# Patient Record
Sex: Female | Born: 1935 | Race: White | Hispanic: No | Marital: Married | Smoking: Former smoker
Health system: Southern US, Community
[De-identification: ages and names within clinical notes are randomized; demographics above are authoritative.]

## PROBLEM LIST (undated history)

## (undated) DIAGNOSIS — R413 Other amnesia: Secondary | ICD-10-CM

## (undated) DIAGNOSIS — F0280 Dementia in other diseases classified elsewhere without behavioral disturbance: Secondary | ICD-10-CM

## (undated) DIAGNOSIS — M1990 Unspecified osteoarthritis, unspecified site: Secondary | ICD-10-CM

## (undated) DIAGNOSIS — R55 Syncope and collapse: Secondary | ICD-10-CM

## (undated) DIAGNOSIS — J189 Pneumonia, unspecified organism: Secondary | ICD-10-CM

## (undated) DIAGNOSIS — C50912 Malignant neoplasm of unspecified site of left female breast: Secondary | ICD-10-CM

## (undated) DIAGNOSIS — N6310 Unspecified lump in the right breast, unspecified quadrant: Secondary | ICD-10-CM

## (undated) DIAGNOSIS — G309 Alzheimer's disease, unspecified: Secondary | ICD-10-CM

## (undated) HISTORY — PX: APPENDECTOMY: SHX54

---

## 1959-02-07 HISTORY — PX: MASTECTOMY: SHX3

## 1999-02-07 HISTORY — PX: CHOLECYSTECTOMY: SHX55

## 2010-01-21 ENCOUNTER — Encounter: Admission: RE | Admit: 2010-01-21 | Discharge: 2010-01-21 | Payer: Self-pay | Admitting: Family Medicine

## 2010-01-29 ENCOUNTER — Encounter: Admission: RE | Admit: 2010-01-29 | Discharge: 2010-01-29 | Payer: Self-pay | Admitting: Family Medicine

## 2012-02-09 DIAGNOSIS — Z23 Encounter for immunization: Secondary | ICD-10-CM | POA: Diagnosis not present

## 2012-02-09 DIAGNOSIS — Z0000 Encounter for general adult medical examination without abnormal findings: Secondary | ICD-10-CM | POA: Diagnosis not present

## 2012-02-09 DIAGNOSIS — R413 Other amnesia: Secondary | ICD-10-CM | POA: Diagnosis not present

## 2012-02-09 DIAGNOSIS — Z853 Personal history of malignant neoplasm of breast: Secondary | ICD-10-CM | POA: Diagnosis not present

## 2012-02-09 DIAGNOSIS — R030 Elevated blood-pressure reading, without diagnosis of hypertension: Secondary | ICD-10-CM | POA: Diagnosis not present

## 2012-02-10 ENCOUNTER — Other Ambulatory Visit: Payer: Self-pay | Admitting: Family Medicine

## 2012-02-10 DIAGNOSIS — Z1231 Encounter for screening mammogram for malignant neoplasm of breast: Secondary | ICD-10-CM

## 2012-02-11 DIAGNOSIS — Z0000 Encounter for general adult medical examination without abnormal findings: Secondary | ICD-10-CM | POA: Diagnosis not present

## 2012-02-11 DIAGNOSIS — Z1211 Encounter for screening for malignant neoplasm of colon: Secondary | ICD-10-CM | POA: Diagnosis not present

## 2012-02-19 DIAGNOSIS — R413 Other amnesia: Secondary | ICD-10-CM | POA: Diagnosis not present

## 2012-02-19 DIAGNOSIS — Z23 Encounter for immunization: Secondary | ICD-10-CM | POA: Diagnosis not present

## 2012-03-02 ENCOUNTER — Ambulatory Visit
Admission: RE | Admit: 2012-03-02 | Discharge: 2012-03-02 | Disposition: A | Discharge status: Home / Self Care | Payer: Medicare Other | Source: Ambulatory Visit | Attending: Family Medicine | Admitting: Family Medicine

## 2012-03-02 ENCOUNTER — Other Ambulatory Visit: Payer: Self-pay | Admitting: Family Medicine

## 2012-03-02 DIAGNOSIS — Z1231 Encounter for screening mammogram for malignant neoplasm of breast: Secondary | ICD-10-CM

## 2012-03-02 DIAGNOSIS — Z853 Personal history of malignant neoplasm of breast: Secondary | ICD-10-CM | POA: Diagnosis not present

## 2012-03-08 ENCOUNTER — Other Ambulatory Visit: Payer: Self-pay | Admitting: Family Medicine

## 2012-03-08 DIAGNOSIS — R928 Other abnormal and inconclusive findings on diagnostic imaging of breast: Secondary | ICD-10-CM

## 2012-03-10 DIAGNOSIS — F0390 Unspecified dementia without behavioral disturbance: Secondary | ICD-10-CM | POA: Diagnosis not present

## 2012-03-11 ENCOUNTER — Other Ambulatory Visit: Payer: Self-pay | Admitting: Family Medicine

## 2012-03-11 ENCOUNTER — Ambulatory Visit
Admission: RE | Admit: 2012-03-11 | Discharge: 2012-03-11 | Disposition: A | Discharge status: Home / Self Care | Payer: Medicare Other | Source: Ambulatory Visit | Attending: Family Medicine | Admitting: Family Medicine

## 2012-03-11 DIAGNOSIS — R928 Other abnormal and inconclusive findings on diagnostic imaging of breast: Secondary | ICD-10-CM

## 2012-03-11 DIAGNOSIS — N6310 Unspecified lump in the right breast, unspecified quadrant: Secondary | ICD-10-CM

## 2012-03-17 ENCOUNTER — Emergency Department (HOSPITAL_COMMUNITY): Payer: Medicare Other

## 2012-03-17 ENCOUNTER — Observation Stay (HOSPITAL_COMMUNITY): Payer: Medicare Other

## 2012-03-17 ENCOUNTER — Encounter (HOSPITAL_COMMUNITY): Payer: Self-pay | Admitting: *Deleted

## 2012-03-17 ENCOUNTER — Other Ambulatory Visit: Payer: Self-pay

## 2012-03-17 ENCOUNTER — Inpatient Hospital Stay (HOSPITAL_COMMUNITY)
Admission: EM | Admit: 2012-03-17 | Discharge: 2012-03-24 | DRG: 315 | Disposition: A | Discharge status: Skilled Nursing Facility | Payer: Medicare Other | Attending: Internal Medicine | Admitting: Internal Medicine

## 2012-03-17 DIAGNOSIS — R404 Transient alteration of awareness: Secondary | ICD-10-CM | POA: Diagnosis not present

## 2012-03-17 DIAGNOSIS — I959 Hypotension, unspecified: Secondary | ICD-10-CM | POA: Diagnosis present

## 2012-03-17 DIAGNOSIS — K5900 Constipation, unspecified: Secondary | ICD-10-CM | POA: Diagnosis present

## 2012-03-17 DIAGNOSIS — I6789 Other cerebrovascular disease: Secondary | ICD-10-CM | POA: Diagnosis not present

## 2012-03-17 DIAGNOSIS — I9589 Other hypotension: Principal | ICD-10-CM | POA: Diagnosis present

## 2012-03-17 DIAGNOSIS — E876 Hypokalemia: Secondary | ICD-10-CM | POA: Diagnosis present

## 2012-03-17 DIAGNOSIS — F0390 Unspecified dementia without behavioral disturbance: Secondary | ICD-10-CM | POA: Diagnosis not present

## 2012-03-17 DIAGNOSIS — E46 Unspecified protein-calorie malnutrition: Secondary | ICD-10-CM | POA: Diagnosis present

## 2012-03-17 DIAGNOSIS — J449 Chronic obstructive pulmonary disease, unspecified: Secondary | ICD-10-CM | POA: Diagnosis not present

## 2012-03-17 DIAGNOSIS — Z853 Personal history of malignant neoplasm of breast: Secondary | ICD-10-CM

## 2012-03-17 DIAGNOSIS — R42 Dizziness and giddiness: Secondary | ICD-10-CM | POA: Diagnosis not present

## 2012-03-17 DIAGNOSIS — R531 Weakness: Secondary | ICD-10-CM

## 2012-03-17 DIAGNOSIS — N63 Unspecified lump in unspecified breast: Secondary | ICD-10-CM | POA: Diagnosis present

## 2012-03-17 DIAGNOSIS — M6281 Muscle weakness (generalized): Secondary | ICD-10-CM | POA: Diagnosis present

## 2012-03-17 DIAGNOSIS — R55 Syncope and collapse: Secondary | ICD-10-CM | POA: Diagnosis not present

## 2012-03-17 DIAGNOSIS — Z7982 Long term (current) use of aspirin: Secondary | ICD-10-CM

## 2012-03-17 DIAGNOSIS — F29 Unspecified psychosis not due to a substance or known physiological condition: Secondary | ICD-10-CM | POA: Diagnosis not present

## 2012-03-17 DIAGNOSIS — R109 Unspecified abdominal pain: Secondary | ICD-10-CM

## 2012-03-17 DIAGNOSIS — N6310 Unspecified lump in the right breast, unspecified quadrant: Secondary | ICD-10-CM

## 2012-03-17 DIAGNOSIS — R5381 Other malaise: Secondary | ICD-10-CM | POA: Diagnosis not present

## 2012-03-17 DIAGNOSIS — R638 Other symptoms and signs concerning food and fluid intake: Secondary | ICD-10-CM | POA: Diagnosis present

## 2012-03-17 DIAGNOSIS — Z79899 Other long term (current) drug therapy: Secondary | ICD-10-CM

## 2012-03-17 DIAGNOSIS — Z9010 Acquired absence of unspecified breast and nipple: Secondary | ICD-10-CM

## 2012-03-17 HISTORY — DX: Syncope and collapse: R55

## 2012-03-17 HISTORY — DX: Malignant neoplasm of unspecified site of left female breast: C50.912

## 2012-03-17 HISTORY — DX: Other amnesia: R41.3

## 2012-03-17 HISTORY — DX: Unspecified osteoarthritis, unspecified site: M19.90

## 2012-03-17 HISTORY — DX: Dementia in other diseases classified elsewhere, unspecified severity, without behavioral disturbance, psychotic disturbance, mood disturbance, and anxiety: F02.80

## 2012-03-17 HISTORY — DX: Alzheimer's disease, unspecified: G30.9

## 2012-03-17 HISTORY — DX: Pneumonia, unspecified organism: J18.9

## 2012-03-17 HISTORY — DX: Unspecified lump in the right breast, unspecified quadrant: N63.10

## 2012-03-17 LAB — URINALYSIS, ROUTINE W REFLEX MICROSCOPIC
Bilirubin Urine: NEGATIVE
Glucose, UA: NEGATIVE mg/dL
Ketones, ur: NEGATIVE mg/dL
Leukocytes, UA: NEGATIVE
Urobilinogen, UA: 1 mg/dL (ref 0.0–1.0)
pH: 8 (ref 5.0–8.0)

## 2012-03-17 LAB — CBC WITH DIFFERENTIAL/PLATELET
Eosinophils Absolute: 0.1 10*3/uL (ref 0.0–0.7)
Lymphocytes Relative: 20 % (ref 12–46)
MCH: 29.9 pg (ref 26.0–34.0)
MCHC: 33.7 g/dL (ref 30.0–36.0)
MCV: 88.9 fL (ref 78.0–100.0)
Monocytes Absolute: 0.6 10*3/uL (ref 0.1–1.0)
Monocytes Relative: 9 % (ref 3–12)
Neutro Abs: 4.6 10*3/uL (ref 1.7–7.7)
Neutrophils Relative %: 69 % (ref 43–77)
RBC: 4.58 MIL/uL (ref 3.87–5.11)

## 2012-03-17 LAB — APTT: aPTT: 26 seconds (ref 24–37)

## 2012-03-17 LAB — LIPID PANEL
Cholesterol: 195 mg/dL (ref 0–200)
Total CHOL/HDL Ratio: 2.6 RATIO

## 2012-03-17 LAB — COMPREHENSIVE METABOLIC PANEL
AST: 16 U/L (ref 0–37)
CO2: 28 mEq/L (ref 19–32)
GFR calc Af Amer: >90 mL/min (ref >90)
GFR calc non Af Amer: 78 mL/min — ABNORMAL LOW (ref >90)
Glucose, Bld: 107 mg/dL — ABNORMAL HIGH (ref 70–99)
Total Protein: 6.7 g/dL (ref 6.0–8.3)

## 2012-03-17 LAB — URINE MICROSCOPIC-ADD ON

## 2012-03-17 LAB — HEMOGLOBIN A1C
Hgb A1c MFr Bld: 5.6 %
Mean Plasma Glucose: 114 mg/dL

## 2012-03-17 LAB — PROTIME-INR: INR: 0.95 (ref 0.00–1.49)

## 2012-03-17 LAB — GLUCOSE, CAPILLARY: Glucose-Capillary: 197 mg/dL — ABNORMAL HIGH (ref 70–99)

## 2012-03-17 MED ORDER — POTASSIUM CHLORIDE CRYS ER 20 MEQ PO TBCR
40.0000 meq | EXTENDED_RELEASE_TABLET | Freq: Once | ORAL | Status: AC
  Administered 2012-03-17: 40 meq via ORAL
  Filled 2012-03-17: qty 2

## 2012-03-17 MED ORDER — ADULT MULTIVITAMIN W/MINERALS CH
1.0000 | ORAL_TABLET | Freq: Every day | ORAL | Status: DC
  Administered 2012-03-17 – 2012-03-24 (×6): 1 via ORAL
  Filled 2012-03-17 (×8): qty 1

## 2012-03-17 MED ORDER — CALCIUM CARBONATE-VITAMIN D 500-200 MG-UNIT PO TABS
1.0000 | ORAL_TABLET | Freq: Every day | ORAL | Status: DC
  Administered 2012-03-17 – 2012-03-24 (×6): 1 via ORAL
  Filled 2012-03-17 (×8): qty 1

## 2012-03-17 MED ORDER — SODIUM CHLORIDE 0.9 % IJ SOLN
3.0000 mL | Freq: Two times a day (BID) | INTRAMUSCULAR | Status: DC
  Administered 2012-03-17 – 2012-03-24 (×8): 3 mL via INTRAVENOUS

## 2012-03-17 MED ORDER — LORAZEPAM 2 MG/ML IJ SOLN
1.0000 mg | Freq: Once | INTRAMUSCULAR | Status: AC
  Administered 2012-03-17: 1 mg via INTRAVENOUS
  Filled 2012-03-17: qty 1

## 2012-03-17 MED ORDER — SODIUM CHLORIDE 0.9 % IV SOLN
INTRAVENOUS | Status: DC
Start: 2012-03-17 — End: 2012-03-19
  Administered 2012-03-17 – 2012-03-18 (×3): via INTRAVENOUS

## 2012-03-17 MED ORDER — SODIUM CHLORIDE 0.9 % IV BOLUS (SEPSIS)
1000.0000 mL | Freq: Once | INTRAVENOUS | Status: AC
  Administered 2012-03-17: 1000 mL via INTRAVENOUS

## 2012-03-17 MED ORDER — ASPIRIN EC 81 MG PO TBEC
81.0000 mg | DELAYED_RELEASE_TABLET | Freq: Every day | ORAL | Status: DC
  Administered 2012-03-17 – 2012-03-24 (×7): 81 mg via ORAL
  Filled 2012-03-17 (×8): qty 1

## 2012-03-17 MED ORDER — MEMANTINE HCL ER 28 MG PO CP24
28.0000 mg | ORAL_CAPSULE | Freq: Every day | ORAL | Status: DC
  Administered 2012-03-17 – 2012-03-18 (×2): 28 mg via ORAL
  Filled 2012-03-17: qty 1

## 2012-03-17 MED ORDER — SODIUM CHLORIDE 0.9 % IV SOLN
INTRAVENOUS | Status: AC
  Administered 2012-03-17: 19:00:00 via INTRAVENOUS

## 2012-03-17 MED ORDER — ENOXAPARIN SODIUM 40 MG/0.4ML ~~LOC~~ SOLN
40.0000 mg | SUBCUTANEOUS | Status: DC
  Administered 2012-03-17 – 2012-03-23 (×7): 40 mg via SUBCUTANEOUS
  Filled 2012-03-17 (×8): qty 0.4

## 2012-03-17 NOTE — ED Notes (Signed)
Husband reported to EMS that Pt was normal this am when waking up. Pt became weak as she was fixing breakfast . Pt has a hx of dementia and takes Namenda daily.

## 2012-03-17 NOTE — ED Provider Notes (Signed)
History     CSN: [id number]  Arrival date & time 03/17/12  4098   First MD Initiated Contact with Patient 03/17/12 [phone]      No chief complaint on file.   (Consider location/radiation/quality/duration/timing/severity/associated sxs/prior treatment) HPI Comments: The patient is an 78 year old woman who was preparing her tea for breakfast. She went down, leaning over the counter. Apparently she never completely lost consciousness. There was some shaking in her hands. There is no incontinence. She had no prior similar episode. She is a patient who has a prior history of breast cancer some 40 years ago. She is on Namenda for early dementia.  Patient is a 78 y.o. female presenting with syncope. The history is provided by the spouse. No language interpreter was used.  Loss of Consciousness This is a new problem. The current episode started less than 1 hour ago. The problem occurs rarely. The problem has been rapidly worsening. Associated symptoms include headaches. Pertinent negatives include no chest pain, no abdominal pain and no shortness of breath. Nothing aggravates the symptoms. Nothing relieves the symptoms. Treatments tried: Pt transported to [facility] ED via EMS.     No past medical history on file.  No past surgical history on file.  No family history on file.  History  Substance Use Topics  . Smoking status: Not on file  . Smokeless tobacco: Not on file  . Alcohol Use: Not on file    OB History    No data available      Review of Systems  Unable to perform ROS: Dementia  Respiratory: Negative for shortness of breath.   Cardiovascular: Positive for syncope. Negative for chest pain.  Gastrointestinal: Negative for abdominal pain.  Neurological: Positive for headaches.    Allergies  Review of patient's allergies indicates no known allergies.  Home Medications  No current outpatient prescriptions on file.  BP 144/72  Pulse 71  Temp 98.1 F (36.7 C) (Oral)   Resp 22  SpO2 100%  Physical Exam  Nursing note and vitals reviewed. Constitutional: She appears well-developed and well-nourished.       Elderly woman, seems somewhat confused, Not able to give her history.  HENT:  Head: Normocephalic and atraumatic.  Right Ear: External ear normal.  Left Ear: External ear normal.  Mouth/Throat: Oropharynx is clear and moist.  Eyes: Conjunctivae normal and EOM are normal. Pupils are equal, round, and reactive to light.  Neck: Normal range of motion. Neck supple.       No carotid bruit.  Cardiovascular: Normal rate, regular rhythm and normal heart sounds.   Pulmonary/Chest: Effort normal and breath sounds normal.  Abdominal: Soft. Bowel sounds are normal.  Musculoskeletal: Normal range of motion. She exhibits no edema and no tenderness.  Neurological: She is alert.       No sensory or motor deficit.  Pt answers simple questions, otherwise lies silently.  Skin: Skin is warm and dry.  Psychiatric: She has a normal mood and affect. Her behavior is normal.    ED Course  Procedures (including critical care time)  9:04 AM  Date: 03/17/2012  Rate: 69  Rhythm: normal sinus rhythm  QRS Axis: left  Intervals: QT prolonged  ST/T Wave abnormalities: normal  Conduction Disutrbances:left bundle branch block  Narrative Interpretation: Abnormal EKG.  Old EKG Reviewed: none available   9:25 AM Pt seen --> physical exam performed.  Lab workup ordered.  Results for orders placed during the hospital encounter of 03/17/12  CBC WITH DIFFERENTIAL  Component Value Range   WBC 6.7  4.0 - 10.5 K/uL   RBC 4.58  3.87 - 5.11 MIL/uL   Hemoglobin 13.7  12.0 - 15.0 g/dL   HCT 16.1  09.6 - 04.5 %   MCV 88.9  78.0 - 100.0 fL   MCH 29.9  26.0 - 34.0 pg   MCHC 33.7  30.0 - 36.0 g/dL   RDW 40.9  81.1 - 91.4 %   Platelets 253  150 - 400 K/uL   Neutrophils Relative 69  43 - 77 %   Neutro Abs 4.6  1.7 - 7.7 K/uL   Lymphocytes Relative 20  12 - 46 %   Lymphs Abs  1.3  0.7 - 4.0 K/uL   Monocytes Relative 9  3 - 12 %   Monocytes Absolute 0.6  0.1 - 1.0 K/uL   Eosinophils Relative 1  0 - 5 %   Eosinophils Absolute 0.1  0.0 - 0.7 K/uL   Basophils Relative 1  0 - 1 %   Basophils Absolute 0.0  0.0 - 0.1 K/uL  COMPREHENSIVE METABOLIC PANEL      Component Value Range   Sodium 142  135 - 145 mEq/L   Potassium 3.4 (*) 3.5 - 5.1 mEq/L   Chloride 104  96 - 112 mEq/L   CO2 28  19 - 32 mEq/L   Glucose, Bld 107 (*) 70 - 99 mg/dL   BUN 11  6 - 23 mg/dL   Creatinine, Ser 7.82  0.50 - 1.10 mg/dL   Calcium 9.5  8.4 - 95.6 mg/dL   Total Protein 6.7  6.0 - 8.3 g/dL   Albumin 3.6  3.5 - 5.2 g/dL   AST 16  0 - 37 U/L   ALT 11  0 - 35 U/L   Alkaline Phosphatase 78  39 - 117 U/L   Total Bilirubin 0.6  0.3 - 1.2 mg/dL   GFR calc non Af Amer 78 (*) >90 mL/min   GFR calc Af Amer >90  >90 mL/min  URINALYSIS, ROUTINE W REFLEX MICROSCOPIC      Component Value Range   Color, Urine YELLOW  YELLOW   APPearance CLEAR  CLEAR   Specific Gravity, Urine 1.010  1.005 - 1.030   pH 8.0  5.0 - 8.0   Glucose, UA NEGATIVE  NEGATIVE mg/dL   Hgb urine dipstick TRACE (*) NEGATIVE   Bilirubin Urine NEGATIVE  NEGATIVE   Ketones, ur NEGATIVE  NEGATIVE mg/dL   Protein, ur NEGATIVE  NEGATIVE mg/dL   Urobilinogen, UA 1.0  0.0 - 1.0 mg/dL   Nitrite NEGATIVE  NEGATIVE   Leukocytes, UA NEGATIVE  NEGATIVE  APTT      Component Value Range   aPTT 26  24 - 37 seconds  PROTIME-INR      Component Value Range   Prothrombin Time 12.6  11.6 - 15.2 seconds   INR 0.95  0.00 - 1.49  URINE MICROSCOPIC-ADD ON      Component Value Range   Squamous Epithelial / LPF RARE  RARE   WBC, UA 0-2  <3 WBC/hpf   RBC / HPF 0-2  <3 RBC/hpf   Dg Chest 2 View  03/17/2012  *RADIOLOGY REPORT*  Clinical Data: Dizziness.  Near syncope.  CHEST - 2 VIEW  Comparison: None  Findings: Mild hyperinflation of the lungs.  Biapical densities, likely scarring.  Heart is upper limits normal in size.  No acute opacities  or effusions.  No acute bony abnormality.  IMPRESSION: COPD/chronic changes.  No active disease.   Original Report Authenticated By: Cyndie Chime, M.D.    Ct Head Wo Contrast  03/17/2012  *RADIOLOGY REPORT*  Clinical Data: Near syncope and confusion  CT HEAD WITHOUT CONTRAST  Technique:  Contiguous axial images were obtained from the base of the skull through the vertex without contrast. Study was obtained within 24 hours of patient arrival at the emergency department.  Comparison: None.  Findings: There is age-related volume loss.  There is no mass, hemorrhage, extra-axial fluid collection, or midline shift.  There is patchy small vessel disease throughout the centra semiovale bilaterally. There is decreased attenuation medial to the sylvian fissure on the left which appears asymmetric compared the right.  This finding may represent a small recent infarct.  No other evidence suggesting recent infarct seen.  Bony calvarium appears intact.  Mastoid air cells are clear.  IMPRESSION: Moderate supratentorial small vessel disease. Question recent small infarct on the left slightly medial to the sylvian fissure. No other findings suspicious for recent infarct.  No hemorrhage or mass effect.   Original Report Authenticated By: Arvin Collard. WOODRUFF III, M.D.      1. Near syncope     DISP:  Admitted for observation post near syncope.      Carleene Cooper III, MD 03/17/12 2107

## 2012-03-17 NOTE — H&P (Signed)
[facility] History and Physical  Laura Colon ZOX:[id number] DOB: March 23, 1924 DOA: 03/17/2012  Referring physician: Dr. Ignacia Palma PCP: Astrid Divine, MD   Chief Complaint: Near syncope x 1 day  Limited History obtained from patient's husband and her  PCP  HPI:  78 year old female with history off breast cancer about 40 years ago status post left mastectomy with what appears like progressive dementia over past few months was brought in by her husband for a near-syncopal event this morning while she was preparing tea at home. As per the husband she was in the kitchen and became shaky and slumped on the ground without hitting her head or passing out. He then called 911 and brought her to the hospital. Patient has no recollection of the event and is very demented and completely not oriented. On discussing with her PCP Dr. Valentina Lucks patient was seen about 6 week back for the first time for progressive dementia and she has started her on Namenda. She also had a mammogram done which was suggestive of calcifications and a right breast ultrasound was done which showed a suspicious  mass was recommended for a core biopsy which and the husband refused. Patient denies any chest pain, palpitations, shortness of breath, headache, dizziness, abdominal pain, nausea, vomiting, bowel or urinary symptoms. As per her husband she is usually home bound and is able to perform most of her ADLs. As per the PCP's instructions she has driving for past few weeks. He denies patient having any other medical illnesses and being on any other medications. The ED her vitals were stable at work was unremarkable. A chest x-ray obtained was unremarkable. UA was normal. EKG showed left bundle branch block but  no old EKG was available for comparison. A head CT showed some microvascular disease with a possible recent left-sided small infarct. Hospitalist called in to admit patient under observation.   Review of  Systems:  As outlined in history of present illness otherwise review of systems rated it to patient's dementia and her husband being a poor historian   Past Medical History  Diagnosis Date  . Dementia    Surgical history: History of left mastectomy 40 years ago.  Social History:  does not have a smoking history on file. She does not have any smokeless tobacco history on file. Her alcohol and drug histories not on file.  No Known Allergies  No family history on file.  Prior to Admission medications   Medication Sig Start Date End Date Taking? Authorizing Provider  calcium-vitamin D (OSCAL WITH D) 500-200 MG-UNIT per tablet Take 1 tablet by mouth daily.   Yes Historical Provider, MD  Memantine HCl ER (NAMENDA XR) 28 MG CP24 Take 28 mg by mouth daily.   Yes Historical Provider, MD  Multiple Vitamin (MULTIVITAMIN WITH MINERALS) TABS Take 1 tablet by mouth daily.   Yes Historical Provider, MD    Physical Exam:  Filed Vitals:   03/17/12 0837 03/17/12 1213  BP: 144/72 133/66  Pulse: 71 68  Temp: 98.1 F (36.7 C)   TempSrc: Oral   Resp: 22 18  SpO2: 100% 96%    Constitutional: Vital signs reviewed.  Patient is a well-developed and well-nourished in no acute distress but extremely demented. Head: Normocephalic and atraumatic Ear: TM normal bilaterally Mouth: no erythema or exudates, MMM Eyes: PERRL, EOMI, conjunctivae normal, No scleral icterus.  Neck: Supple, Trachea midline normal ROM, No JVD, mass, thyromegaly, or carotid bruit present.  Cardiovascular: RRR, S1 normal, S2 normal, no MRG,  pulses symmetric and intact bilaterally Pulmonary/Chest: CTAB, no wheezes, rales, or rhonchi Abdominal: Soft. Non-tender, non-distended, bowel sounds are normal, no masses, organomegaly, or guarding present.  GU: no CVA tenderness Musculoskeletal: No joint deformities, erythema, or stiffness, ROM full and no nontender Ext: no edema and no cyanosis, pulses palpable bilaterally (DP and  PT) Hematology: no cervical, inginal, or axillary adenopathy.  Neurological: A&O x1 (oriented to self only), Strenght is normal and symmetric bilaterally, cranial nerve II-XII are grossly intact, no focal motor deficit, sensory intact to light touch bilaterally.  Skin: Warm, dry and intact. No rash, cyanosis, or clubbing.  Psychiatric: Normal mood and affect. speech and behavior is normal. Judgment and thought content poor Cognition and memory are very poor.  Labs on Admission:  Basic Metabolic Panel:  Lab 03/17/12 7829  NA 142  K 3.4*  CL 104  CO2 28  GLUCOSE 107*  BUN 11  CREATININE 0.65  CALCIUM 9.5  MG --  PHOS --   Liver Function Tests:  Lab 03/17/12 0942  AST 16  ALT 11  ALKPHOS 78  BILITOT 0.6  PROT 6.7  ALBUMIN 3.6   No results found for this basename: LIPASE:5,AMYLASE:5 in the last 168 hours No results found for this basename: AMMONIA:5 in the last 168 hours CBC:  Lab 03/17/12 0942  WBC 6.7  NEUTROABS 4.6  HGB 13.7  HCT 40.7  MCV 88.9  PLT 253   Cardiac Enzymes: No results found for this basename: CKTOTAL:5,CKMB:5,CKMBINDEX:5,TROPONINI:5 in the last 168 hours BNP: No components found with this basename: POCBNP:5 CBG: No results found for this basename: GLUCAP:5 in the last 168 hours  Radiological Exams on Admission: Dg Chest 2 View  03/17/2012  *RADIOLOGY REPORT*  Clinical Data: Dizziness.  Near syncope.  CHEST - 2 VIEW  Comparison: None  Findings: Mild hyperinflation of the lungs.  Biapical densities, likely scarring.  Heart is upper limits normal in size.  No acute opacities or effusions.  No acute bony abnormality.  IMPRESSION: COPD/chronic changes.  No active disease.   Original Report Authenticated By: Cyndie Chime, M.D.    Ct Head Wo Contrast  03/17/2012  *RADIOLOGY REPORT*  Clinical Data: Near syncope and confusion  CT HEAD WITHOUT CONTRAST  Technique:  Contiguous axial images were obtained from the base of the skull through the vertex  without contrast. Study was obtained within 24 hours of patient arrival at the emergency department.  Comparison: None.  Findings: There is age-related volume loss.  There is no mass, hemorrhage, extra-axial fluid collection, or midline shift.  There is patchy small vessel disease throughout the centra semiovale bilaterally. There is decreased attenuation medial to the sylvian fissure on the left which appears asymmetric compared the right.  This finding may represent a small recent infarct.  No other evidence suggesting recent infarct seen.  Bony calvarium appears intact.  Mastoid air cells are clear.  IMPRESSION: Moderate supratentorial small vessel disease. Question recent small infarct on the left slightly medial to the sylvian fissure. No other findings suspicious for recent infarct.  No hemorrhage or mass effect.   Original Report Authenticated By: Arvin Collard. WOODRUFF III, M.D.     EKG: Normal sinus rhythm with left bundle branch block. No old EKG to compare  Assessment/Plan Principal Problem:  *Near syncope Very limited history as patient is severely demented and husband is not able to provide good information. Admission vitals are stable. She does not have any neurological deficit on my exam. Be vasovagal versus orthostatic.  A head CT shows a possible left-sided recent small infarct. Patient does not have any documented medical history except for progressive dementia. I will admit her her under observation in telemetry. Given her history of breast cancer and a positive recent mammogram I will get an MRI and MRA of the brain to evaluate for any meds and rule out a possible CVA. -Will start her on baby aspirin -We'll check a lipid panel and hemoglobin A1c. -Check orthostatic vitals, Neurochecks and PT eval. -We'll plan on further workup based on MRI results. On discussion with the husband he is unclear about how aggressive he wants to be with workups. -Continue Namenda for dementia.   1. DVT  prophylaxis-subcutaneous Lovenox  Code Status: Full code Family Communication: Discussed with husband at bedside  Disposition Plan: Admit under observation overnight possibly discharged in a.m if no further workup needed.  Time spent on admission: 70 minutes  Charelle Petrakis [facility] Pager [phone]  If 7PM-7AM, please contact night-coverage [email or web address] Password [email or web address] 03/17/2012, 1:39 PM

## 2012-03-18 DIAGNOSIS — R55 Syncope and collapse: Secondary | ICD-10-CM | POA: Diagnosis not present

## 2012-03-18 DIAGNOSIS — R531 Weakness: Secondary | ICD-10-CM | POA: Diagnosis present

## 2012-03-18 DIAGNOSIS — E876 Hypokalemia: Secondary | ICD-10-CM

## 2012-03-18 DIAGNOSIS — N6310 Unspecified lump in the right breast, unspecified quadrant: Secondary | ICD-10-CM | POA: Diagnosis present

## 2012-03-18 DIAGNOSIS — I959 Hypotension, unspecified: Secondary | ICD-10-CM

## 2012-03-18 DIAGNOSIS — F0390 Unspecified dementia without behavioral disturbance: Secondary | ICD-10-CM | POA: Diagnosis not present

## 2012-03-18 LAB — BASIC METABOLIC PANEL
CO2: 25 mEq/L (ref 19–32)
CO2: 26 mEq/L (ref 19–32)
Calcium: 9.1 mg/dL (ref 8.4–10.5)
Chloride: 107 mEq/L (ref 96–112)
Creatinine, Ser: 0.64 mg/dL (ref 0.50–1.10)
GFR calc non Af Amer: 79 mL/min — ABNORMAL LOW (ref >90)
Glucose, Bld: 113 mg/dL — ABNORMAL HIGH (ref 70–99)
Potassium: 3.9 mEq/L (ref 3.5–5.1)
Sodium: 141 mEq/L (ref 135–145)
Sodium: 140 mEq/L (ref 135–145)

## 2012-03-18 LAB — TROPONIN I
Troponin I: <0.3 ng/mL (ref <0.30)
Troponin I: <0.3 ng/mL (ref <0.30)

## 2012-03-18 LAB — CBC
HCT: 39.3 % (ref 36.0–46.0)
Hemoglobin: 12.9 g/dL (ref 12.0–15.0)
MCH: 29.2 pg (ref 26.0–34.0)
MCH: 29.2 pg (ref 26.0–34.0)
MCV: 88.9 fL (ref 78.0–100.0)
MCV: 89.3 fL (ref 78.0–100.0)
Platelets: 228 10*3/uL (ref 150–400)
RBC: 4.42 MIL/uL (ref 3.87–5.11)
RBC: 4.31 MIL/uL (ref 3.87–5.11)
RDW: 14 % (ref 11.5–15.5)
WBC: 10.4 10*3/uL (ref 4.0–10.5)
WBC: 9.5 10*3/uL (ref 4.0–10.5)

## 2012-03-18 LAB — URINE CULTURE: Colony Count: NO GROWTH

## 2012-03-18 LAB — TSH: TSH: 1.376 u[IU]/mL (ref 0.350–4.500)

## 2012-03-18 MED ORDER — POLYETHYLENE GLYCOL 3350 17 G PO PACK
17.0000 g | PACK | Freq: Every day | ORAL | Status: DC
  Administered 2012-03-18: 17 g via ORAL
  Filled 2012-03-18 (×2): qty 1

## 2012-03-18 MED ORDER — DOCUSATE SODIUM 100 MG PO CAPS
100.0000 mg | ORAL_CAPSULE | Freq: Two times a day (BID) | ORAL | Status: DC | PRN
  Filled 2012-03-18: qty 1

## 2012-03-18 NOTE — Progress Notes (Signed)
UR done. 

## 2012-03-18 NOTE — Progress Notes (Signed)
Chaplain responded to spiritual consult issued by [id number] nurse last evening. The request was for "prayer." I introduced myself to pt and to her son who was seated at bedside. Pt requested I come back later, and I will.

## 2012-03-18 NOTE — Evaluation (Signed)
Physical Therapy Evaluation Patient Details Name: Laura Colon MRN: [id number] DOB: [birth year] Today's Date: 03/18/2012 Time: 7829-5621 PT Time Calculation (min): 29 min  PT Assessment / Plan / Recommendation Clinical Impression  Pt admitted with near syncopal episode. Spoke with daughter who states that since her mom has started this new medical, she has physically declined a significant amount and used to be quick on her feet without any problems. Pt will benefit from skilled PT in the acute care setting in order to maximze functional mobility and safety for a safe d/c home with family.     PT Assessment  Patient needs continued PT services    Follow Up Recommendations  Home health PT;Supervision/Assistance - 24 hour       Barriers to Discharge   daughter will be able to be there 24/7 initially    Equipment Recommendations  Rolling walker with 5" wheels       Frequency Min 3X/week    Precautions / Restrictions Precautions Precautions: Fall Restrictions Weight Bearing Restrictions: No   Pertinent Vitals/Pain No pain complaints.       Mobility  Bed Mobility Bed Mobility: Supine to Sit;Sitting - Scoot to Edge of Bed Supine to Sit: 4: Min assist;With rails Sitting - Scoot to Edge of Bed: 5: Supervision Details for Bed Mobility Assistance: VC for proper sequencing. Pt able to complete with only min assist for stability into sitting.  Transfers Transfers: Sit to Stand;Stand to Sit Sit to Stand: 4: Min assist;With upper extremity assist;From bed;From chair/3-in-1 Stand to Sit: 4: Min assist;With upper extremity assist;To chair/3-in-1 Details for Transfer Assistance: VC for proper hand placement and safety. Assist through trunk for stability as pt with slight knee bucking upon standing Ambulation/Gait Ambulation/Gait Assistance: 4: Min assist;2: Max Environmental consultant (Feet): 40 Feet Assistive device: Rolling walker Ambulation/Gait Assistance Details: Min  assist for majority of ambulation although max assist 4 times secondary to pt with buckling of knees requiring max assist to maintain balance. Pt describes as feeling weak in the knees.  Gait Pattern: Within Functional Limits Gait velocity: decreased gait speed    Shoulder Instructions     Exercises     PT Diagnosis: Difficulty walking;Generalized weakness  PT Problem List: Decreased strength;Decreased activity tolerance;Decreased balance;Decreased mobility;Decreased knowledge of use of DME;Decreased safety awareness;Decreased knowledge of precautions;Pain PT Treatment Interventions: DME instruction;Gait training;Stair training;Functional mobility training;Therapeutic activities;Therapeutic exercise;Balance training;Patient/family education   PT Goals Acute Rehab PT Goals PT Goal Formulation: With patient/family Time For Goal Achievement: 04/01/12 Potential to Achieve Goals: Fair Pt will go Sit to Stand: with modified independence PT Goal: Sit to Stand - Progress: Goal set today Pt will go Stand to Sit: with modified independence PT Goal: Stand to Sit - Progress: Goal set today Pt will Transfer Bed to Chair/Chair to Bed: with supervision PT Transfer Goal: Bed to Chair/Chair to Bed - Progress: Goal set today Pt will Ambulate: 51 - 150 feet;with supervision;with least restrictive assistive device PT Goal: Ambulate - Progress: Goal set today  Visit Information  Last PT Received On: 03/18/12 Assistance Needed: +2 (for chair follow)    Subjective Data      Prior Functioning  Home Living Lives With: Spouse Available Help at Discharge: Family;Available 24 hours/day (unsure of physical assist) Type of Home: House Home Access: Stairs to enter [employer] of Steps: 1 Entrance Stairs-Rails: None Home Layout: One level Bathroom Shower/Tub: Forensic scientist: Standard Bathroom Accessibility: Yes How Accessible: Accessible via walker Home Adaptive  Equipment: Straight  cane;Grab bars in shower;Quad cane Prior Function Level of Independence: Independent Able to Take Stairs?: Yes Driving: Yes Vocation: Retired Comments: puzzles, read books Communication Communication: No difficulties Dominant Hand: Right    Cognition  Overall Cognitive Status: History of cognitive impairments - at baseline Arousal/Alertness: Awake/alert Orientation Level: Disoriented to;Time Behavior During Session: [facility] for tasks performed    Extremity/Trunk Assessment Right Lower Extremity Assessment RLE ROM/Strength/Tone: Unable to fully assess;Due to impaired cognition;Deficits RLE ROM/Strength/Tone Deficits: Pt able to flex/extend knee although was unable to take any resistance, unknown if due to cognition. Pt not following MMT instructions.  Left Lower Extremity Assessment LLE ROM/Strength/Tone: Unable to fully assess;Due to impaired cognition;Deficits LLE ROM/Strength/Tone Deficits: Pt able to flex/extend knee although was unable to take any resistance, unknown if due to cognition. Pt not following MMT instructions.    Balance Balance Balance Assessed:  (see gait)  End of Session PT - End of Session Equipment Utilized During Treatment: Gait belt Activity Tolerance: Patient limited by fatigue;Treatment limited secondary to medical complications (Comment) Patient left: in chair;with call bell/phone within reach;with family/visitor present Nurse Communication: Mobility status    Milana Kidney 03/18/2012, 8:57 AM

## 2012-03-18 NOTE — Progress Notes (Signed)
03/17/12  2116 Patient received a 1 L bolus of 0.9% normal saline for BP of  90/59 and lethargy. Pt. also receiving 0.9% normal saline at a rate of  75 ml/hr intravenous fluid. Pt.has history of dementia/memory impairment. Her husband is at the bedside. 2030 prior to receiving the fluid bolus patient tried using the [facility] to have a bowel movement but was very unsteady with no output. 03/18/12 0021 Patient said she had to use the bathroom however when placed on the bedpan to urinate no output. Bladder scan was done and showed 231 ml of urine . 0117 patient said she had to use the bedpan again bedpan was given but no output. 0128 MD on call for floor coverage with [facility] was notified. Orders received for I&O cath. Patient was catheterized. Output was 700 mL from I&O cath.

## 2012-03-18 NOTE — Care Management Note (Signed)
    Page 1 of 1   03/20/2012     2:43:38 PM   CARE MANAGEMENT NOTE 03/20/2012  Patient:  Laura Colon, Laura Colon   Account Number:  [id number]  Date Initiated:  03/18/2012  Documentation initiated by:  CRAFT,TERRI  Subjective/Objective Assessment:   78 yo female admitted 03/17/12 with near syncope episode.     Action/Plan:   D/C when medically stable.   Anticipated DC Date:  03/21/2012   Anticipated DC Plan:    In-house referral  Clinical Social Worker      DC Planning Services  CM consult      [facility] Choice  DURABLE MEDICAL EQUIPMENT   Choice offered to / List presented to:     DME arranged  WALKER - [name]      DME agency  [facility]     HH arranged  HH-2 PT  HH-6 SOCIAL WORKER      HH agency  [facility]   Status of service:  Completed, signed off Medicare Important Message given?   (If response is "NO", the following Medicare IM given date fields will be blank) Date Medicare IM given:   Date Additional Medicare IM given:    Discharge Disposition:    Per UR Regulation:  Reviewed for med. necessity/level of care/duration of stay  If discussed at Long Length of Stay Meetings, dates discussed:    Comments:  03/20/12-1437-[name],RN,BSN [phone]      Noted orders for home health PT and social work. Also noted need for DME rolling walker. Spoke to patient regarding choice of agencies. [facility]. [name] at [facility] [phone]) notified of referral information. No other needs identified at present. NCM will continue to follow.  03/18/12, [name] RNC-MNN, BSN, [phone], CM received referral for DME.  [name] at [facility] contacted with order and confirmation received.

## 2012-03-18 NOTE — Clinical Social Work Psychosocial (Signed)
Clinical Social Work Department BRIEF PSYCHOSOCIAL ASSESSMENT 03/18/2012  Patient:  Laura Colon, Laura Colon     Account Number:  [id number]     Admit date:  03/17/2012  Clinical Social Worker:  Juliette Mangle  Date/Time:  03/18/2012 10:00 AM  Referred by:  RN  Date Referred:  03/18/2012 Referred for  ALF Placement   Other Referral:   Interview type:  Family Other interview type:   Patient was not oriented at time of discussion    PSYCHOSOCIAL DATA Living Status:  HUSBAND Admitted from facility:   Level of care:   Primary support name:  Lynn,Karen Primary support relationship to patient:  CHILD, ADULT Degree of support available:   Stong and vested    CURRENT CONCERNS Current Concerns  Post-Acute Placement   Other Concerns:    SOCIAL WORK ASSESSMENT / PLAN Clinical Social Worker received referral for the potential need for post acute placement at an ALF. CSW reviewed chart and met with patient's family outside of patient's room. Patient was not oriented 2x and was not able to participate in the discussion.  CSW introduced self, explained role, and discussed skilled nursing home placement VS. ALF placement with patient's family. CSW explained to patient's family that due to patient's status, family would have to privately pay for a SNF stay. CSW also reviewed ALF placement and recommended that the family look into applying for Medicaid and  start reviewing  facilities. CSW provided patient's family with information and resources to help with ALF placement. Patient's daughter reported that she is going to speak with her boss and other family members to determine how to move forward.  Patient's family understands that they may have to take patient home prior to ALF placement if they do not pay privately pay for SNF. CSW will continue to follow and will have the weekend CSW check in with the family.   Assessment/plan status:  Psychosocial Support/Ongoing Assessment of Needs Other  assessment/ plan:   Information/referral to community resources:   ALF placement guides    PATIENT'S/FAMILY'S RESPONSE TO PLAN OF CARE: Patient's family members were overwhelmed but were appreciative of information and support provided by CSW. CSW will continue to follow and will assist with d/c planning.    Sabino Niemann, MSW, [employer] [phone]

## 2012-03-18 NOTE — Progress Notes (Signed)
[facility] PROGRESS NOTE  Laura Colon ZOX:[id number] DOB: 01-30-1924 DOA: 03/17/2012 PCP: Astrid Divine, MD  Assessment/Plan:  #1 near-syncope Questionable etiology likely secondary to hypotension secondary to volume depletion. Patient with no signs or symptoms of infection. MRI of the head is negative for acute stroke. Patient with hypotension overnight which responded to IV fluids. We'll cycle cardiac enzymes every 6 hours x3. Will check a 2-D echo. We'll check a TSH. Follow.  #2 hypotension Likely secondary to volume depletion ; patient with no signs or symptoms of infection. MRI of the head was negative. We'll cycle cardiac enzymes every 6 hours x3. Check a 2-D echo. Continue IV fluids and follow. Blood pressure has responded to IV fluids.  #3 hypokalemia Repeat be met is pending.  #4 generalized weakness Family feels patient's generalized weakness likely secondary to patient recently been started on Namenda. Family request and Namenda be discontinued. May be secondary to problem #2 versus debility versus worsening dementia. Will cycle cardiac enzymes every 6 hours x3. Check a 2-D echo. Patient with no signs or symptoms of infection. Will discontinue Namenda. PT OT.  #5 dementia Family feels patient's condition may have worsened after being started on Namenda. Family request and Namenda be discontinued. Will discontinue Namenda for now. Will check a B12, RPR, RBC folate. Will have patient followup with PCP as outpatient of her Namenda for further evaluation and management.  #6 right breast mass Patient will likely benefit from a biopsy to determine what her right breast masses. Family initially refused biopsy as outpatient. Further followup with PCP as outpatient.  #7 prophylaxis Lovenox for DVT prophylaxis.  Code Status: Full Family Communication: Updated husband, daughter, son at bedside. Disposition Plan: Home with home health versus SNF when medically  stable   Consultants:  None  Procedures:  Chest x-ray 03/17/2012  Antibiotics:  None  HPI/Subjective: Patient with no complaints. Patient alert to self, place, thinks it is 67. Patient however knows with the President is. Events of hypotension noted overnight. Family with concerns of patient's sudden generalized weakness. Patient's husband son and daughter are concerned that Namenda may be causing patient's recent generalized weakness and some confusion and requested it to be discontinued.  Objective: Filed Vitals:   03/17/12 2029 03/18/12 0227 03/18/12 0700 03/18/12 1007  BP: 90/59 129/70 131/71 127/64  Pulse: 66 89 79 74  Temp: 97.5 F (36.4 C) 97.8 F (36.6 C) 97.9 F (36.6 C) 99 F (37.2 C)  TempSrc: Oral Oral Oral Oral  Resp: 19 18 18 18   Height:      Weight:   53.2 kg (117 lb 4.6 oz)   SpO2: 92% 97% 95% 100%    Intake/Output Summary (Last 24 hours) at 03/18/12 1025 Last data filed at 03/18/12 0950  Gross per 24 hour  Intake   1191 ml  Output   1802 ml  Net   -611 ml   Filed Weights   03/17/12 1429 03/18/12 0700  Weight: 53.6 kg (118 lb 2.7 oz) 53.2 kg (117 lb 4.6 oz)    Exam:   General:  NAD  Cardiovascular: RRR  Respiratory: CTAB  Abdomen: SOFT/NT/ND/+BS  Data Reviewed: Basic Metabolic Panel:  Lab 03/18/12 8119 03/17/12 0942  NA 141 142  K 3.9 3.4*  CL 107 104  CO2 25 28  GLUCOSE 113* 107*  BUN 9 11  CREATININE 0.68 0.65  CALCIUM 9.3 9.5  MG -- --  PHOS -- --   Liver Function Tests:  Lab 03/17/12 1478  AST 16  ALT 11  ALKPHOS 78  BILITOT 0.6  PROT 6.7  ALBUMIN 3.6   No results found for this basename: LIPASE:5,AMYLASE:5 in the last 168 hours No results found for this basename: AMMONIA:5 in the last 168 hours CBC:  Lab 03/18/12 0937 03/18/12 0811 03/17/12 0942  WBC 9.5 10.4 6.7  NEUTROABS -- -- 4.6  HGB 12.6 12.9 13.7  HCT 38.5 39.3 40.7  MCV 89.3 88.9 88.9  PLT 228 225 253   Cardiac Enzymes: No results found for  this basename: CKTOTAL:5,CKMB:5,CKMBINDEX:5,TROPONINI:5 in the last 168 hours BNP (last 3 results) No results found for this basename: PROBNP:3 in the last 8760 hours CBG:  Lab 03/17/12 2112  GLUCAP 197*    No results found for this or any previous visit (from the past 240 hour(s)).   Studies: Dg Chest 2 View  03/17/2012  *RADIOLOGY REPORT*  Clinical Data: Dizziness.  Near syncope.  CHEST - 2 VIEW  Comparison: None  Findings: Mild hyperinflation of the lungs.  Biapical densities, likely scarring.  Heart is upper limits normal in size.  No acute opacities or effusions.  No acute bony abnormality.  IMPRESSION: COPD/chronic changes.  No active disease.   Original Report Authenticated By: Cyndie Chime, M.D.    Ct Head Wo Contrast  03/17/2012  *RADIOLOGY REPORT*  Clinical Data: Near syncope and confusion  CT HEAD WITHOUT CONTRAST  Technique:  Contiguous axial images were obtained from the base of the skull through the vertex without contrast. Study was obtained within 24 hours of patient arrival at the emergency department.  Comparison: None.  Findings: There is age-related volume loss.  There is no mass, hemorrhage, extra-axial fluid collection, or midline shift.  There is patchy small vessel disease throughout the centra semiovale bilaterally. There is decreased attenuation medial to the sylvian fissure on the left which appears asymmetric compared the right.  This finding may represent a small recent infarct.  No other evidence suggesting recent infarct seen.  Bony calvarium appears intact.  Mastoid air cells are clear.  IMPRESSION: Moderate supratentorial small vessel disease. Question recent small infarct on the left slightly medial to the sylvian fissure. No other findings suspicious for recent infarct.  No hemorrhage or mass effect.   Original Report Authenticated By: Arvin Collard. WOODRUFF III, M.D.    Mr Maxine Glenn Head Wo Contrast  03/18/2012  *RADIOLOGY REPORT*  Clinical Data:  CVA  MRI HEAD  WITHOUT CONTRAST MRA HEAD WITHOUT CONTRAST  Technique:  Multiplanar, multiecho pulse sequences of the brain and surrounding structures were obtained without intravenous contrast. Angiographic images of the head were obtained using MRA technique without contrast.  Comparison:  CT head 03/17/2012  MRI HEAD  Findings:  The patient was very claustrophobic and was not able to complete the study.  An incomplete study was performed.  Diffusion weighted imaging is satisfactory for diagnosis.  Images obtained are degraded by patient motion.  Negative for acute infarct.  Moderate chronic ischemic changes throughout the cerebral white matter bilaterally.  Ventricle size is normal.  No mass or fluid collection is identified.  IMPRESSION: Incomplete study due to claustrophobia.  No acute infarct.  MRA HEAD  Findings: Image quality degraded by motion.  Two attempts were made.  The better attempt is marginal for diagnosis.  Left vertebral artery is patent to the basilar and is dominant. Hypoplastic distal right vertebral artery.  Basilar is patent with misregistration artifact noted in the basilar and carotid arteries. Posterior cerebral arteries are patent  bilaterally.  Internal carotid artery is patent bilaterally with misregistration artifact due to motion.  Anterior and middle cerebral arteries are patent bilaterally with irregularity which could be due to motion or atherosclerotic disease.  No large vessel occlusion.  No large aneurysm is identified.  IMPRESSION: Image quality degraded by motion.  No large vessel occlusion or critical stenosis.   Original Report Authenticated By: Camelia Phenes, M.D.    Mr Brain Wo Contrast  03/18/2012  *RADIOLOGY REPORT*  Clinical Data:  CVA  MRI HEAD WITHOUT CONTRAST MRA HEAD WITHOUT CONTRAST  Technique:  Multiplanar, multiecho pulse sequences of the brain and surrounding structures were obtained without intravenous contrast. Angiographic images of the head were obtained using MRA  technique without contrast.  Comparison:  CT head 03/17/2012  MRI HEAD  Findings:  The patient was very claustrophobic and was not able to complete the study.  An incomplete study was performed.  Diffusion weighted imaging is satisfactory for diagnosis.  Images obtained are degraded by patient motion.  Negative for acute infarct.  Moderate chronic ischemic changes throughout the cerebral white matter bilaterally.  Ventricle size is normal.  No mass or fluid collection is identified.  IMPRESSION: Incomplete study due to claustrophobia.  No acute infarct.  MRA HEAD  Findings: Image quality degraded by motion.  Two attempts were made.  The better attempt is marginal for diagnosis.  Left vertebral artery is patent to the basilar and is dominant. Hypoplastic distal right vertebral artery.  Basilar is patent with misregistration artifact noted in the basilar and carotid arteries. Posterior cerebral arteries are patent bilaterally.  Internal carotid artery is patent bilaterally with misregistration artifact due to motion.  Anterior and middle cerebral arteries are patent bilaterally with irregularity which could be due to motion or atherosclerotic disease.  No large vessel occlusion.  No large aneurysm is identified.  IMPRESSION: Image quality degraded by motion.  No large vessel occlusion or critical stenosis.   Original Report Authenticated By: Camelia Phenes, M.D.     Scheduled Meds:    . sodium chloride   Intravenous STAT  . aspirin EC  81 mg Oral Daily  . calcium-vitamin D  1 tablet Oral Daily  . enoxaparin (LOVENOX) injection  40 mg Subcutaneous Q24H  . LORazepam  1 mg Intravenous Once  . multivitamin with minerals  1 tablet Oral Daily  . polyethylene glycol  17 g Oral Daily  . potassium chloride  40 mEq Oral Once  . sodium chloride  1,000 mL Intravenous Once  . sodium chloride  3 mL Intravenous Q12H  . DISCONTD: Memantine HCl ER  28 mg Oral Daily   Continuous Infusions:    . sodium chloride 75  mL/hr at 03/17/12 2213    Principal Problem:  *Near syncope Active Problems:  Dementia  Hypotension  Hypokalemia  Weakness generalized  Breast mass, right    Time spent: > 35 mins    [facility]  [facility] Pager [phone]. If 8PM-8AM, please contact night-coverage at [email or web address], password [email or web address] 03/18/2012, 10:25 AM  LOS: 1 day

## 2012-03-18 NOTE — Progress Notes (Signed)
Echocardiogram 2D Echocardiogram has been performed.  Laura Colon 03/18/2012, 2:42 PM

## 2012-03-19 ENCOUNTER — Observation Stay (HOSPITAL_COMMUNITY): Payer: Medicare Other

## 2012-03-19 DIAGNOSIS — R55 Syncope and collapse: Secondary | ICD-10-CM | POA: Diagnosis not present

## 2012-03-19 DIAGNOSIS — R109 Unspecified abdominal pain: Secondary | ICD-10-CM | POA: Diagnosis not present

## 2012-03-19 DIAGNOSIS — K5900 Constipation, unspecified: Secondary | ICD-10-CM | POA: Diagnosis not present

## 2012-03-19 DIAGNOSIS — E876 Hypokalemia: Secondary | ICD-10-CM | POA: Diagnosis not present

## 2012-03-19 DIAGNOSIS — F0390 Unspecified dementia without behavioral disturbance: Secondary | ICD-10-CM | POA: Diagnosis not present

## 2012-03-19 LAB — BASIC METABOLIC PANEL
CO2: 24 mEq/L (ref 19–32)
Calcium: 9.2 mg/dL (ref 8.4–10.5)
Chloride: 108 mEq/L (ref 96–112)
Creatinine, Ser: 0.6 mg/dL (ref 0.50–1.10)
GFR calc Af Amer: >90 mL/min (ref >90)
Sodium: 144 mEq/L (ref 135–145)

## 2012-03-19 LAB — CBC
Platelets: 243 10*3/uL (ref 150–400)
RBC: 4.51 MIL/uL (ref 3.87–5.11)
RDW: 14.1 % (ref 11.5–15.5)
WBC: 7.8 10*3/uL (ref 4.0–10.5)

## 2012-03-19 MED ORDER — GI COCKTAIL ~~LOC~~
30.0000 mL | Freq: Three times a day (TID) | ORAL | Status: DC | PRN
  Filled 2012-03-19: qty 30

## 2012-03-19 MED ORDER — DOCUSATE SODIUM 100 MG PO CAPS
100.0000 mg | ORAL_CAPSULE | Freq: Two times a day (BID) | ORAL | Status: DC
  Administered 2012-03-20 – 2012-03-24 (×8): 100 mg via ORAL
  Filled 2012-03-19 (×11): qty 1

## 2012-03-19 MED ORDER — ACETAMINOPHEN 325 MG PO TABS
650.0000 mg | ORAL_TABLET | ORAL | Status: DC | PRN
  Administered 2012-03-19 – 2012-03-24 (×5): 650 mg via ORAL
  Filled 2012-03-19 (×7): qty 2

## 2012-03-19 MED ORDER — IBUPROFEN 400 MG PO TABS
400.0000 mg | ORAL_TABLET | Freq: Four times a day (QID) | ORAL | Status: DC | PRN
  Administered 2012-03-20: 400 mg via ORAL
  Filled 2012-03-19: qty 1

## 2012-03-19 MED ORDER — POLYETHYLENE GLYCOL 3350 17 G PO PACK
17.0000 g | PACK | Freq: Two times a day (BID) | ORAL | Status: DC
  Administered 2012-03-19: 17 g via ORAL
  Filled 2012-03-19 (×3): qty 1

## 2012-03-19 MED ORDER — FLEET ENEMA 7-19 GM/118ML RE ENEM
1.0000 | ENEMA | Freq: Once | RECTAL | Status: AC
  Administered 2012-03-19: 1 via RECTAL
  Filled 2012-03-19: qty 1

## 2012-03-19 MED ORDER — SIMETHICONE 80 MG PO CHEW
80.0000 mg | CHEWABLE_TABLET | Freq: Four times a day (QID) | ORAL | Status: DC | PRN
  Administered 2012-03-19: 80 mg via ORAL
  Filled 2012-03-19: qty 1

## 2012-03-19 NOTE — Progress Notes (Signed)
Pt and husband refused to have blood drawn.

## 2012-03-19 NOTE — Evaluation (Signed)
Occupational Therapy Evaluation Patient Details Name: Laura Colon MRN: [id number] DOB: [birth year] Today's Date: 03/19/2012 Time: 1530-1550 OT Time Calculation (min): 20 min  OT Assessment / Plan / Recommendation Clinical Impression  Pt was admitted with weakness, near syncope.  Was more alert earlier per family, but currently restless and c/o pain in her back.  Minimally able to participate in evaluation. Pt has baseline dementia, but was independent in ADL and mobility.    OT Assessment  Patient needs continued OT Services    Follow Up Recommendations  Home health OT    Barriers to Discharge      Equipment Recommendations  Rolling walker with 5" wheels    Recommendations for Other Services    Frequency  Min 2X/week    Precautions / Restrictions Precautions Precautions: Fall Restrictions Weight Bearing Restrictions: No   Pertinent Vitals/Pain Back, unable to rate, warm packs and repositioned, RN aware    ADL  Eating/Feeding: Performed;+1 Total assistance Where Assessed - Eating/Feeding: Bed level Transfers/Ambulation Related to ADLs: Pt declined attempts to ambulate or transfer to chair. ADL Comments: Pt unable to participate, restless and c/o pain in her back.  Attempted to offer pt relief with sitting EOB and various positions in bed.  Also provided warm packs.    OT Diagnosis: Generalized weakness;Cognitive deficits;Acute pain  OT Problem List: Decreased activity tolerance;Impaired balance (sitting and/or standing);Decreased cognition;Decreased knowledge of use of DME or AE;Pain OT Treatment Interventions: Self-care/ADL training;DME and/or AE instruction;Balance training;Patient/family education   OT Goals Acute Rehab OT Goals OT Goal Formulation: Patient unable to participate in goal setting Time For Goal Achievement: 04/02/12 Potential to Achieve Goals: Good ADL Goals Pt Will Perform Eating: Independently;Sitting, chair ADL Goal: Eating - Progress: Goal  set today Pt Will Perform Grooming: with supervision;Standing at sink ADL Goal: Grooming - Progress: Goal set today Pt Will Perform Upper Body Bathing: with supervision;Sitting, edge of bed ADL Goal: Upper Body Bathing - Progress: Goal set today Pt Will Perform Lower Body Bathing: with min assist;Sit to stand from bed ADL Goal: Lower Body Bathing - Progress: Goal set today Pt Will Perform Upper Body Dressing: with supervision;Sitting, bed ADL Goal: Upper Body Dressing - Progress: Goal set today Pt Will Perform Lower Body Dressing: with min assist;Sit to stand from bed ADL Goal: Lower Body Dressing - Progress: Goal set today Pt Will Transfer to Toilet: with min assist;Ambulation;Regular height toilet ADL Goal: Toilet Transfer - Progress: Goal set today Pt Will Perform Toileting - Clothing Manipulation: with supervision;Standing ADL Goal: Toileting - Clothing Manipulation - Progress: Goal set today Pt Will Perform Toileting - Hygiene: with supervision;Sit to stand from 3-in-1/toilet ADL Goal: Toileting - Hygiene - Progress: Goal set today Miscellaneous OT Goals Miscellaneous OT Goal #1: Pt will perform bed mobility with supervision in prep for ADL at EOB. OT Goal: Miscellaneous Goal #1 - Progress: Goal set today  Visit Information  Last OT Received On: 03/19/12 Assistance Needed: +2    Subjective Data  Subjective: "My back." Patient Stated Goal: Pt unable to state.   Prior Functioning     Home Living Lives With: Spouse Available Help at Discharge: Family;Available 24 hours/day Type of Home: House Home Access: Stairs to enter [employer] of Steps: 1 Entrance Stairs-Rails: None Home Layout: One level Bathroom Shower/Tub: Forensic scientist: Standard Bathroom Accessibility: Yes How Accessible: Accessible via walker Home Adaptive Equipment: Straight cane;Grab bars in shower;Quad cane Prior Function Level of Independence: Independent Able to  Take Stairs?: Yes Driving: Yes  Vocation: Retired Musician: No difficulties (pt is very sensitive to sound, appears to startle) Dominant Hand: Right         Vision/Perception     Cognition  Overall Cognitive Status: Difficult to assess Difficult to assess due to: Level of arousal Arousal/Alertness: Lethargic Behavior During Session: Restless    Extremity/Trunk Assessment Right Upper Extremity Assessment RUE ROM/Strength/Tone: WFL for tasks assessed Left Upper Extremity Assessment LUE ROM/Strength/Tone: WFL for tasks assessed     Mobility Bed Mobility Bed Mobility: Rolling Right;Rolling Left;Supine to Sit;Sit to Supine;Sitting - Scoot to [place] of Bed Rolling Right: 4: Min assist;With rail Rolling Left: 4: Min assist;With rail Supine to Sit: 3: Mod assist;With rails Sitting - Scoot to [place] of Bed: 3: Mod assist Sit to Supine: 3: Mod assist Transfers Transfers: Not assessed     Shoulder Instructions     Exercise     Balance Balance Balance Assessed: Yes Static Sitting Balance Static Sitting - Balance Support: Feet supported;Right upper extremity supported;Left upper extremity supported Static Sitting - Level of Assistance: 5: Stand by assistance   End of Session OT - End of Session Activity Tolerance: Patient limited by pain Patient left: in bed;with call bell/phone within reach;with family/visitor present Nurse Communication: Other (comment) (pain)  GO Functional Assessment Tool Used: clinical judgement Functional Limitation: Self care Self Care Current Status (Z6109): At least 80 percent but less than 100 percent impaired, limited or restricted Self Care Goal Status (U0454): At least 1 percent but less than 20 percent impaired, limited or restricted   Evern Bio 03/19/2012, 4:03 PM [phone]

## 2012-03-19 NOTE — Plan of Care (Signed)
Problem: Phase II Progression Outcomes Goal: Vital signs remain stable Outcome: Completed/Met Date Met:  03/19/12 Pt vss, no s/s of distress, goal met

## 2012-03-19 NOTE — Progress Notes (Signed)
Pt was c/o abd pain, pt given simethicone per MD, pt has abd CT, pt given fleets enema per Dr Janee Morn, pt had small BM, will continue to monitor Worthy Flank, RN

## 2012-03-19 NOTE — Progress Notes (Signed)
[facility] PROGRESS NOTE  Laura Colon:[id number] DOB: Nov 29, 1923 DOA: 03/17/2012 PCP: Astrid Divine, MD  Assessment/Plan:  #1 near-syncope Questionable etiology likely secondary to hypotension secondary to volume depletion. Patient with no signs or symptoms of infection. MRI of the head is negative for acute stroke. Patient with hypotension overnight which responded to IV fluids. Cardiac enzymes negative x3. 2-D echo with normal EF of 55-60%. No wall motion abnormalities. Aortic valve with no stenosis. TSH = 1.376. Follow.  #2 hypotension Likely secondary to volume depletion ; patient with no signs or symptoms of infection. MRI of the head was negative. Cardiac enzymes negative x3. 2-D echo with normal EF of 55-60%. No wall motion abnormalities. Aortic valve with no stenosis. Blood pressure has responded to IV fluids. Will saline lock IV fluids.  #3 hypokalemia Repeated.  #4 generalized weakness Family feels patient's generalized weakness likely secondary to patient recently been started on Namenda. Family request and Namenda be discontinued. May be secondary to problem #2 versus debility versus worsening dementia. Cardiac enzymes negative x3. 2-D echo with normal EF of 55-60%. No wall motion abnormalities. Patient with no signs or symptoms of infection.  PT/ OT.  #5 abdominal pain Questionable etiology. Likely gaseous in nature. Simethicone has been ordered however patient refused at per nursing. Will check a KUB of the abdomen. Supportive care.  #6 dementia Family feels patient's condition may have worsened after being started on Namenda. Family request and Namenda be discontinued and it was discontinued yesterday. RPR is nonreactive. B12 levels are within normal limits. RBC folate is pending. Will have patient followup with PCP as outpatient of her Namenda for further evaluation and management.  #7 right breast mass Patient will likely benefit from a biopsy  to determine what her right breast masses. Family initially refused biopsy as outpatient. Further followup with PCP as outpatient.  #8 prophylaxis Lovenox for DVT prophylaxis.  Code Status: Full Family Communication: Updated husband, daughter, son at bedside. Disposition Plan: Home with home health  when medically stable   Consultants:  None  Procedures:  Chest x-ray 03/17/2012  2-D echo 03/18/2012  Antibiotics:  None  HPI/Subjective: Patient with complaints of abdominal pain and some neck pain. Per daughter she feels patient's abdominal pain may be gaseous in nature. Feels that patient did not have good rest last night. No further  near syncopal episodes.  Objective: Filed Vitals:   03/18/12 1007 03/18/12 1330 03/18/12 2209 03/19/12 0520  BP: 127/64 116/50 138/71 136/61  Pulse: 74 85 82 82  Temp: 99 F (37.2 C) 98.4 F (36.9 C) 98.2 F (36.8 C) 98.2 F (36.8 C)  TempSrc: Oral Oral Oral Oral  Resp: 18 20 20 20   Height:      Weight:    53 kg (116 lb 13.5 oz)  SpO2: 100% 98% 98% 95%    Intake/Output Summary (Last 24 hours) at 03/19/12 1038 Last data filed at 03/19/12 0700  Gross per 24 hour  Intake   2325 ml  Output   1850 ml  Net    475 ml   Filed Weights   03/17/12 1429 03/18/12 0700 03/19/12 0520  Weight: 53.6 kg (118 lb 2.7 oz) 53.2 kg (117 lb 4.6 oz) 53 kg (116 lb 13.5 oz)    Exam:   General:  NAD  Cardiovascular: RRR  Respiratory: CTAB  Abdomen: SOFT//ND/+BS/ TTP in LLQ  Data Reviewed: Basic Metabolic Panel:  Lab 03/19/12 8119 03/18/12 1478 03/18/12 0811 03/17/12 0942  NA 144 140 141  142  K 3.7 4.0 3.9 3.4*  CL 108 106 107 104  CO2 24 26 25 28   GLUCOSE 98 94 113* 107*  BUN 10 10 9 11   CREATININE 0.60 0.64 0.68 0.65  CALCIUM 9.2 9.1 9.3 9.5  MG -- -- -- --  PHOS -- -- -- --   Liver Function Tests:  Lab 03/17/12 0942  AST 16  ALT 11  ALKPHOS 78  BILITOT 0.6  PROT 6.7  ALBUMIN 3.6   No results found for this basename:  LIPASE:5,AMYLASE:5 in the last 168 hours No results found for this basename: AMMONIA:5 in the last 168 hours CBC:  Lab 03/19/12 0825 03/18/12 0937 03/18/12 0811 03/17/12 0942  WBC 7.8 9.5 10.4 6.7  NEUTROABS -- -- -- 4.6  HGB 13.1 12.6 12.9 13.7  HCT 39.8 38.5 39.3 40.7  MCV 88.2 89.3 88.9 88.9  PLT 243 228 225 253   Cardiac Enzymes:  Lab 03/18/12 2107 03/18/12 1550 03/18/12 0936  CKTOTAL -- -- --  CKMB -- -- --  CKMBINDEX -- -- --  TROPONINI <0.30 <0.30 <0.30   BNP (last 3 results) No results found for this basename: PROBNP:3 in the last 8760 hours CBG:  Lab 03/17/12 2112  GLUCAP 197*    Recent Results (from the past 240 hour(s))  URINE CULTURE     Status: Normal   Collection Time   03/17/12 10:57 AM      Component Value Range Status Comment   Specimen Description URINE, CATHETERIZED   Final    Special Requests NONE   Final    Culture  Setup Time 03/17/2012 11:56   Final    Colony Count NO GROWTH   Final    Culture NO GROWTH   Final    Report Status 03/18/2012 FINAL   Final      Studies: Mr Maxine Glenn Head Wo Contrast  03/18/2012  *RADIOLOGY REPORT*  Clinical Data:  CVA  MRI HEAD WITHOUT CONTRAST MRA HEAD WITHOUT CONTRAST  Technique:  Multiplanar, multiecho pulse sequences of the brain and surrounding structures were obtained without intravenous contrast. Angiographic images of the head were obtained using MRA technique without contrast.  Comparison:  CT head 03/17/2012  MRI HEAD  Findings:  The patient was very claustrophobic and was not able to complete the study.  An incomplete study was performed.  Diffusion weighted imaging is satisfactory for diagnosis.  Images obtained are degraded by patient motion.  Negative for acute infarct.  Moderate chronic ischemic changes throughout the cerebral white matter bilaterally.  Ventricle size is normal.  No mass or fluid collection is identified.  IMPRESSION: Incomplete study due to claustrophobia.  No acute infarct.  MRA HEAD   Findings: Image quality degraded by motion.  Two attempts were made.  The better attempt is marginal for diagnosis.  Left vertebral artery is patent to the basilar and is dominant. Hypoplastic distal right vertebral artery.  Basilar is patent with misregistration artifact noted in the basilar and carotid arteries. Posterior cerebral arteries are patent bilaterally.  Internal carotid artery is patent bilaterally with misregistration artifact due to motion.  Anterior and middle cerebral arteries are patent bilaterally with irregularity which could be due to motion or atherosclerotic disease.  No large vessel occlusion.  No large aneurysm is identified.  IMPRESSION: Image quality degraded by motion.  No large vessel occlusion or critical stenosis.   Original Report Authenticated By: Camelia Phenes, M.D.    Mr Brain Wo Contrast  03/18/2012  *RADIOLOGY REPORT*  Clinical Data:  CVA  MRI HEAD WITHOUT CONTRAST MRA HEAD WITHOUT CONTRAST  Technique:  Multiplanar, multiecho pulse sequences of the brain and surrounding structures were obtained without intravenous contrast. Angiographic images of the head were obtained using MRA technique without contrast.  Comparison:  CT head 03/17/2012  MRI HEAD  Findings:  The patient was very claustrophobic and was not able to complete the study.  An incomplete study was performed.  Diffusion weighted imaging is satisfactory for diagnosis.  Images obtained are degraded by patient motion.  Negative for acute infarct.  Moderate chronic ischemic changes throughout the cerebral white matter bilaterally.  Ventricle size is normal.  No mass or fluid collection is identified.  IMPRESSION: Incomplete study due to claustrophobia.  No acute infarct.  MRA HEAD  Findings: Image quality degraded by motion.  Two attempts were made.  The better attempt is marginal for diagnosis.  Left vertebral artery is patent to the basilar and is dominant. Hypoplastic distal right vertebral artery.  Basilar is  patent with misregistration artifact noted in the basilar and carotid arteries. Posterior cerebral arteries are patent bilaterally.  Internal carotid artery is patent bilaterally with misregistration artifact due to motion.  Anterior and middle cerebral arteries are patent bilaterally with irregularity which could be due to motion or atherosclerotic disease.  No large vessel occlusion.  No large aneurysm is identified.  IMPRESSION: Image quality degraded by motion.  No large vessel occlusion or critical stenosis.   Original Report Authenticated By: Camelia Phenes, M.D.     Scheduled Meds:    . aspirin EC  81 mg Oral Daily  . calcium-vitamin D  1 tablet Oral Daily  . enoxaparin (LOVENOX) injection  40 mg Subcutaneous Q24H  . multivitamin with minerals  1 tablet Oral Daily  . polyethylene glycol  17 g Oral Daily  . sodium chloride  3 mL Intravenous Q12H   Continuous Infusions:    . DISCONTD: sodium chloride Stopped (03/19/12 0929)    Principal Problem:  *Near syncope Active Problems:  Dementia  Hypotension  Hypokalemia  Weakness generalized  Breast mass, right  Abdominal pain    Time spent: > 35 mins    [facility]  [facility] Pager [phone]. If 8PM-8AM, please contact night-coverage at [email or web address], password [facility] 03/19/2012, 10:38 AM  LOS: 2 days

## 2012-03-20 DIAGNOSIS — E876 Hypokalemia: Secondary | ICD-10-CM | POA: Diagnosis not present

## 2012-03-20 DIAGNOSIS — R55 Syncope and collapse: Secondary | ICD-10-CM | POA: Diagnosis not present

## 2012-03-20 DIAGNOSIS — K5900 Constipation, unspecified: Secondary | ICD-10-CM | POA: Diagnosis present

## 2012-03-20 DIAGNOSIS — F0390 Unspecified dementia without behavioral disturbance: Secondary | ICD-10-CM | POA: Diagnosis not present

## 2012-03-20 LAB — BASIC METABOLIC PANEL
BUN: 9 mg/dL (ref 6–23)
Calcium: 9.2 mg/dL (ref 8.4–10.5)
GFR calc Af Amer: 89 mL/min — ABNORMAL LOW (ref >90)
GFR calc non Af Amer: 77 mL/min — ABNORMAL LOW (ref >90)
Glucose, Bld: 95 mg/dL (ref 70–99)
Potassium: 3.5 mEq/L (ref 3.5–5.1)
Sodium: 144 mEq/L (ref 135–145)

## 2012-03-20 MED ORDER — SORBITOL 70 % SOLN
30.0000 mL | Freq: Every day | Status: DC | PRN
  Administered 2012-03-20: 30 mL via ORAL
  Filled 2012-03-20: qty 30

## 2012-03-20 MED ORDER — SODIUM CHLORIDE 0.9 % IV SOLN
INTRAVENOUS | Status: DC
  Administered 2012-03-20 – 2012-03-22 (×4): via INTRAVENOUS

## 2012-03-20 NOTE — Progress Notes (Signed)
Pt given sorbatol to relieve constipation, pt had large BM, pt has poor po intake, ensures encouraged, Dr. Janee Morn ordered IVF NS @ 75/hr, pt is weak and assist x 2 oob to commode, pts lets buckle when standing, will encourage po intake and continue to monitor, pt given PRN pain meds for chronic back pain and K pack ordered as well for back pain, will continue to monitor Worthy Flank, RN

## 2012-03-20 NOTE — Progress Notes (Signed)
CSW contacted daughter, Clydie Braun [phone]) to provide support on d/c planning and patient's medical condition. CSW provided support to the daughter and answered questions on the patient's ALF options. The patient's daughter informed CSW that the desired plan would be to bring patient home, if medical condition improves. CSW provided the patient's daughter with her contact information for the weekend, and encouraged the family to request for weekday CSW if additional needs arise. At this time, family is looking into ALF options as a back up to d/c home with [facility].  Lia Foyer, LCSWA [facility] Clinical Social Worker Contact #: [phone] (weekend)

## 2012-03-20 NOTE — Plan of Care (Signed)
Problem: Phase II Progression Outcomes Goal: Obtain order to discontinue catheter if appropriate Outcome: Not Applicable Date Met:  03/20/12 Pt does not have foley, no indications for foley, goal NA

## 2012-03-20 NOTE — Progress Notes (Signed)
HOME HEALTH AGENCIES SERVING [place]   Agencies that are Medicare-Certified and are affiliated with [facility]  Telephone Number Address  [facility]   The [facility] has ownership interest in this company; however, you are under no obligation to use this agency. [phone] or  [phone] [address] [place], [place] [address]   Agencies that are Medicare-Certified and are not affiliated with [facility] Telephone Number Address  [facility] [phone] Fax [phone] [address] [place], [place]  [address]  [facility] [phone] or [phone] Fax [phone] [address] [place], [place] [address]  [facility] [phone] Fax [phone] [address] [place], [place] [address]  [facility] [phone] Fax [phone] [address] [place], [place]  [address]  [facility] [phone] Fax [phone] [address] [place], [place] [address]  [facility] [phone] [address] [place], [place] [address]  [facility] [phone]  [address] [place], [place] [address]  [facility] [phone] or [phone] Fax [phone] [address] [place], [place]  [address]  [facility] [phone] Fax [phone] [address] [place], [place]  [address]  [facility]  [phone] Fax [phone] [address] [place], [place] [address]               Agencies that are not Medicare-Certified and are not affiliated with [facility] Agency Telephone Number Address  [facility] [place] [phone] or [phone] Fax [phone] [address], [place]  [address]  [facility] [phone] Fax [phone] [address] [place], [place]  [address]  [facility]  [phone] Fax [phone] [address] [place], [place] [address]  [facility] [phone] Fax [phone] [address] [place], [place] [address]  [facility] [phone] or [phone] Fax [phone] [address] [place], [place]  [address]  [facility] [phone] or [phone] Fax [phone] [address], [address] [place], [place]  [address]  [facility] [phone] Fax [phone] [address] [place], [place]  [address]  [facility] [phone] [address] [place], [place]  [address]  [facility] [phone] Fax [phone] [address] [place], [place]  [address]  [facility] [phone] Fax [phone] [address] [place], [place]  [address]  [facility] [phone] Fax [phone] [address] [place], [place] [address]  [facility] [phone] Fax [phone] [address] [place], [place]  [address]   Choice of agencies offered to patient. [facility]. Appropriate referral will be made.

## 2012-03-20 NOTE — Progress Notes (Signed)
[facility] PROGRESS NOTE  Laura Colon RUE:[id number] DOB: 06/02/1924 DOA: 03/17/2012 PCP: Astrid Divine, MD  Assessment/Plan:  #1 near-syncope Questionable etiology likely secondary to hypotension secondary to volume depletion. Patient with no signs or symptoms of infection. MRI of the head is negative for acute stroke. Patient with hypotension overnight which responded to IV fluids. Cardiac enzymes negative x3. 2-D echo with normal EF of 55-60%. No wall motion abnormalities. Aortic valve with no stenosis. TSH = 1.376. Follow.  #2 hypotension Likely secondary to volume depletion ; patient with no signs or symptoms of infection. MRI of the head was negative. Cardiac enzymes negative x3. 2-D echo with normal EF of 55-60%. No wall motion abnormalities. Aortic valve with no stenosis. Blood pressure has responded to IV fluids. Will saline lock IV fluids.  #3 hypokalemia Repeated.  #4 generalized weakness Family feels patient's generalized weakness likely secondary to patient recently been started on Namenda. Family request and Namenda be discontinued. May be secondary to problem #2 versus debility versus worsening dementia. Cardiac enzymes negative x3. 2-D echo with normal EF of 55-60%. No wall motion abnormalities. Patient with no signs or symptoms of infection.  PT/ OT.  #5 abdominal pain/constipation Secondary to constipation. Abdominal x-rays with fecal retention. Patient received an enema yesterday however very small bowel movement. Patient not tolerating MiraLAX and cannot drink it fully per nursing. We'll give a dose of sorbitol. If no improvement will try a soapsuds enema. Follow.  #6 dementia Family feels patient's condition may have worsened after being started on Namenda. Family requested that Namenda be discontinued and it was discontinued. RPR is nonreactive. B12 levels are within normal limits. RBC folate is pending. Will have patient followup with PCP as  outpatient for  further evaluation and management.   #7 right breast mass Patient will likely benefit from a biopsy to determine what her right breast masses. Family initially refused biopsy as outpatient. Further followup with PCP as outpatient.  #8 prophylaxis Lovenox for DVT prophylaxis.  Code Status: Full Family Communication: Updated husband, daughter, son at bedside. Disposition Plan: Home with home health  when medically stable   Consultants:  None  Procedures:  Chest x-ray 03/17/2012  2-D echo 03/18/2012  Abdominal x-ray 03/19/2012  Antibiotics:  None  HPI/Subjective: Patient with complaints of abdominal pain and some neck pain. Patient given enema yesterday however very small bowel movement. Patient unable to tolerate or drink of MiraLAX. Family states patient had restful sleep last night.  Objective: Filed Vitals:   03/19/12 0520 03/19/12 1530 03/19/12 2229 03/20/12 0516  BP: 136/61 137/57 134/68 125/86  Pulse: 82 79 78 73  Temp: 98.2 F (36.8 C)  98.1 F (36.7 C) 98.3 F (36.8 C)  TempSrc: Oral  Oral Oral  Resp: 20 20 20 20   Height:      Weight: 53 kg (116 lb 13.5 oz)   52.6 kg (115 lb 15.4 oz)  SpO2: 95% 94% 95% 94%    Intake/Output Summary (Last 24 hours) at 03/20/12 1033 Last data filed at 03/20/12 0710  Gross per 24 hour  Intake      0 ml  Output    775 ml  Net   -775 ml   Filed Weights   03/18/12 0700 03/19/12 0520 03/20/12 0516  Weight: 53.2 kg (117 lb 4.6 oz) 53 kg (116 lb 13.5 oz) 52.6 kg (115 lb 15.4 oz)    Exam:   General:  NAD. Laying in bed feeling uncomfortable.  Cardiovascular: RRR  Respiratory:  CTAB  Abdomen: SOFT//ND/+BS/ TTP in RLQ  Data Reviewed: Basic Metabolic Panel:  Lab 03/20/12 2952 03/19/12 0825 03/18/12 0937 03/18/12 0811 03/17/12 0942  NA 144 144 140 141 142  K 3.5 3.7 4.0 3.9 3.4*  CL 107 108 106 107 104  CO2 27 24 26 25 28   GLUCOSE 95 98 94 113* 107*  BUN 9 10 10 9 11   CREATININE 0.69 0.60 0.64  0.68 0.65  CALCIUM 9.2 9.2 9.1 9.3 9.5  MG -- -- -- -- --  PHOS -- -- -- -- --   Liver Function Tests:  Lab 03/17/12 0942  AST 16  ALT 11  ALKPHOS 78  BILITOT 0.6  PROT 6.7  ALBUMIN 3.6   No results found for this basename: LIPASE:5,AMYLASE:5 in the last 168 hours No results found for this basename: AMMONIA:5 in the last 168 hours CBC:  Lab 03/19/12 0825 03/18/12 0937 03/18/12 0811 03/17/12 0942  WBC 7.8 9.5 10.4 6.7  NEUTROABS -- -- -- 4.6  HGB 13.1 12.6 12.9 13.7  HCT 39.8 38.5 39.3 40.7  MCV 88.2 89.3 88.9 88.9  PLT 243 228 225 253   Cardiac Enzymes:  Lab 03/18/12 2107 03/18/12 1550 03/18/12 0936  CKTOTAL -- -- --  CKMB -- -- --  CKMBINDEX -- -- --  TROPONINI <0.30 <0.30 <0.30   BNP (last 3 results) No results found for this basename: PROBNP:3 in the last 8760 hours CBG:  Lab 03/17/12 2112  GLUCAP 197*    Recent Results (from the past 240 hour(s))  URINE CULTURE     Status: Normal   Collection Time   03/17/12 10:57 AM      Component Value Range Status Comment   Specimen Description URINE, CATHETERIZED   Final    Special Requests NONE   Final    Culture  Setup Time 03/17/2012 11:56   Final    Colony Count NO GROWTH   Final    Culture NO GROWTH   Final    Report Status 03/18/2012 FINAL   Final      Studies: Dg Abd 1 View  03/19/2012  *RADIOLOGY REPORT*  Clinical Data: Abdominal pain  ABDOMEN - 1 VIEW  Comparison: None.  Findings: There are no abnormally dilated loops of bowel.  There is stool throughout the entire colon, in the rectum is mildly distended with gas and stool.  IMPRESSION: Significant fecal retention.   Original Report Authenticated By: Otilio Carpen, M.D.     Scheduled Meds:    . aspirin EC  81 mg Oral Daily  . calcium-vitamin D  1 tablet Oral Daily  . docusate sodium  100 mg Oral BID  . enoxaparin (LOVENOX) injection  40 mg Subcutaneous Q24H  . multivitamin with minerals  1 tablet Oral Daily  . sodium chloride  3 mL  Intravenous Q12H  . sodium phosphate  1 enema Rectal Once  . DISCONTD: polyethylene glycol  17 g Oral Daily  . DISCONTD: polyethylene glycol  17 g Oral BID   Continuous Infusions:    Principal Problem:  *Near syncope Active Problems:  Dementia  Hypotension  Hypokalemia  Weakness generalized  Breast mass, right  Abdominal pain  Constipation    Time spent: > 35 mins    [facility]  [facility] Pager [phone]. If 8PM-8AM, please contact night-coverage at [email or web address], password [email or web address] 03/20/2012, 10:33 AM  LOS: 3 days

## 2012-03-21 ENCOUNTER — Other Ambulatory Visit: Payer: Medicare Other

## 2012-03-21 DIAGNOSIS — N63 Unspecified lump in unspecified breast: Secondary | ICD-10-CM | POA: Diagnosis not present

## 2012-03-21 DIAGNOSIS — Z853 Personal history of malignant neoplasm of breast: Secondary | ICD-10-CM | POA: Diagnosis not present

## 2012-03-21 DIAGNOSIS — R5383 Other fatigue: Secondary | ICD-10-CM

## 2012-03-21 DIAGNOSIS — E46 Unspecified protein-calorie malnutrition: Secondary | ICD-10-CM | POA: Clinically undetermined

## 2012-03-21 DIAGNOSIS — Z5189 Encounter for other specified aftercare: Secondary | ICD-10-CM | POA: Diagnosis not present

## 2012-03-21 DIAGNOSIS — M6281 Muscle weakness (generalized): Secondary | ICD-10-CM | POA: Diagnosis present

## 2012-03-21 DIAGNOSIS — I959 Hypotension, unspecified: Secondary | ICD-10-CM | POA: Diagnosis not present

## 2012-03-21 DIAGNOSIS — Z9010 Acquired absence of unspecified breast and nipple: Secondary | ICD-10-CM | POA: Diagnosis not present

## 2012-03-21 DIAGNOSIS — R109 Unspecified abdominal pain: Secondary | ICD-10-CM | POA: Diagnosis not present

## 2012-03-21 DIAGNOSIS — E876 Hypokalemia: Secondary | ICD-10-CM | POA: Diagnosis present

## 2012-03-21 DIAGNOSIS — R638 Other symptoms and signs concerning food and fluid intake: Secondary | ICD-10-CM | POA: Diagnosis not present

## 2012-03-21 DIAGNOSIS — Z79899 Other long term (current) drug therapy: Secondary | ICD-10-CM | POA: Diagnosis not present

## 2012-03-21 DIAGNOSIS — R55 Syncope and collapse: Secondary | ICD-10-CM | POA: Diagnosis not present

## 2012-03-21 DIAGNOSIS — Z7982 Long term (current) use of aspirin: Secondary | ICD-10-CM | POA: Diagnosis not present

## 2012-03-21 DIAGNOSIS — R5381 Other malaise: Secondary | ICD-10-CM | POA: Diagnosis not present

## 2012-03-21 DIAGNOSIS — K5900 Constipation, unspecified: Secondary | ICD-10-CM | POA: Diagnosis not present

## 2012-03-21 DIAGNOSIS — I9589 Other hypotension: Secondary | ICD-10-CM | POA: Diagnosis present

## 2012-03-21 DIAGNOSIS — F0390 Unspecified dementia without behavioral disturbance: Secondary | ICD-10-CM | POA: Diagnosis not present

## 2012-03-21 LAB — BASIC METABOLIC PANEL
BUN: 11 mg/dL (ref 6–23)
BUN: 10 mg/dL (ref 6–23)
Calcium: 8.9 mg/dL (ref 8.4–10.5)
Creatinine, Ser: 0.66 mg/dL (ref 0.50–1.10)
Creatinine, Ser: 0.64 mg/dL (ref 0.50–1.10)
GFR calc Af Amer: >90 mL/min (ref >90)
GFR calc Af Amer: >90 mL/min (ref >90)
GFR calc non Af Amer: 78 mL/min — ABNORMAL LOW (ref >90)
GFR calc non Af Amer: 79 mL/min — ABNORMAL LOW (ref >90)

## 2012-03-21 LAB — ALBUMIN: Albumin: 3.2 g/dL — ABNORMAL LOW (ref 3.5–5.2)

## 2012-03-21 LAB — MAGNESIUM: Magnesium: 2 mg/dL (ref 1.5–2.5)

## 2012-03-21 LAB — FOLATE RBC: RBC Folate: 1108 ng/mL — ABNORMAL HIGH (ref >=366)

## 2012-03-21 MED ORDER — ENSURE COMPLETE PO LIQD
237.0000 mL | Freq: Two times a day (BID) | ORAL | Status: DC
  Administered 2012-03-21 – 2012-03-24 (×6): 237 mL via ORAL

## 2012-03-21 NOTE — Progress Notes (Signed)
[facility] PROGRESS NOTE  Laura Colon WUX:[id number] DOB: Sep 19, 1923 DOA: 03/17/2012 PCP: Astrid Divine, MD  Assessment/Plan:  #1 near-syncope Questionable etiology likely secondary to hypotension secondary to volume depletion. Patient with no signs or symptoms of infection. MRI of the head is negative for acute stroke. Patient with hypotension overnight which responded to IV fluids. Cardiac enzymes negative x3. 2-D echo with normal EF of 55-60%. No wall motion abnormalities. Aortic valve with no stenosis. TSH = 1.376. Follow. IV fluids.  #2 hypotension Likely secondary to volume depletion ; patient with no signs or symptoms of infection. MRI of the head was negative. Cardiac enzymes negative x3. 2-D echo with normal EF of 55-60%. No wall motion abnormalities. Aortic valve with no stenosis. Blood pressure has responded to IV fluids. IV fluids restarted yesterday.   #3 hypokalemia Repeated.  #4 generalized weakness Family feels patient's generalized weakness likely secondary to patient recently been started on Namenda and also with poor oral intake. Patient is currently requiring two-person assist, and buckles when standing up per physical therapy. Family requested Namenda be discontinued. May be secondary to problem #2 versus debility versus worsening dementia. Cardiac enzymes negative x3. 2-D echo with normal EF of 55-60%. No wall motion abnormalities. Patient with no signs or symptoms of infection.  PT/ OT.  #5 abdominal pain/constipation Secondary to constipation. Abdominal x-rays with fecal retention. Patient received an enema 2 days ago, however very small bowel movement. Patient not tolerating MiraLAX and cannot drink it fully per nursing. Patient with large bowel movement after a dose of sorbitol yesterday. Abdominal pain improved.   #6 dementia Family feels patient's condition may have worsened after being started on Namenda. Family requested that Namenda be  discontinued and it was discontinued. RPR is nonreactive. B12 levels are within normal limits. RBC folate is pending. Will have patient followup with PCP as outpatient for  further evaluation and management.   #7 malnutrition/poor oral intake Per nursing and family patient with approximately less than 30-40% of oral intake. We'll place on Ensure. We'll consult with dietitian for further evaluation and management.  #8 right breast mass Patient will likely benefit from a biopsy to determine what her right breast masses. Family initially refused biopsy as outpatient. Further followup with PCP as outpatient.  #9 prophylaxis Lovenox for DVT prophylaxis.  Code Status: Full Family Communication: Updated husband, daughter, at bedside. Disposition Plan: Home with home health  when medically stable   Consultants:  None  Procedures:  Chest x-ray 03/17/2012  2-D echo 03/18/2012  Abdominal x-ray 03/19/2012  Antibiotics:  None  HPI/Subjective: Patient with improved abdominal pain after bowel movement yesterday after sorbitol. Per nursing and family patient with poor oral intake approximately less than 30-40% intake.  Objective: Filed Vitals:   03/20/12 1300 03/20/12 2038 03/21/12 0623 03/21/12 1002  BP: 108/66 115/65 137/74 131/91  Pulse: 63 72 72 67  Temp: 98.2 F (36.8 C) 98.2 F (36.8 C) 98.2 F (36.8 C)   TempSrc: Oral Oral Oral   Resp: 20 20 20 18   Height:      Weight:   54.432 kg (120 lb)   SpO2: 92% 95% 94% 93%    Intake/Output Summary (Last 24 hours) at 03/21/12 1057 Last data filed at 03/21/12 0512  Gross per 24 hour  Intake 981.25 ml  Output    675 ml  Net 306.25 ml   Filed Weights   03/19/12 0520 03/20/12 0516 03/21/12 0623  Weight: 53 kg (116 lb 13.5 oz) 52.6 kg (  115 lb 15.4 oz) 54.432 kg (120 lb)    Exam:   General:  NAD. Sitting in chair, more alert.  Cardiovascular: RRR  Respiratory: CTAB  Abdomen: SOFT//ND/+BS/ TTP in RLQ  Data  Reviewed: Basic Metabolic Panel:  Lab 03/21/12 1610 03/21/12 0520 03/20/12 0650 03/19/12 0825 03/18/12 0937  NA 141 143 144 144 140  K 3.7 3.6 3.5 3.7 4.0  CL 106 107 107 108 106  CO2 24 26 27 24 26   GLUCOSE 98 91 95 98 94  BUN 10 11 9 10 10   CREATININE 0.64 0.66 0.69 0.60 0.64  CALCIUM 8.9 8.9 9.2 9.2 9.1  MG -- 2.0 -- -- --  PHOS -- -- -- -- --   Liver Function Tests:  Lab 03/17/12 0942  AST 16  ALT 11  ALKPHOS 78  BILITOT 0.6  PROT 6.7  ALBUMIN 3.6   No results found for this basename: LIPASE:5,AMYLASE:5 in the last 168 hours No results found for this basename: AMMONIA:5 in the last 168 hours CBC:  Lab 03/19/12 0825 03/18/12 0937 03/18/12 0811 03/17/12 0942  WBC 7.8 9.5 10.4 6.7  NEUTROABS -- -- -- 4.6  HGB 13.1 12.6 12.9 13.7  HCT 39.8 38.5 39.3 40.7  MCV 88.2 89.3 88.9 88.9  PLT 243 228 225 253   Cardiac Enzymes:  Lab 03/18/12 2107 03/18/12 1550 03/18/12 0936  CKTOTAL -- -- --  CKMB -- -- --  CKMBINDEX -- -- --  TROPONINI <0.30 <0.30 <0.30   BNP (last 3 results) No results found for this basename: PROBNP:3 in the last 8760 hours CBG:  Lab 03/17/12 2112  GLUCAP 197*    Recent Results (from the past 240 hour(s))  URINE CULTURE     Status: Normal   Collection Time   03/17/12 10:57 AM      Component Value Range Status Comment   Specimen Description URINE, CATHETERIZED   Final    Special Requests NONE   Final    Culture  Setup Time 03/17/2012 11:56   Final    Colony Count NO GROWTH   Final    Culture NO GROWTH   Final    Report Status 03/18/2012 FINAL   Final      Studies: Dg Abd 1 View  03/19/2012  *RADIOLOGY REPORT*  Clinical Data: Abdominal pain  ABDOMEN - 1 VIEW  Comparison: None.  Findings: There are no abnormally dilated loops of bowel.  There is stool throughout the entire colon, in the rectum is mildly distended with gas and stool.  IMPRESSION: Significant fecal retention.   Original Report Authenticated By: Otilio Carpen, M.D.      Scheduled Meds:    . aspirin EC  81 mg Oral Daily  . calcium-vitamin D  1 tablet Oral Daily  . docusate sodium  100 mg Oral BID  . enoxaparin (LOVENOX) injection  40 mg Subcutaneous Q24H  . feeding supplement  237 mL Oral BID BM  . multivitamin with minerals  1 tablet Oral Daily  . sodium chloride  3 mL Intravenous Q12H   Continuous Infusions:    . sodium chloride 75 mL/hr at 03/21/12 0850    Principal Problem:  *Near syncope Active Problems:  Dementia  Hypotension  Hypokalemia  Weakness generalized  Breast mass, right  Abdominal pain  Constipation  Malnutrition  Inadequate oral intake    Time spent: > 35 mins    [facility]  [facility] Pager [phone]. If 8PM-8AM, please contact night-coverage at [email or web address], password [facility] 03/21/2012,  10:57 AM  LOS: 4 days

## 2012-03-21 NOTE — Progress Notes (Signed)
03/21/12, [name] RNC-MNN, BSN, [phone], CM received referral for additional HH orders.  Kristin at [facility] contacted with orders and confirmation received.  Justin at [facility] contacted with DME order(s) and confirmation received.  Pt's daughter given list of Private Duty sitters at MD's request.

## 2012-03-21 NOTE — Progress Notes (Signed)
CSW met with patient's son to discuss SNF placement. At this time, patient's son does not want SNF placement for his mother. Patient's son stated,  " this is a last resort". Patient's son does not want patient's information faxed out to SNF facilities. Clinical Social Worker will sign off for now as social work intervention is no longer needed. Please consult [place] again if new need arises.   Sabino Niemann, MSW, [employer] [phone]

## 2012-03-21 NOTE — Progress Notes (Signed)
Occupational Therapy Treatment Patient Details Name: Laura Colon MRN: [id number] DOB: 1924/06/04 Today's Date: 03/21/2012 Time: 1191-4782 OT Time Calculation (min): 24 min  OT Assessment / Plan / Recommendation Comments on Treatment Session Pt remains lethargic with minimally ability to participate in mobility requiring assist of 2 for OOB.  Pt is dependent in all aspects of ADL.  Will continue to follow.  May need to update d/c plan to SNF depending on progress.    Follow Up Recommendations  Home health OT;Other (comment);Supervision/Assistance - 24 hour (may need to update to SNF)    Barriers to Discharge       Equipment Recommendations  3 in 1 bedside comode;Rolling walker with 5" wheels    Recommendations for Other Services    Frequency Min 2X/week   Plan Discharge plan remains appropriate    Precautions / Restrictions Precautions Precautions: Fall   Pertinent Vitals/Pain Back pain, pt unable to rate, RN provided meds, repositioned    ADL  Eating/Feeding: Performed;+1 Total assistance Where Assessed - Eating/Feeding: Chair Equipment Used: Gait belt Transfers/Ambulation Related to ADLs: Transferred bed to chair only with +2 total assist, pt 70%. ADL Comments: Pt not able to participate, remains lethargic and restless, startles easily to noise.    OT Diagnosis:    OT Problem List:   OT Treatment Interventions:     OT Goals ADL Goals Pt Will Perform Eating: Independently;Sitting, chair ADL Goal: Eating - Progress: Not progressing Pt Will Perform Grooming: with supervision;Standing at sink ADL Goal: Grooming - Progress: Not progressing Pt Will Perform Upper Body Bathing: with supervision;Sitting, edge of bed Pt Will Perform Lower Body Bathing: with min assist;Sit to stand from bed Pt Will Perform Upper Body Dressing: with supervision;Sitting, bed Pt Will Perform Lower Body Dressing: with min assist;Sit to stand from bed Pt Will Transfer to Toilet: with min  assist;Ambulation;Regular height toilet ADL Goal: Toilet Transfer - Progress: Not progressing Pt Will Perform Toileting - Clothing Manipulation: with supervision;Standing Pt Will Perform Toileting - Hygiene: with supervision;Sit to stand from 3-in-1/toilet Miscellaneous OT Goals Miscellaneous OT Goal #1: Pt will perform bed mobility with supervision in prep for ADL at EOB. OT Goal: Miscellaneous Goal #1 - Progress: Not progressing  Visit Information  Last OT Received On: 03/21/12 Assistance Needed: +2 PT/OT Co-Evaluation/Treatment: Yes    Subjective Data      Prior Functioning       Cognition  Overall Cognitive Status: Impaired Area of Impairment: Attention;Following commands;Memory;Awareness of deficits Arousal/Alertness: Lethargic Orientation Level: Disoriented to;Time;Situation Behavior During Session: Restless Current Attention Level: Focused Memory Deficits: pt with baseline dementia which is progressing rapidly Following Commands: Follows one step commands inconsistently    Mobility  Shoulder Instructions Bed Mobility Bed Mobility: Supine to Sit;Sitting - Scoot to Edge of Bed Supine to Sit: 3: Mod assist Sitting - Scoot to Edge of Bed: 4: Min assist Details for Bed Mobility Assistance: needs maximum encouragement and physical assist to initiate. Transfers Transfers: Sit to Stand;Stand to Sit Sit to Stand: 1: +2 Total assist;From bed;Without upper extremity assist Sit to Stand: Patient Percentage: 60% Stand to Sit: 1: +2 Total assist;To chair/3-in-1 Stand to Sit: Patient Percentage: 60%       Exercises      Balance Balance Balance Assessed: Yes Static Sitting Balance Static Sitting - Balance Support: Feet supported;Right upper extremity supported;Left upper extremity supported Static Sitting - Level of Assistance: 5: Stand by assistance;Other (comment) (pt with LOB forward x 1) Static Sitting - Comment/# of Minutes: 5  Static Standing Balance Static Standing  - Balance Support: No upper extremity supported Static Standing - Level of Assistance: 1: +2 Total assist;Patient percentage (comment) (40, pt's knees noted to buckle repeatedly)   End of Session OT - End of Session Equipment Utilized During Treatment: Gait belt Activity Tolerance: Patient limited by pain;Other (comment) (lethargy) Patient left: in chair;with call bell/phone within reach;with family/visitor present;with nursing in room Nurse Communication: Mobility status;Other (comment);Patient requests pain meds (IV site painful)  GO Functional Assessment Tool Used: clinical judgement Functional Limitation: Self care Self Care Current Status (Z6109): 100 percent impaired, limited or restricted Self Care Goal Status (U0454): At least 80 percent but less than 100 percent impaired, limited or restricted   Evern Bio 03/21/2012, 10:20 AM [phone]

## 2012-03-21 NOTE — Progress Notes (Signed)
Nutrition Brief Note  RD consulted for assessment of nutrition needs in pt with poor PO and weakness. RD visited with pt and son who report pt's intake has improved.  PO now 50-85% of meals and pt has started an Ensure regimen today.  Son reports he was pleased to see his mother ate very well at dinner last night. 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  She is confident in managing her nutrition and wt.  She defers answering questions stating 'when I get home I know I'll eat better.  It's interesting to be on this side of things.  Used to I would be in your shoes.'  Pt is agreeable to Ensure BID as ordered by MD at least through d/c and possibly continuing at home.  She reports her husband is planning to hire someone to be with her during the day.  RD notes pt with dementia which has worsened recently, however son in room validates pt's statements and feels he has seen improvement. Pt reports she is hoping to d/c tomorrow.  Not pursuing SNF, planning on home with Home health services.  Pt's wt is up 2 lbs since admission.  Currently 120 lbs.  Body mass index is 23.44 kg/(m^2). Pt meets criteria for wnl based on current BMI.   Current diet order is Regular, patient is consuming >/=50% of meals at this time. Labs and medications reviewed. Note pt has stopped Namenda.  No nutrition interventions warranted at this time as current regimen and nutrition-related therapies are sufficient to meet needs.  RD to monitor for changes in status. If nutrition issues arise, please consult RD.   Loyce Dys, MS RD LDN Clinical Inpatient Dietitian Pager: [phone] Weekend/After hours pager: [phone]

## 2012-03-21 NOTE — Progress Notes (Signed)
Physical Therapy Treatment Patient Details Name: Laura Colon MRN: [id number] DOB: [birth year] Today's Date: 03/21/2012 Time: 7829-5621 PT Time Calculation (min): 26 min  PT Assessment / Plan / Recommendation Comments on Treatment Session  Pt cont's to have significant back pain at this date & requires +2 total assist for mobility.  Due to slow progress pt may benefit from ST-SNF but if she does go home she will need 24 hr (A), w/c, RW, 3-in-1, & HH aide.      Follow Up Recommendations  Home health PT;Supervision/Assistance - 24 hour     Does the patient have the potential to tolerate intense rehabilitation     Barriers to Discharge        Equipment Recommendations  3 in 1 bedside comode;Rolling walker with 5" wheels;Wheelchair (measurements)    Recommendations for Other Services    Frequency Min 3X/week   Plan Discharge plan remains appropriate    Precautions / Restrictions Precautions Precautions: Fall Restrictions Weight Bearing Restrictions: No   Pertinent Vitals/Pain C/o back pain but did not rate.  RN administered tylenol at beginning of session.     Mobility  Bed Mobility Bed Mobility: Supine to Sit;Sitting - Scoot to [place] of Bed Rolling Right: 4: Min assist;With rail Rolling Left: 4: Min assist;With rail Supine to Sit: 3: Mod assist Sitting - Scoot to Edge of Bed: 4: Min assist Sit to Supine: 3: Mod assist Details for Bed Mobility Assistance: needs maximum encouragement and physical assist to initiate. Transfers Transfers: Sit to Stand;Stand to Sit;Stand Pivot Transfers Sit to Stand: 1: +2 Total assist;From bed;Without upper extremity assist Sit to Stand: Patient Percentage: 60% Stand to Sit: 1: +2 Total assist;To chair/3-in-1 Stand to Sit: Patient Percentage: 60% Stand Pivot Transfers: 1: +2 Total assist Stand Pivot Transfers: Patient Percentage: 50% Details for Transfer Assistance: Cues for sequencing & technique.  Assist to achieve standing,  blocking of knees due to buckling, balance, rotation of hips from bed>recliner, & controlled descent.   Ambulation/Gait Ambulation/Gait Assistance: Not tested (comment)       PT Goals Acute Rehab PT Goals Time For Goal Achievement: 04/01/12 Potential to Achieve Goals: Fair Pt will go Sit to Stand: with modified independence PT Goal: Sit to Stand - Progress: Not progressing Pt will go Stand to Sit: with modified independence PT Goal: Stand to Sit - Progress: Not progressing Pt will Transfer Bed to Chair/Chair to Bed: with supervision PT Transfer Goal: Bed to Chair/Chair to Bed - Progress: Not progressing Pt will Ambulate: 51 - 150 feet;with supervision;with least restrictive assistive device  Visit Information  Last PT Received On: 03/21/12 Assistance Needed: +2    Subjective Data      Cognition  Overall Cognitive Status: History of cognitive impairments - at baseline Area of Impairment: Attention;Following commands;Memory;Awareness of deficits Difficult to assess due to: Level of arousal Arousal/Alertness: Lethargic Orientation Level: Disoriented to;Time;Situation Behavior During Session: Restless Current Attention Level: Focused Memory Deficits: pt with baseline dementia which is progressing rapidly Following Commands: Follows one step commands inconsistently    Balance  Balance Balance Assessed: Yes Static Sitting Balance Static Sitting - Balance Support: Feet supported;Right upper extremity supported;Left upper extremity supported Static Sitting - Level of Assistance: 5: Stand by assistance;1: +1 Total assist (Total (A) due to pt with LOB forwards while sitting EOB.  ) Static Sitting - Comment/# of Minutes: 5 minutes.  Pt required total assist at one point due to LOB forwards & no attempt to correct noted.  SBA due to  pt with random jerky/spasm like movements Static Standing Balance Static Standing - Balance Support: No upper extremity supported Static Standing - Level  of Assistance: 1: +2 Total assist;Patient percentage (comment) (40 %, pt's knees buckling repeatedly) Static Standing - Comment/# of Minutes: Assist for balance & blocking of knees due to knees buckling.   End of Session PT - End of Session Equipment Utilized During Treatment: Gait belt Activity Tolerance: Patient limited by fatigue;Patient limited by pain Patient left: in chair;with call bell/phone within reach;with family/visitor present Nurse Communication: Mobility status     Verdell Face, [place] [phone] 03/21/2012

## 2012-03-22 LAB — BASIC METABOLIC PANEL
BUN: 9 mg/dL (ref 6–23)
Calcium: 8.6 mg/dL (ref 8.4–10.5)
Creatinine, Ser: 0.67 mg/dL (ref 0.50–1.10)
GFR calc Af Amer: 90 mL/min — ABNORMAL LOW (ref >90)
GFR calc non Af Amer: 77 mL/min — ABNORMAL LOW (ref >90)

## 2012-03-22 NOTE — Progress Notes (Signed)
[facility] PROGRESS NOTE  JYSSICA RIEF ZOX:[id number] DOB: Apr 16, 1924 DOA: 03/17/2012 PCP: Astrid Divine, MD  Assessment/Plan:  #1 near-syncope Questionable etiology likely secondary to hypotension secondary to volume depletion. Patient with no signs or symptoms of infection. MRI of the head is negative for acute stroke. Patient with hypotension overnight which responded to IV fluids. Cardiac enzymes negative x3. 2-D echo with normal EF of 55-60%. No wall motion abnormalities. Aortic valve with no stenosis. TSH = 1.376. Follow. IV fluids.  #2 hypotension Likely secondary to volume depletion ; patient with no signs or symptoms of infection. MRI of the head was negative. Cardiac enzymes negative x3. 2-D echo with normal EF of 55-60%. No wall motion abnormalities. Aortic valve with no stenosis. Blood pressure has responded to IV fluids. IV fluids restarted.   #3 hypokalemia Repeated.  #4 generalized weakness Family feels patient's generalized weakness likely secondary to patient recently been started on Namenda and also with poor oral intake. Patient is currently requiring two-person assist, and buckles when standing up per physical therapy. Family requested Namenda be discontinued. May be secondary to problem #2 versus debility versus worsening dementia. Cardiac enzymes negative x3. 2-D echo with normal EF of 55-60%. No wall motion abnormalities. Patient with no signs or symptoms of infection.  PT/ OT. Needs SNF.  #5 abdominal pain/constipation Secondary to constipation. Abdominal x-rays with fecal retention. Patient received an enema 2 days ago, however very small bowel movement. Patient not tolerating MiraLAX and cannot drink it fully per nursing. Patient with large bowel movement after a dose of sorbitol yesterday. Abdominal pain improved. Colace.  #6 dementia Family feels patient's condition may have worsened after being started on Namenda. Family requested that  Namenda be discontinued and it was discontinued. RPR is nonreactive. B12 levels are within normal limits. Will have patient followup with PCP as outpatient for  further evaluation and management.   #7 malnutrition/poor oral intake Per nursing and family patient with approximately less than 30-40% of oral intake earlier on in hospitalization. Per family patient with improving oral intake. Continue Ensure. Patient has been seen by dietician. Follow.  #8 right breast mass Patient will likely benefit from a biopsy to determine what her right breast masses. Family initially refused biopsy as outpatient. Further followup with PCP as outpatient.  #9 prophylaxis Lovenox for DVT prophylaxis.  Code Status: Full Family Communication: Updated husband, daughter, son, at bedside. Disposition Plan: SNF vs Home with home health    Consultants:  None  Procedures:  Chest x-ray 03/17/2012  2-D echo 03/18/2012  Abdominal x-ray 03/19/2012  Antibiotics:  None  HPI/Subjective: Patient's  Family states patient's appetite improving. Patient denies abdominal pain. Patient c/o back pain.  Objective: Filed Vitals:   03/21/12 1341 03/21/12 2201 03/22/12 0600 03/22/12 1449  BP: 145/78 137/90 155/75 137/49  Pulse: 69 73 67 66  Temp: 98.2 F (36.8 C) 97.7 F (36.5 C) 97.4 F (36.3 C) 97.7 F (36.5 C)  TempSrc: Oral Oral Oral Oral  Resp: 18 18 20 19   Height:      Weight:   59.1 kg (130 lb 4.7 oz)   SpO2: 95% 93% 96% 96%    Intake/Output Summary (Last 24 hours) at 03/22/12 1529 Last data filed at 03/22/12 1519  Gross per 24 hour  Intake 2083.75 ml  Output   1475 ml  Net 608.75 ml   Filed Weights   03/20/12 0516 03/21/12 0623 03/22/12 0600  Weight: 52.6 kg (115 lb 15.4 oz) 54.432 kg (120 lb)  59.1 kg (130 lb 4.7 oz)    Exam:   General:  NAD. Sitting in chair, more alert.  Cardiovascular: RRR  Respiratory: CTAB  Abdomen: SOFT//ND/+BS/ TTP in RLQ  Data Reviewed: Basic Metabolic  Panel:  Lab 03/22/12 0557 03/21/12 0850 03/21/12 0520 03/20/12 0650 03/19/12 0825  NA 141 141 143 144 144  K 3.6 3.7 3.6 3.5 3.7  CL 106 106 107 107 108  CO2 28 24 26 27 24   GLUCOSE 103* 98 91 95 98  BUN 9 10 11 9 10   CREATININE 0.67 0.64 0.66 0.69 0.60  CALCIUM 8.6 8.9 8.9 9.2 9.2  MG -- -- 2.0 -- --  PHOS -- -- -- -- --   Liver Function Tests:  Lab 03/21/12 0850 03/17/12 0942  AST -- 16  ALT -- 11  ALKPHOS -- 78  BILITOT -- 0.6  PROT -- 6.7  ALBUMIN 3.2* 3.6   No results found for this basename: LIPASE:5,AMYLASE:5 in the last 168 hours No results found for this basename: AMMONIA:5 in the last 168 hours CBC:  Lab 03/19/12 0825 03/18/12 0937 03/18/12 0811 03/17/12 0942  WBC 7.8 9.5 10.4 6.7  NEUTROABS -- -- -- 4.6  HGB 13.1 12.6 12.9 13.7  HCT 39.8 38.5 39.3 40.7  MCV 88.2 89.3 88.9 88.9  PLT 243 228 225 253   Cardiac Enzymes:  Lab 03/18/12 2107 03/18/12 1550 03/18/12 0936  CKTOTAL -- -- --  CKMB -- -- --  CKMBINDEX -- -- --  TROPONINI <0.30 <0.30 <0.30   BNP (last 3 results) No results found for this basename: PROBNP:3 in the last 8760 hours CBG:  Lab 03/17/12 2112  GLUCAP 197*    Recent Results (from the past 240 hour(s))  URINE CULTURE     Status: Normal   Collection Time   03/17/12 10:57 AM      Component Value Range Status Comment   Specimen Description URINE, CATHETERIZED   Final    Special Requests NONE   Final    Culture  Setup Time 03/17/2012 11:56   Final    Colony Count NO GROWTH   Final    Culture NO GROWTH   Final    Report Status 03/18/2012 FINAL   Final      Studies: No results found.  Scheduled Meds:    . aspirin EC  81 mg Oral Daily  . calcium-vitamin D  1 tablet Oral Daily  . docusate sodium  100 mg Oral BID  . enoxaparin (LOVENOX) injection  40 mg Subcutaneous Q24H  . feeding supplement  237 mL Oral BID BM  . multivitamin with minerals  1 tablet Oral Daily  . sodium chloride  3 mL Intravenous Q12H   Continuous  Infusions:    . sodium chloride 50 mL/hr at 03/22/12 1257    Principal Problem:  *Near syncope Active Problems:  Dementia  Hypotension  Hypokalemia  Weakness generalized  Breast mass, right  Abdominal pain  Constipation  Malnutrition  Inadequate oral intake    Time spent: > 35 mins    [facility]  [facility] Pager [phone]. If 8PM-8AM, please contact night-coverage at [email or web address], password [facility] 03/22/2012, 3:29 PM  LOS: 5 days

## 2012-03-22 NOTE — Progress Notes (Signed)
Chaplain visited patient after receiving an order requisition. Chaplain found patient unresponsive with family members visiting in the patient's room. Chaplain shared words of hope, comfort and encouragement with the family. Chaplain prayed with family members and patient. Family members expressed their appreciation for Chaplain's visit and spiritual support.

## 2012-03-22 NOTE — Progress Notes (Signed)
PT Progress Note:     03/22/12 0800  PT Visit Information  Last PT Received On 03/22/12  Assistance Needed +2 (safety & ambulation)  PT Time Calculation  PT Start Time 0805  PT Stop Time 0836  PT Time Calculation (min) 31 min  Subjective Data  Subjective "this hurts"  Re: IV site in hand  Precautions  Precautions Fall  Restrictions  Weight Bearing Restrictions No  Cognition  Overall Cognitive Status History of cognitive impairments - at baseline  Area of Impairment Attention;Following commands;Safety/judgement;Awareness of errors;Awareness of deficits;Problem solving  Arousal/Alertness Lethargic  Orientation Level Disoriented to;Time;Situation  Behavior During Session Restless  Following Commands Follows one step commands with increased time;Follows multi-step commands inconsistently  Safety/Judgement Decreased awareness of safety precautions;Decreased safety judgement for tasks assessed;Decreased awareness of need for assistance;Impulsive  Safety/Judgement - Other Comments Requires max cues for increased safety with stand>sit, pt attempts to sit without warning & ensuring safety, also when ambulating pt with poor safety awareness & becomes "limp" & no postural control.    Awareness of Errors Assistance required to identify errors made;Assistance required to correct errors made  Cognition - Other Comments Pt with baseline dementia.  Pt's daughter feels decreased cognitive status at this time is due to new medication which has recently been stopped.   Bed Mobility  Bed Mobility Rolling Left;Left Sidelying to Sit;Sitting - Scoot to [place] of Bed  Rolling Left 4: Min assist;With rail  Left Sidelying to Sit 4: Min assist  Sitting - Scoot to Edge of Bed 4: Min guard  Details for Bed Mobility Assistance cues for sequencing & technique.  Close guarding due to eratic/uncontrollable movements & pt becoming "limp"-like while scooting to EOB.    Transfers  Transfers Sit to Stand;Stand to  [employer] Transfers  Sit to Stand 3: Mod assist;With upper extremity assist;With armrests;From bed;From chair/3-in-1  Stand to Sit 3: Mod assist  Stand Pivot Transfers 3: Mod assist  Details for Transfer Assistance Max cues for technique & increased safety.  Assist for balance & safety.  Pt becoming "limp" & seems to just give up, stating "I can't".    Ambulation/Gait  Ambulation/Gait Assistance 1: +2 Total assist  Ambulation/Gait: Patient Percentage 60%  Ambulation Distance (Feet) 5 Feet  Assistive device Rolling walker  Ambulation/Gait Assistance Details Max cues for safety.  Pt with poor stability & just becomes "limp" requiring increased assist.  Knees cont to buckle as well & pt just lets go of RW, & states "I can't"  Gait Pattern Shuffle  Balance  Balance Assessed Yes  Static Sitting Balance  Static Sitting - Balance Support Feet supported;Bilateral upper extremity supported  Static Sitting - Level of Assistance 5: Stand by assistance  PT - End of Session  Equipment Utilized During Treatment Gait belt  Activity Tolerance Patient limited by fatigue  Patient left in chair;with call bell/phone within reach;with family/visitor present  Nurse Communication Mobility status  PT - Assessment/Plan  Comments on Treatment Session Pt progressing slowly with mobility.  Pt's daughter feels cognitive deficits are progressed at this time due to new medication (has been stopped recently) & limited by fatigue.  Per progress notes, pt's son is adamant about pt returning home upon d/c but daughter & husband were present entire PT session today & report they are more open to discuss SNF options due to level of (A) needed for pt at this time.  If pt does go home, it will need to be seen that pt has adequate amount  of HH aide hrs to assist due to pt's husband unable to (A) with pt's mobility at this time.    PT Plan Discharge plan needs to be updated (due to slow progress)  PT Frequency Min 3X/week    Follow Up Recommendations Post acute inpatient;Supervision/Assistance - 24 hour;Home health PT  Does the patient have the potential to tolerate intense rehabilitation? No, Recommend SNF  Equipment Recommended 3 in 1 bedside comode;Rolling walker with 5" wheels;Wheelchair (measurements)  Acute Rehab PT Goals  Time For Goal Achievement 04/01/12  Potential to Achieve Goals Fair  Pt will go Sit to Stand with modified independence  PT Goal: Sit to Stand - Progress Progressing toward goal  Pt will go Stand to Sit with modified independence  PT Goal: Stand to Sit - Progress Progressing toward goal  Pt will Transfer Bed to Chair/Chair to Bed with supervision  PT Transfer Goal: Bed to Chair/Chair to Bed - Progress Progressing toward goal  Pt will Ambulate 51 - 150 feet;with supervision;with least restrictive assistive device  PT Goal: Ambulate - Progress Progressing toward goal  PT General Charges  $$ ACUTE PT VISIT 1 Procedure  PT Treatments  $Therapeutic Activity 23-37 mins     Verdell Face, [place] [phone] 03/22/2012

## 2012-03-22 NOTE — Progress Notes (Signed)
CSW was re-consulted for placement. Patient's family is now agreeable to SNF placement. CSW will begin all necessary paperwork for placement. CSW will return to offer choice to family.   Sabino Niemann, MSW, [employer] [phone]

## 2012-03-23 DIAGNOSIS — N63 Unspecified lump in unspecified breast: Secondary | ICD-10-CM

## 2012-03-23 NOTE — Progress Notes (Signed)
Occupational Therapy Treatment Patient Details Name: Laura Colon MRN: [id number] DOB: July 20, 1923 Today's Date: 03/23/2012 Time: 4098-1191 OT Time Calculation (min): 23 min  OT Assessment / Plan / Recommendation Comments on Treatment Session Making significant progress; however feel pt will benefit from rehab at SNF prior to return home with husband due to weakness and high risk of falls. Discussed with pt/family.    Follow Up Recommendations  Skilled nursing facility    Barriers to Discharge       Equipment Recommendations  3 in 1 bedside comode;Rolling walker with 5" wheels;Wheelchair (measurements)    Recommendations for Other Services    Frequency Min 2X/week   Plan Discharge plan needs to be updated    Precautions / Restrictions Precautions Precautions: Fall   Pertinent Vitals/Pain No c/o pain    ADL  Grooming: Performed;Wash/dry hands;Min guard Where Assessed - Grooming: Supported standing Lower Body Dressing: Performed;Supervision/safety;Set up Where Assessed - Lower Body Dressing: Supported sit to stand Toilet Transfer: Performed;Min Pension scheme manager Method: Sit to stand;Stand pivot Acupuncturist: Comfort height toilet Toileting - Architect and Hygiene: Simulated;Min guard Where Assessed - Engineer, mining and Hygiene: Standing Equipment Used: Gait belt;Rolling walker Transfers/Ambulation Related to ADLs: Min A ADL Comments: Improved participation    OT Diagnosis:    OT Problem List:   OT Treatment Interventions:     OT Goals Acute Rehab OT Goals OT Goal Formulation: Patient unable to participate in goal setting Time For Goal Achievement: 04/02/12 Potential to Achieve Goals: Good ADL Goals Pt Will Perform Eating: Independently;Sitting, chair ADL Goal: Eating - Progress: Progressing toward goals Pt Will Perform Grooming: with supervision;Standing at sink ADL Goal: Grooming - Progress: Progressing toward  goals Pt Will Perform Upper Body Bathing: with supervision;Sitting, edge of bed ADL Goal: Upper Body Bathing - Progress: Progressing toward goals Pt Will Perform Lower Body Bathing: with min assist;Sit to stand from bed ADL Goal: Lower Body Bathing - Progress: Met Pt Will Perform Upper Body Dressing: with supervision;Sitting, bed ADL Goal: Upper Body Dressing - Progress: Progressing toward goals Pt Will Perform Lower Body Dressing: with min assist;Sit to stand from bed ADL Goal: Lower Body Dressing - Progress: Met Pt Will Transfer to Toilet: with min assist;Ambulation;Regular height toilet ADL Goal: Toilet Transfer - Progress: Met Pt Will Perform Toileting - Clothing Manipulation: with supervision;Standing ADL Goal: Toileting - Clothing Manipulation - Progress: Progressing toward goals Pt Will Perform Toileting - Hygiene: with supervision;Sit to stand from 3-in-1/toilet ADL Goal: Toileting - Hygiene - Progress: Progressing toward goals Miscellaneous OT Goals Miscellaneous OT Goal #1: Pt will perform bed mobility with supervision in prep for ADL at EOB. OT Goal: Miscellaneous Goal #1 - Progress: Met  Visit Information  Last OT Received On: 03/23/12 Assistance Needed: +1    Subjective Data      Prior Functioning       Cognition  Overall Cognitive Status: History of cognitive impairments - at baseline Area of Impairment: Attention;Following commands;Safety/judgement;Awareness of errors;Awareness of deficits;Problem solving Arousal/Alertness: Awake/alert Orientation Level: Disoriented to;Time;Situation Behavior During Session: WFL for tasks performed Current Attention Level: Sustained Memory: Decreased recall of precautions Memory Deficits: pt with baseline dementia which is progressing rapidly Following Commands: Follows one step commands consistently Safety/Judgement: Decreased awareness of safety precautions;Decreased awareness of need for assistance;Decreased safety judgement  for tasks assessed Awareness of Errors: Assistance required to identify errors made;Assistance required to correct errors made Problem Solving: Min a functional basic    Mobility  Shoulder Instructions Bed  Mobility Bed Mobility: Supine to Sit;Sitting - Scoot to Edge of Bed Rolling Right: 5: Supervision Rolling Left: 5: Supervision Sitting - Scoot to Edge of Bed: 5: Supervision Transfers Transfers: Sit to Stand;Stand to Sit Sit to Stand: 4: Min assist;With upper extremity assist;From bed Stand to Sit: 4: Min assist;With upper extremity assist;To chair/3-in-1       Exercises      Balance  Min A. Unsteady gait   End of Session OT - End of Session Equipment Utilized During Treatment: Gait belt Activity Tolerance: Patient tolerated treatment well Patient left: in chair;with call bell/phone within reach;with family/visitor present Nurse Communication: Mobility status  GO     Jettson Crable,HILLARY 03/23/2012, 5:03 PM [facility], OTR/L  [phone] 03/23/2012

## 2012-03-23 NOTE — Progress Notes (Signed)
Orthost VS. Lying 138/60 hr 67 sitting 144/66 HR 66, standing 125/85 HR 74

## 2012-03-23 NOTE — Progress Notes (Signed)
Pt ambulated to bath with supervision did not have to help Pt. Gait is mildly unsteady. Will continue to monitor

## 2012-03-23 NOTE — Progress Notes (Signed)
Patient seen and examined by me.  Please see note by Toya Smothers.   Plan for SNF hopefully tomm.  Patient appears to have dementia and needs motivation to get out of bed.  Family updated at bedside.    Laura Colon

## 2012-03-23 NOTE — Clinical Social Work Placement (Addendum)
Clinical Social Work Department CLINICAL SOCIAL WORK PLACEMENT NOTE 03/23/2012  Patient:  Laura Colon, Laura Colon  Account Number:  [id number] Admit date:  03/17/2012  Clinical Social Worker:  AMY Lubertha Basque  Date/time:  03/23/2012 12:37 PM  Clinical Social Work is seeking post-discharge placement for this patient at the following level of care:   SKILLED NURSING   (*CSW will update this form in Epic as items are completed)   03/22/2012  Patient/family provided with [facility] Department of Clinical Social Work's list of facilities offering this level of care within the geographic area requested by the patient (or if unable, by the patient's family).  03/22/2012  Patient/family informed of their freedom to choose among providers that offer the needed level of care, that participate in Medicare, Medicaid or managed care program needed by the patient, have an available bed and are willing to accept the patient.  03/22/2012  Patient/family informed of [facility]' ownership interest in [facility], as well as of the fact that they are under no obligation to receive care at this facility.  PASARR submitted to EDS on 03/22/2012 PASARR number received from EDS on 03/22/2012  FL2 transmitted to all facilities in geographic area requested by pt/family on  03/22/2012 FL2 transmitted to all facilities within larger geographic area on   Patient informed that his/her managed care company has contracts with or will negotiate with  certain facilities, including the following:     Patient/family informed of bed offers received:  03/22/2012 Patient [facility] Physician recommends and patient chooses bed at  [facility]  [facility] to be [facility] on  03-24-12 Patient to be transferred to facility by [facility]  The following physician request were entered in Epic:   Additional  Comments:  Sabino Niemann, MSW, [employer] [phone]

## 2012-03-23 NOTE — Progress Notes (Signed)
[facility] PROGRESS NOTE  Laura Colon ZOX:[id number] DOB: 05-Oct-1923 DOA: 03/17/2012 PCP: Astrid Divine, MD  Assessment/Plan: 1.  #1 near-syncope  Questionable etiology likely secondary to hypotension secondary to volume depletion. Patient with no signs or symptoms of infection. MRI of the head is negative for acute stroke. Patient with hypotension overnight which responded to IV fluids. Cardiac enzymes negative x3. 2-D echo with normal EF of 55-60%. No wall motion abnormalities. Aortic valve with no stenosis. TSH = 1.376. Follow. IV fluids. No further episodes during this hospitalization. EKG without arrythmia #2 hypotension  Likely secondary to volume depletion ; patient with no signs or symptoms of infection. MRI of the head was negative. Cardiac enzymes negative x3. 2-D echo with normal EF of 55-60%. No wall motion abnormalities. Aortic valve with no stenosis. Blood pressure has responded to IV fluids. Remains stable with gently IV hydration. Pt need vigorous encouragement to take po fluids. Will discontinue IV fluids, check ortho statics and monitor.   #3 hypokalemia  Repeated. Resolved #4 generalized weakness  Family feels patient's generalized weakness likely secondary to patient recently been started on Namenda and also with poor oral intake. Patient is currently requiring two-person assist, and buckles when standing up per physical therapy. Family requested Namenda be discontinued. May be secondary to problem #2 versus debility versus worsening dementia. Cardiac enzymes negative x3. 2-D echo with normal EF of 55-60%. No wall motion abnormalities. Patient with no signs or symptoms of infection. PT/ OT.  Suspect this is progression of process. Awaiting SNF.   #5 abdominal pain/constipation  Secondary to constipation. Abdominal x-rays with fecal retention. Patient received an enema 2 days ago, however very small bowel movement. Patient not tolerating MiraLAX and cannot  drink it fully per nursing. Patient with large bowel movement after a dose of sorbitol on 03/21/12. Abdominal pain resolved. Will need close monitoring. Will continue bowel regimen. Colace.  #6 dementia  Family feels patient's condition may have worsened after being started on Namenda. Family requested that Namenda be discontinued and it was discontinued. RPR is nonreactive. B12 levels are within normal limits. Will have patient followup with PCP as outpatient for further evaluation and management.  #7 malnutrition/poor oral intake  Per nursing and family patient with approximately less than 30-40% of oral intake earlier on in hospitalization. Per family patient with improving oral intake.  Husband reports pt needs "alot of encouragement from family and usually will only eat if family around".Continue Ensure. Patient has been seen by dietician.  #8 right breast mass  Patient will likely benefit from a biopsy to determine what her right breast masses. Family initially refused biopsy as outpatient. Further followup with PCP as outpatient.     Code Status: full Family Communication: Husband at bedside Disposition Plan: SNF when bed available   Consultants:  none  Procedures: Chest x-ray 03/17/2012  2-D echo 03/18/2012  Abdominal x-ray 03/19/2012     Antibiotics: None HPI/Subjective: Lying in bed eyes closed. Will follow simple commands. Moans continuously "I want to go home". Denies pain  Objective: Filed Vitals:   03/22/12 0600 03/22/12 1449 03/22/12 2046 03/23/12 0501  BP: 155/75 137/49 138/59 144/61  Pulse: 67 66 71 69  Temp: 97.4 F (36.3 C) 97.7 F (36.5 C) 98.7 F (37.1 C) 97.8 F (36.6 C)  TempSrc: Oral Oral Oral Oral  Resp: 20 19 20 20   Height:      Weight: 59.1 kg (130 lb 4.7 oz)   53.5 kg (117 lb 15.1 oz)  SpO2: 96% 96% 95% 94%    Intake/Output Summary (Last 24 hours) at 03/23/12 0753 Last data filed at 03/23/12 0748  Gross per 24 hour  Intake 1428.33 ml    Output   2000 ml  Net -571.67 ml   Filed Weights   03/21/12 0623 03/22/12 0600 03/23/12 0501  Weight: 54.432 kg (120 lb) 59.1 kg (130 lb 4.7 oz) 53.5 kg (117 lb 15.1 oz)    Exam:   General:  Frail appearing, mildly distressed with moaning  Cardiovascular: RRR No MGR, No LEE PPP  Respiratory: Normal effort. BSCTAB No wheeze  Abdomen: soft, flat, +BS non-tender to palpation.  Data Reviewed: Basic Metabolic Panel:  Lab 03/22/12 0981 03/21/12 0850 03/21/12 0520 03/20/12 0650 03/19/12 0825  NA 141 141 143 144 144  K 3.6 3.7 3.6 3.5 3.7  CL 106 106 107 107 108  CO2 28 24 26 27 24   GLUCOSE 103* 98 91 95 98  BUN 9 10 11 9 10   CREATININE 0.67 0.64 0.66 0.69 0.60  CALCIUM 8.6 8.9 8.9 9.2 9.2  MG -- -- 2.0 -- --  PHOS -- -- -- -- --   Liver Function Tests:  Lab 03/21/12 0850 03/17/12 0942  AST -- 16  ALT -- 11  ALKPHOS -- 78  BILITOT -- 0.6  PROT -- 6.7  ALBUMIN 3.2* 3.6   No results found for this basename: LIPASE:5,AMYLASE:5 in the last 168 hours No results found for this basename: AMMONIA:5 in the last 168 hours CBC:  Lab 03/19/12 0825 03/18/12 0937 03/18/12 0811 03/17/12 0942  WBC 7.8 9.5 10.4 6.7  NEUTROABS -- -- -- 4.6  HGB 13.1 12.6 12.9 13.7  HCT 39.8 38.5 39.3 40.7  MCV 88.2 89.3 88.9 88.9  PLT 243 228 225 253   Cardiac Enzymes:  Lab 03/18/12 2107 03/18/12 1550 03/18/12 0936  CKTOTAL -- -- --  CKMB -- -- --  CKMBINDEX -- -- --  TROPONINI <0.30 <0.30 <0.30   BNP (last 3 results) No results found for this basename: PROBNP:3 in the last 8760 hours CBG:  Lab 03/17/12 2112  GLUCAP 197*    Recent Results (from the past 240 hour(s))  URINE CULTURE     Status: Normal   Collection Time   03/17/12 10:57 AM      Component Value Range Status Comment   Specimen Description URINE, CATHETERIZED   Final    Special Requests NONE   Final    Culture  Setup Time 03/17/2012 11:56   Final    Colony Count NO GROWTH   Final    Culture NO GROWTH   Final     Report Status 03/18/2012 FINAL   Final      Studies: No results found.  Scheduled Meds:   . aspirin EC  81 mg Oral Daily  . calcium-vitamin D  1 tablet Oral Daily  . docusate sodium  100 mg Oral BID  . enoxaparin (LOVENOX) injection  40 mg Subcutaneous Q24H  . feeding supplement  237 mL Oral BID BM  . multivitamin with minerals  1 tablet Oral Daily  . sodium chloride  3 mL Intravenous Q12H   Continuous Infusions:   . sodium chloride 50 mL/hr at 03/22/12 1257    Principal Problem:  *Near syncope Active Problems:  Dementia  Hypotension  Hypokalemia  Weakness generalized  Breast mass, right  Abdominal pain  Constipation  Malnutrition  Inadequate oral intake    Time spent: 30 minutes    [place]  M  [facility]  If 8PM-8AM, please contact night-coverage at [email or web address], password [facility] 03/23/2012, 7:53 AM  LOS: 6 days

## 2012-03-24 DIAGNOSIS — I959 Hypotension, unspecified: Secondary | ICD-10-CM | POA: Diagnosis not present

## 2012-03-24 DIAGNOSIS — F0390 Unspecified dementia without behavioral disturbance: Secondary | ICD-10-CM | POA: Diagnosis not present

## 2012-03-24 DIAGNOSIS — N63 Unspecified lump in unspecified breast: Secondary | ICD-10-CM | POA: Diagnosis not present

## 2012-03-24 DIAGNOSIS — IMO0002 Reserved for concepts with insufficient information to code with codable children: Secondary | ICD-10-CM | POA: Diagnosis not present

## 2012-03-24 DIAGNOSIS — M25549 Pain in joints of unspecified hand: Secondary | ICD-10-CM | POA: Diagnosis not present

## 2012-03-24 DIAGNOSIS — R638 Other symptoms and signs concerning food and fluid intake: Secondary | ICD-10-CM

## 2012-03-24 DIAGNOSIS — Z5189 Encounter for other specified aftercare: Secondary | ICD-10-CM | POA: Diagnosis not present

## 2012-03-24 DIAGNOSIS — R5381 Other malaise: Secondary | ICD-10-CM | POA: Diagnosis not present

## 2012-03-24 DIAGNOSIS — R55 Syncope and collapse: Secondary | ICD-10-CM | POA: Diagnosis not present

## 2012-03-24 DIAGNOSIS — E876 Hypokalemia: Secondary | ICD-10-CM | POA: Diagnosis not present

## 2012-03-24 DIAGNOSIS — R109 Unspecified abdominal pain: Secondary | ICD-10-CM | POA: Diagnosis not present

## 2012-03-24 MED ORDER — ENSURE COMPLETE PO LIQD: 237.0000 mL | Freq: Two times a day (BID) | ORAL | Status: DC

## 2012-03-24 MED ORDER — SIMETHICONE 80 MG PO CHEW: 80.0000 mg | CHEWABLE_TABLET | Freq: Four times a day (QID) | ORAL | Status: DC | PRN

## 2012-03-24 MED ORDER — IBUPROFEN 400 MG PO TABS: 400.0000 mg | ORAL_TABLET | Freq: Four times a day (QID) | ORAL | Status: DC | PRN

## 2012-03-24 MED ORDER — ASPIRIN 81 MG PO TBEC: 81.0000 mg | DELAYED_RELEASE_TABLET | Freq: Every day | ORAL | Status: DC

## 2012-03-24 MED ORDER — DSS 100 MG PO CAPS: 100.0000 mg | ORAL_CAPSULE | Freq: Two times a day (BID) | ORAL | Status: DC

## 2012-03-24 NOTE — Clinical Social Work Note (Addendum)
Clinical Social Worker facilitated discharge by attempting to reach family. CSW left a voice message of daughter's phone regarding discharge today. CSW contacted [facility] regarding discharge today. Patient's discharge packet will be placed in designated room number at nurses station. [facility] will [facility].   Rozetta Nunnery MSW, [employer] (Covering [place]) [phone]

## 2012-03-24 NOTE — Progress Notes (Signed)
Physical Therapy Treatment Patient Details Name: Laura Colon MRN: [id number] DOB: 1923/08/16 Today's Date: 03/24/2012 Time: 2130-8657 PT Time Calculation (min): 12 min  PT Assessment / Plan / Recommendation Comments on Treatment Session  Pt able to ambulate into hallway today with max encouragement throughout entire session.  Once pt gets moving, she moves fairly well but is unsafe as evident in will just "collapse" at any given time without warning.      Follow Up Recommendations  Post acute inpatient;Supervision/Assistance - 24 hour;Home health PT     Does the patient have the potential to tolerate intense rehabilitation  No, Recommend SNF  Barriers to Discharge        Equipment Recommendations  3 in 1 bedside comode;Rolling walker with 5" wheels;Wheelchair (measurements)    Recommendations for Other Services    Frequency Min 3X/week   Plan      Precautions / Restrictions Precautions Precautions: Fall Restrictions Weight Bearing Restrictions: No   Pertinent Vitals/Pain C/o LBP.  Premedicated.  Repositioned.      Mobility  Bed Mobility Bed Mobility: Rolling Right;Right Sidelying to Sit;Sitting - Scoot to Edge of Bed Rolling Right: 5: Supervision Right Sidelying to Sit: 4: Min assist;HOB flat Sitting - Scoot to Edge of Bed: 4: Min guard Details for Bed Mobility Assistance: Cues for sequencing & technique.  (A) to lift shoudlers/trunk to sitting upright.  While scooting to EOB pt with her "limp" like posture & requires close guarding & cues for safety.   Transfers Transfers: Sit to Stand;Stand to Sit Sit to Stand: 4: Min assist;From bed Stand to Sit: 4: Min assist Details for Transfer Assistance: Max encouragement to stand.  (A) to achieve standing, balance, postural control due to pt "limp", & controlled descent.   Ambulation/Gait Ambulation/Gait Assistance: 3: Mod assist Ambulation Distance (Feet): 70 Feet Assistive device: Rolling walker Ambulation/Gait  Assistance Details: Max encouragement & cues for safety throughout session.  Once pt gets going she does fairly well & demonstrates good postural control but as soon as she stops she "collapses" into therapist.  Requires (A) to keep her moving.  (A) for balance, RW management, & safety.  Entire distance pt perseverating on "I can't. I want to go home".   Gait Pattern: Step-through pattern;Decreased stride length;Shuffle Stairs: No Wheelchair Mobility Wheelchair Mobility: No     PT Goals Acute Rehab PT Goals Time For Goal Achievement: 04/01/12 Potential to Achieve Goals: Fair Pt will go Sit to Stand: with modified independence PT Goal: Sit to Stand - Progress: Not met Pt will go Stand to Sit: with modified independence PT Goal: Stand to Sit - Progress: Not met Pt will Transfer Bed to Chair/Chair to Bed: with supervision Pt will Ambulate: 51 - 150 feet;with supervision;with least restrictive assistive device PT Goal: Ambulate - Progress: Progressing toward goal  Visit Information  Last PT Received On: 03/24/12 Assistance Needed: +1    Subjective Data  Subjective: "I can't.  I just want to go home"  perseverating on this.     Cognition  Overall Cognitive Status: History of cognitive impairments - at baseline Area of Impairment: Attention;Following commands;Safety/judgement;Awareness of errors;Awareness of deficits Arousal/Alertness: Lethargic Behavior During Session: Lethargic Current Attention Level: Sustained Memory: Decreased recall of precautions Memory Deficits: pt with baseline dementia which is progressing rapidly Following Commands: Follows one step commands consistently Safety/Judgement: Decreased awareness of safety precautions;Decreased safety judgement for tasks assessed;Impulsive;Decreased awareness of need for assistance Awareness of Errors: Assistance required to identify errors made;Assistance required to correct errors  made Cognition - Other Comments: Pt  peseverating on going home    Balance     End of Session PT - End of Session Equipment Utilized During Treatment: Gait belt Activity Tolerance: Patient limited by fatigue;Other (comment) (cognition (dementia)) Patient left: in chair;with call bell/phone within reach Nurse Communication: Mobility status     Verdell Face, [place] [phone] 03/24/2012

## 2012-03-24 NOTE — Discharge Summary (Signed)
Physician Discharge Summary  Laura Colon Inks NWG:[id number] DOB: 23-Dec-1923 DOA: 03/17/2012  PCP: Astrid Divine, MD  Admit date: 03/17/2012 Discharge date: 03/24/2012  Recommendations for Outpatient Follow-up:  1. Discharge to SNF. Will need daily PT/OT. Follow up with PCP in 1 week. Monitor intake and output  Discharge Diagnoses:  Principal Problem:  *Near syncope Active Problems:  Dementia  Hypotension  Hypokalemia  Weakness generalized  Breast mass, right  Abdominal pain  Constipation  Malnutrition  Inadequate oral intake   Discharge Condition: medically stable and ready for discharge to SNF  Diet recommendation: regular  Filed Weights   03/22/12 0600 03/23/12 0501 03/24/12 0550  Weight: 59.1 kg (130 lb 4.7 oz) 53.5 kg (117 lb 15.1 oz) 57.7 kg (127 lb 3.3 oz)    History of present illness:  78 year old female with history off breast cancer about 40 years ago status post left mastectomy with what appears like progressive dementia over past few months was brought to ED 03/17/12 by her husband for a near-syncopal event while she was preparing tea at home. As per the husband she was in the kitchen and became shaky and slumped on the ground without hitting her head or passing out. He then called 911 and brought her to the hospital. Patient had no recollection of the event and was very demented and completely not oriented. On discussing with her PCP Dr. Valentina Lucks patient was seen about 6 week back for the first time for progressive dementia and she has started her on Namenda. She also had a mammogram done which was suggestive of calcifications and a right breast ultrasound was done which showed a suspicious mass was recommended for a core biopsy which she and the husband refused.  Patient denied any chest pain, palpitations, shortness of breath, headache, dizziness, abdominal pain, nausea, vomiting, bowel or urinary symptoms.  As per her husband she is usually home bound  and is able to perform most of her ADLs. As per the PCP's instructions she has driving for past few weeks. He denied patient having any other medical illnesses and being on any other medications.  The ED her vitals were stable at work was unremarkable. A chest x-ray obtained was unremarkable. UA was normal. EKG showed left bundle branch block but no old EKG was available for comparison. A head CT showed some microvascular disease with a possible recent left-sided small infarct. Hospitalist called in to admit patient under observation.   Hospital Course:  #1 near-syncope  Questionable etiology likely secondary to hypotension secondary to volume depletion. Patient with no signs or symptoms of infection. MRI of the head is negative for acute stroke. Patient with hypotension overnight which responded to IV fluids. Cardiac enzymes negative x3. 2-D echo with normal EF of 55-60%. No wall motion abnormalities. Aortic valve with no stenosis. TSH = 1.376.  No further episodes during this hospitalization. EKG without arrythmia .  #2 hypotension  Likely secondary to volume depletion ; patient with no signs or symptoms of infection. MRI of the head was negative. Cardiac enzymes negative x3. 2-D echo with normal EF of 55-60%. No wall motion abnormalities. Aortic valve with no stenosis. Blood pressure has responded to IV fluids.  BP remained stable with gently IV hydration. Without IV fluids SBP range 109-134. Range same at discharge.  Pt need vigorous encouragement to take po fluids and maintain hydration. Mildly orthostatic from sitting to standing.   #3 hypokalemia  Repeated. Resolved  #4 generalized weakness  Family feels patient's generalized weakness likely  secondary to patient recently been started on Namenda and also with poor oral intake. Patient at discharge required one-person assist. Family requested Namenda be discontinued. May be secondary to problem #2 versus debility versus worsening dementia. Cardiac  enzymes negative x3. 2-D echo with normal EF of 55-60%. No wall motion abnormalities. Patient with no signs or symptoms of infection. PT/ OT. Suspect this is progression of process.   #5 abdominal pain/constipation  Secondary to constipation. Abdominal x-rays with fecal retention. Patient received an enema 2 days ago, however very small bowel movement. Patient not tolerating MiraLAX and cannot drink it fully per nursing. Patient with large bowel movement after a dose of sorbitol on 03/21/12. Abdominal pain resolved. Will need close monitoring. Will continue bowel regimen. Colace.  #6 dementia  Family feels patient's condition may have worsened after being started on Namenda. Family requested that Namenda be discontinued and it was discontinued. RPR is nonreactive. B12 levels are within normal limits. Will have patient followup with PCP as outpatient for further evaluation and management.  #7 malnutrition/poor oral intake  Per nursing and family patient with approximately less than 30-40% of oral intake earlier on in hospitalization. Per family patient with improving oral intake. Husband reports pt needs "alot of encouragement from family and usually will only eat if family around".Continue Ensure. Patient has been seen by dietician.  #8 right breast mass  Patient will likely benefit from a biopsy to determine what her right breast masses. Family initially refused biopsy as outpatient. Further followup with PCP as outpatient.       Procedures: Chest x-ray 03/17/2012  2-D echo 03/18/2012  Abdominal x-ray 03/19/2012  Consultations:  none  Discharge Exam: Filed Vitals:   03/23/12 0850 03/23/12 1442 03/23/12 2032 03/24/12 0550  BP: 125/85 109/51 117/50 134/77  Pulse: 74 71 64 76  Temp:  99.2 F (37.3 C) 98.3 F (36.8 C) 98 F (36.7 C)  TempSrc:  Oral Oral Oral  Resp:  19 20 20   Height:      Weight:    57.7 kg (127 lb 3.3 oz)  SpO2: 96% 94% 93% 96%    General: awake lying in bed  frail appearing denies pain Cardiovascular: RRR No MGR NO LEE PPP Respiratory: normal effort BSCTAB no wheeze  Discharge Instructions  Discharge Orders    Future Orders Please Complete By Expires   Diet general      Increase activity slowly      Call MD for:  persistant nausea and vomiting      Call MD for:  difficulty breathing, headache or visual disturbances          Medication List     As of 03/24/2012  9:58 AM    STOP taking these medications         NAMENDA XR 28 MG Cp24   Generic drug: Memantine HCl ER      TAKE these medications         aspirin 81 MG EC tablet   Take 1 tablet (81 mg total) by mouth daily.      calcium-vitamin D 500-200 MG-UNIT per tablet   Commonly known as: OSCAL WITH D   Take 1 tablet by mouth daily.      DSS 100 MG Caps   Take 100 mg by mouth 2 (two) times daily.      feeding supplement Liqd   Take 237 mLs by mouth 2 (two) times daily between meals.      ibuprofen 400 MG tablet  Commonly known as: ADVIL,MOTRIN   Take 1 tablet (400 mg total) by mouth every 6 (six) hours as needed.      multivitamin with minerals Tabs   Take 1 tablet by mouth daily.      simethicone 80 MG chewable tablet   Commonly known as: MYLICON   Chew 1 tablet (80 mg total) by mouth every 6 (six) hours as needed for flatulence.           Follow-up Information    Follow up with Astrid Divine, MD. Schedule an appointment as soon as possible for a visit in 1 week.   Contact information:   [address], [address] [place] [address] [phone]           The results of significant diagnostics from this hospitalization (including imaging, microbiology, ancillary and laboratory) are listed below for reference.    Significant Diagnostic Studies: Dg Chest 2 View  03/17/2012  *RADIOLOGY REPORT*  Clinical Data: Dizziness.  Near syncope.  CHEST - 2 VIEW  Comparison: None  Findings: Mild hyperinflation of the lungs.  Biapical densities,  likely scarring.  Heart is upper limits normal in size.  No acute opacities or effusions.  No acute bony abnormality.  IMPRESSION: COPD/chronic changes.  No active disease.   Original Report Authenticated By: Cyndie Chime, M.D.    Dg Abd 1 View  03/19/2012  *RADIOLOGY REPORT*  Clinical Data: Abdominal pain  ABDOMEN - 1 VIEW  Comparison: None.  Findings: There are no abnormally dilated loops of bowel.  There is stool throughout the entire colon, in the rectum is mildly distended with gas and stool.  IMPRESSION: Significant fecal retention.   Original Report Authenticated By: Otilio Carpen, M.D.    Ct Head Wo Contrast  03/17/2012  *RADIOLOGY REPORT*  Clinical Data: Near syncope and confusion  CT HEAD WITHOUT CONTRAST  Technique:  Contiguous axial images were obtained from the base of the skull through the vertex without contrast. Study was obtained within 24 hours of patient arrival at the emergency department.  Comparison: None.  Findings: There is age-related volume loss.  There is no mass, hemorrhage, extra-axial fluid collection, or midline shift.  There is patchy small vessel disease throughout the centra semiovale bilaterally. There is decreased attenuation medial to the sylvian fissure on the left which appears asymmetric compared the right.  This finding may represent a small recent infarct.  No other evidence suggesting recent infarct seen.  Bony calvarium appears intact.  Mastoid air cells are clear.  IMPRESSION: Moderate supratentorial small vessel disease. Question recent small infarct on the left slightly medial to the sylvian fissure. No other findings suspicious for recent infarct.  No hemorrhage or mass effect.   Original Report Authenticated By: Arvin Collard. WOODRUFF III, M.D.    Mr Maxine Glenn Head Wo Contrast  03/18/2012  *RADIOLOGY REPORT*  Clinical Data:  CVA  MRI HEAD WITHOUT CONTRAST MRA HEAD WITHOUT CONTRAST  Technique:  Multiplanar, multiecho pulse sequences of the brain and  surrounding structures were obtained without intravenous contrast. Angiographic images of the head were obtained using MRA technique without contrast.  Comparison:  CT head 03/17/2012  MRI HEAD  Findings:  The patient was very claustrophobic and was not able to complete the study.  An incomplete study was performed.  Diffusion weighted imaging is satisfactory for diagnosis.  Images obtained are degraded by patient motion.  Negative for acute infarct.  Moderate chronic ischemic changes throughout the cerebral white matter bilaterally.  Ventricle size is normal.  No mass or fluid collection is identified.  IMPRESSION: Incomplete study due to claustrophobia.  No acute infarct.  MRA HEAD  Findings: Image quality degraded by motion.  Two attempts were made.  The better attempt is marginal for diagnosis.  Left vertebral artery is patent to the basilar and is dominant. Hypoplastic distal right vertebral artery.  Basilar is patent with misregistration artifact noted in the basilar and carotid arteries. Posterior cerebral arteries are patent bilaterally.  Internal carotid artery is patent bilaterally with misregistration artifact due to motion.  Anterior and middle cerebral arteries are patent bilaterally with irregularity which could be due to motion or atherosclerotic disease.  No large vessel occlusion.  No large aneurysm is identified.  IMPRESSION: Image quality degraded by motion.  No large vessel occlusion or critical stenosis.   Original Report Authenticated By: Camelia Phenes, M.D.    Mr Brain Wo Contrast  03/18/2012  *RADIOLOGY REPORT*  Clinical Data:  CVA  MRI HEAD WITHOUT CONTRAST MRA HEAD WITHOUT CONTRAST  Technique:  Multiplanar, multiecho pulse sequences of the brain and surrounding structures were obtained without intravenous contrast. Angiographic images of the head were obtained using MRA technique without contrast.  Comparison:  CT head 03/17/2012  MRI HEAD  Findings:  The patient was very  claustrophobic and was not able to complete the study.  An incomplete study was performed.  Diffusion weighted imaging is satisfactory for diagnosis.  Images obtained are degraded by patient motion.  Negative for acute infarct.  Moderate chronic ischemic changes throughout the cerebral white matter bilaterally.  Ventricle size is normal.  No mass or fluid collection is identified.  IMPRESSION: Incomplete study due to claustrophobia.  No acute infarct.  MRA HEAD  Findings: Image quality degraded by motion.  Two attempts were made.  The better attempt is marginal for diagnosis.  Left vertebral artery is patent to the basilar and is dominant. Hypoplastic distal right vertebral artery.  Basilar is patent with misregistration artifact noted in the basilar and carotid arteries. Posterior cerebral arteries are patent bilaterally.  Internal carotid artery is patent bilaterally with misregistration artifact due to motion.  Anterior and middle cerebral arteries are patent bilaterally with irregularity which could be due to motion or atherosclerotic disease.  No large vessel occlusion.  No large aneurysm is identified.  IMPRESSION: Image quality degraded by motion.  No large vessel occlusion or critical stenosis.   Original Report Authenticated By: Camelia Phenes, M.D.    US Breast Right  03/11/2012  *RADIOLOGY REPORT*  Clinical Data:  History of left lumpectomy, baseline right mammogram recently, for further evaluation of right asymmetry  DIGITAL DIAGNOSTIC RIGHT MAMMOGRAM  AND RIGHT BREAST ULTRASOUND:  Comparison:  03/02/2012  Findings:  There is a persistent 1.2 cm oval hyperdense mass posterior right 2 o'clock position with ill-defined margins.  On physical exam, there are no palpable abnormalities.  Ultrasound is performed, showing an oval hypoechoic to isoechoic and heterogenous mass causing mild posterior acoustic shadowing. There is a mild echogenic rim of tissue around the mass.  This mass is located in the 2  o'clock position of the right breast 5 cm from the nipple, and it measures 12 x 5 x 9 mm.  IMPRESSION: Suspicious mass 2 o'clock position right breast  BI-RADS CATEGORY 4:  Suspicious abnormality - biopsy should be considered.  RECOMMENDATION: I spoke extensively with the patient and her husband and encouraged ultrasound-guided core needle biopsy.  The patient was hesitant undergo this  procedure, and I asked to consider 45-month follow-up mammogram and ultrasound at the very least if they refused biopsy. I subsequently spoke with Dr. Manus Gunning by telephone at 1440 and discussed this patient.   Original Report Authenticated By: Otilio Carpen, M.D.    Mm Digital Diag Ltd R  03/11/2012  *RADIOLOGY REPORT*  Clinical Data:  History of left lumpectomy, baseline right mammogram recently, for further evaluation of right asymmetry  DIGITAL DIAGNOSTIC RIGHT MAMMOGRAM  AND RIGHT BREAST ULTRASOUND:  Comparison:  03/02/2012  Findings:  There is a persistent 1.2 cm oval hyperdense mass posterior right 2 o'clock position with ill-defined margins.  On physical exam, there are no palpable abnormalities.  Ultrasound is performed, showing an oval hypoechoic to isoechoic and heterogenous mass causing mild posterior acoustic shadowing. There is a mild echogenic rim of tissue around the mass.  This mass is located in the 2 o'clock position of the right breast 5 cm from the nipple, and it measures 12 x 5 x 9 mm.  IMPRESSION: Suspicious mass 2 o'clock position right breast  BI-RADS CATEGORY 4:  Suspicious abnormality - biopsy should be considered.  RECOMMENDATION: I spoke extensively with the patient and her husband and encouraged ultrasound-guided core needle biopsy.  The patient was hesitant undergo this procedure, and I asked to consider 48-month follow-up mammogram and ultrasound at the very least if they refused biopsy. I subsequently spoke with Dr. Manus Gunning by telephone at 1440 and discussed this patient.   Original Report  Authenticated By: Otilio Carpen, M.D.    Mm Digital Screening Unilat R  03/08/2012  **ADDENDUM** CREATED: 03/08/2012 08:28:01  *RADIOLOGY REPORT*  Clinical data: Screening.  History of left breast cancer and left mastectomy.  MAMMOGRAPHIC UNILATERAL RIGHT DIGITAL SCREENING WITH CAD  Comparison: None.  Findings: The patient has had a left mastectomy. Two views of the right breast demonstrate heterogeneously dense tissue.  In the right breast, a possible mass warrants further evaluation with spot compression views and possibly ultrasound.  Images were processed with CAD.  IMPRESSION: Further evaluation is suggested for possible mass in the right breast.  RECOMMENDATION: Diagnostic mammogram and possibly ultrasound of the right breast. (Code:FI-R-58M)  BI-RADS CATEGORY 0:  Incomplete.  Need additional imaging evaluation and/or prior mammograms for comparison.  Addended by:  Elpidio Eric. Si Gaul, M.D. on 03/08/2012 08:28:01.  **END ADDENDUM** SIGNED BY: Tinnie Gens T. Si Gaul, M.D.   03/03/2012  *RADIOLOGY REPORT*  Clinical Data: Screening.  BILATERAL DIGITAL SCREENING MAMMOGRAM WITH CAD  Findings:  Prior films are needed for interpretation.  IMPRESSION: Prior exams will be requested for comparison.  Following comparison with prior studies an addendum will be made regarding further recommendations.  Images were processed with CAD.  BI-RADS CATEGORY 0:  Incomplete.  Need additional imaging evaluation and/or prior mammograms for comparison.   Original Report Authenticated By: Rosendo Gros, M.D.     Microbiology: Recent Results (from the past 240 hour(s))  URINE CULTURE     Status: Normal   Collection Time   03/17/12 10:57 AM      Component Value Range Status Comment   Specimen Description URINE, CATHETERIZED   Final    Special Requests NONE   Final    Culture  Setup Time 03/17/2012 11:56   Final    Colony Count NO GROWTH   Final    Culture NO GROWTH   Final    Report Status 03/18/2012 FINAL   Final       Labs: Basic Metabolic Panel:  Lab 03/22/12 0557 03/21/12 0850 03/21/12 0520 03/20/12 0650 03/19/12 0825  NA 141 141 143 144 144  K 3.6 3.7 3.6 3.5 3.7  CL 106 106 107 107 108  CO2 28 24 26 27 24   GLUCOSE 103* 98 91 95 98  BUN 9 10 11 9 10   CREATININE 0.67 0.64 0.66 0.69 0.60  CALCIUM 8.6 8.9 8.9 9.2 9.2  MG -- -- 2.0 -- --  PHOS -- -- -- -- --   Liver Function Tests:  Lab 03/21/12 0850  AST --  ALT --  ALKPHOS --  BILITOT --  PROT --  ALBUMIN 3.2*   No results found for this basename: LIPASE:5,AMYLASE:5 in the last 168 hours No results found for this basename: AMMONIA:5 in the last 168 hours CBC:  Lab 03/19/12 0825 03/18/12 0937 03/18/12 0811  WBC 7.8 9.5 10.4  NEUTROABS -- -- --  HGB 13.1 12.6 12.9  HCT 39.8 38.5 39.3  MCV 88.2 89.3 88.9  PLT 243 228 225   Cardiac Enzymes:  Lab 03/18/12 2107 03/18/12 1550 03/18/12 0936  CKTOTAL -- -- --  CKMB -- -- --  CKMBINDEX -- -- --  TROPONINI <0.30 <0.30 <0.30   BNP: BNP (last 3 results) No results found for this basename: PROBNP:3 in the last 8760 hours CBG:  Lab 03/17/12 2112  GLUCAP 197*    Time coordinating discharge: 30  minutes  Signed:  Gwenyth Bender NP [facility] 03/24/2012, 9:58 AM

## 2012-03-24 NOTE — Discharge Summary (Signed)
Patient seems to do better in the afternoon.  Requires quite a bit of encouragement to eat and move.   Marlin Canary DO

## 2012-03-25 DIAGNOSIS — F0390 Unspecified dementia without behavioral disturbance: Secondary | ICD-10-CM | POA: Diagnosis not present

## 2012-03-25 DIAGNOSIS — R5381 Other malaise: Secondary | ICD-10-CM | POA: Diagnosis not present

## 2012-03-25 DIAGNOSIS — IMO0002 Reserved for concepts with insufficient information to code with codable children: Secondary | ICD-10-CM | POA: Diagnosis not present

## 2012-04-07 DIAGNOSIS — IMO0002 Reserved for concepts with insufficient information to code with codable children: Secondary | ICD-10-CM | POA: Diagnosis not present

## 2012-04-12 DIAGNOSIS — I959 Hypotension, unspecified: Secondary | ICD-10-CM | POA: Diagnosis not present

## 2012-04-12 DIAGNOSIS — Z853 Personal history of malignant neoplasm of breast: Secondary | ICD-10-CM | POA: Diagnosis not present

## 2012-04-12 DIAGNOSIS — F0390 Unspecified dementia without behavioral disturbance: Secondary | ICD-10-CM | POA: Diagnosis not present

## 2012-04-12 DIAGNOSIS — Z5189 Encounter for other specified aftercare: Secondary | ICD-10-CM | POA: Diagnosis not present

## 2012-04-12 DIAGNOSIS — R269 Unspecified abnormalities of gait and mobility: Secondary | ICD-10-CM | POA: Diagnosis not present

## 2012-04-12 DIAGNOSIS — R55 Syncope and collapse: Secondary | ICD-10-CM | POA: Diagnosis not present

## 2012-04-13 DIAGNOSIS — F0390 Unspecified dementia without behavioral disturbance: Secondary | ICD-10-CM | POA: Diagnosis not present

## 2012-04-13 DIAGNOSIS — I959 Hypotension, unspecified: Secondary | ICD-10-CM | POA: Diagnosis not present

## 2012-04-13 DIAGNOSIS — Z5189 Encounter for other specified aftercare: Secondary | ICD-10-CM | POA: Diagnosis not present

## 2012-04-13 DIAGNOSIS — R269 Unspecified abnormalities of gait and mobility: Secondary | ICD-10-CM | POA: Diagnosis not present

## 2012-04-13 DIAGNOSIS — R55 Syncope and collapse: Secondary | ICD-10-CM | POA: Diagnosis not present

## 2012-04-14 DIAGNOSIS — R55 Syncope and collapse: Secondary | ICD-10-CM | POA: Diagnosis not present

## 2012-04-14 DIAGNOSIS — IMO0002 Reserved for concepts with insufficient information to code with codable children: Secondary | ICD-10-CM | POA: Diagnosis not present

## 2012-04-14 DIAGNOSIS — I959 Hypotension, unspecified: Secondary | ICD-10-CM | POA: Diagnosis not present

## 2012-04-14 DIAGNOSIS — R269 Unspecified abnormalities of gait and mobility: Secondary | ICD-10-CM | POA: Diagnosis not present

## 2012-04-14 DIAGNOSIS — F0390 Unspecified dementia without behavioral disturbance: Secondary | ICD-10-CM | POA: Diagnosis not present

## 2012-04-14 DIAGNOSIS — Z5189 Encounter for other specified aftercare: Secondary | ICD-10-CM | POA: Diagnosis not present

## 2012-04-19 DIAGNOSIS — Z5189 Encounter for other specified aftercare: Secondary | ICD-10-CM | POA: Diagnosis not present

## 2012-04-19 DIAGNOSIS — R269 Unspecified abnormalities of gait and mobility: Secondary | ICD-10-CM | POA: Diagnosis not present

## 2012-04-19 DIAGNOSIS — F0390 Unspecified dementia without behavioral disturbance: Secondary | ICD-10-CM | POA: Diagnosis not present

## 2012-04-19 DIAGNOSIS — R55 Syncope and collapse: Secondary | ICD-10-CM | POA: Diagnosis not present

## 2012-04-19 DIAGNOSIS — I959 Hypotension, unspecified: Secondary | ICD-10-CM | POA: Diagnosis not present

## 2012-04-20 DIAGNOSIS — R269 Unspecified abnormalities of gait and mobility: Secondary | ICD-10-CM | POA: Diagnosis not present

## 2012-04-20 DIAGNOSIS — I959 Hypotension, unspecified: Secondary | ICD-10-CM | POA: Diagnosis not present

## 2012-04-20 DIAGNOSIS — Z5189 Encounter for other specified aftercare: Secondary | ICD-10-CM | POA: Diagnosis not present

## 2012-04-20 DIAGNOSIS — R55 Syncope and collapse: Secondary | ICD-10-CM | POA: Diagnosis not present

## 2012-04-20 DIAGNOSIS — F0390 Unspecified dementia without behavioral disturbance: Secondary | ICD-10-CM | POA: Diagnosis not present

## 2012-04-27 DIAGNOSIS — IMO0002 Reserved for concepts with insufficient information to code with codable children: Secondary | ICD-10-CM | POA: Diagnosis not present

## 2012-05-03 DIAGNOSIS — IMO0002 Reserved for concepts with insufficient information to code with codable children: Secondary | ICD-10-CM | POA: Diagnosis not present

## 2012-05-10 DIAGNOSIS — IMO0002 Reserved for concepts with insufficient information to code with codable children: Secondary | ICD-10-CM | POA: Diagnosis not present

## 2012-05-10 DIAGNOSIS — F0390 Unspecified dementia without behavioral disturbance: Secondary | ICD-10-CM | POA: Diagnosis not present

## 2012-05-12 DIAGNOSIS — IMO0002 Reserved for concepts with insufficient information to code with codable children: Secondary | ICD-10-CM | POA: Diagnosis not present

## 2012-05-17 DIAGNOSIS — IMO0002 Reserved for concepts with insufficient information to code with codable children: Secondary | ICD-10-CM | POA: Diagnosis not present

## 2012-06-02 DIAGNOSIS — IMO0002 Reserved for concepts with insufficient information to code with codable children: Secondary | ICD-10-CM | POA: Diagnosis not present

## 2012-06-16 DIAGNOSIS — IMO0002 Reserved for concepts with insufficient information to code with codable children: Secondary | ICD-10-CM | POA: Diagnosis not present

## 2012-06-20 DIAGNOSIS — IMO0002 Reserved for concepts with insufficient information to code with codable children: Secondary | ICD-10-CM | POA: Diagnosis not present

## 2012-06-30 DIAGNOSIS — IMO0002 Reserved for concepts with insufficient information to code with codable children: Secondary | ICD-10-CM | POA: Diagnosis not present

## 2012-08-10 ENCOUNTER — Ambulatory Visit
Admission: RE | Admit: 2012-08-10 | Discharge: 2012-08-10 | Disposition: A | Discharge status: Home / Self Care | Payer: Medicare Other | Source: Ambulatory Visit | Attending: Family Medicine | Admitting: Family Medicine

## 2012-08-10 ENCOUNTER — Other Ambulatory Visit: Payer: Self-pay | Admitting: Family Medicine

## 2012-12-14 ENCOUNTER — Other Ambulatory Visit: Payer: Self-pay | Admitting: Family Medicine

## 2012-12-14 DIAGNOSIS — N63 Unspecified lump in unspecified breast: Secondary | ICD-10-CM

## 2013-02-01 DIAGNOSIS — F0390 Unspecified dementia without behavioral disturbance: Secondary | ICD-10-CM | POA: Diagnosis not present

## 2013-02-01 DIAGNOSIS — I10 Essential (primary) hypertension: Secondary | ICD-10-CM | POA: Diagnosis not present

## 2013-02-01 DIAGNOSIS — N63 Unspecified lump in unspecified breast: Secondary | ICD-10-CM | POA: Diagnosis not present

## 2013-02-10 ENCOUNTER — Ambulatory Visit
Admission: RE | Admit: 2013-02-10 | Discharge: 2013-02-10 | Disposition: A | Discharge status: Home / Self Care | Payer: Medicare Other | Source: Ambulatory Visit | Attending: Family Medicine | Admitting: Family Medicine

## 2013-02-10 ENCOUNTER — Other Ambulatory Visit: Payer: Self-pay | Admitting: Family Medicine

## 2013-02-10 DIAGNOSIS — N63 Unspecified lump in unspecified breast: Secondary | ICD-10-CM

## 2013-02-21 ENCOUNTER — Other Ambulatory Visit: Payer: Self-pay | Admitting: Family Medicine

## 2013-03-15 ENCOUNTER — Other Ambulatory Visit: Payer: Self-pay | Admitting: Family Medicine

## 2013-03-15 DIAGNOSIS — N6310 Unspecified lump in the right breast, unspecified quadrant: Secondary | ICD-10-CM

## 2013-03-29 DIAGNOSIS — Z23 Encounter for immunization: Secondary | ICD-10-CM | POA: Diagnosis not present

## 2013-04-03 DIAGNOSIS — I10 Essential (primary) hypertension: Secondary | ICD-10-CM | POA: Diagnosis not present

## 2013-04-03 DIAGNOSIS — Z23 Encounter for immunization: Secondary | ICD-10-CM | POA: Diagnosis not present

## 2013-05-01 DIAGNOSIS — I10 Essential (primary) hypertension: Secondary | ICD-10-CM | POA: Diagnosis not present

## 2013-08-10 ENCOUNTER — Ambulatory Visit
Admission: RE | Admit: 2013-08-10 | Discharge: 2013-08-10 | Disposition: A | Discharge status: Home / Self Care | Payer: Medicare Other | Source: Ambulatory Visit | Attending: Family Medicine | Admitting: Family Medicine

## 2013-08-10 DIAGNOSIS — N6489 Other specified disorders of breast: Secondary | ICD-10-CM | POA: Diagnosis not present

## 2013-08-10 DIAGNOSIS — N6310 Unspecified lump in the right breast, unspecified quadrant: Secondary | ICD-10-CM

## 2013-09-11 IMAGING — CR DG CHEST 2V
1 series · 1 of 1 positions shown · non-contrast
Comparison: None

CLINICAL DATA: Dizziness.  Near syncope.

CHEST - 2 VIEW

[w chest lat]
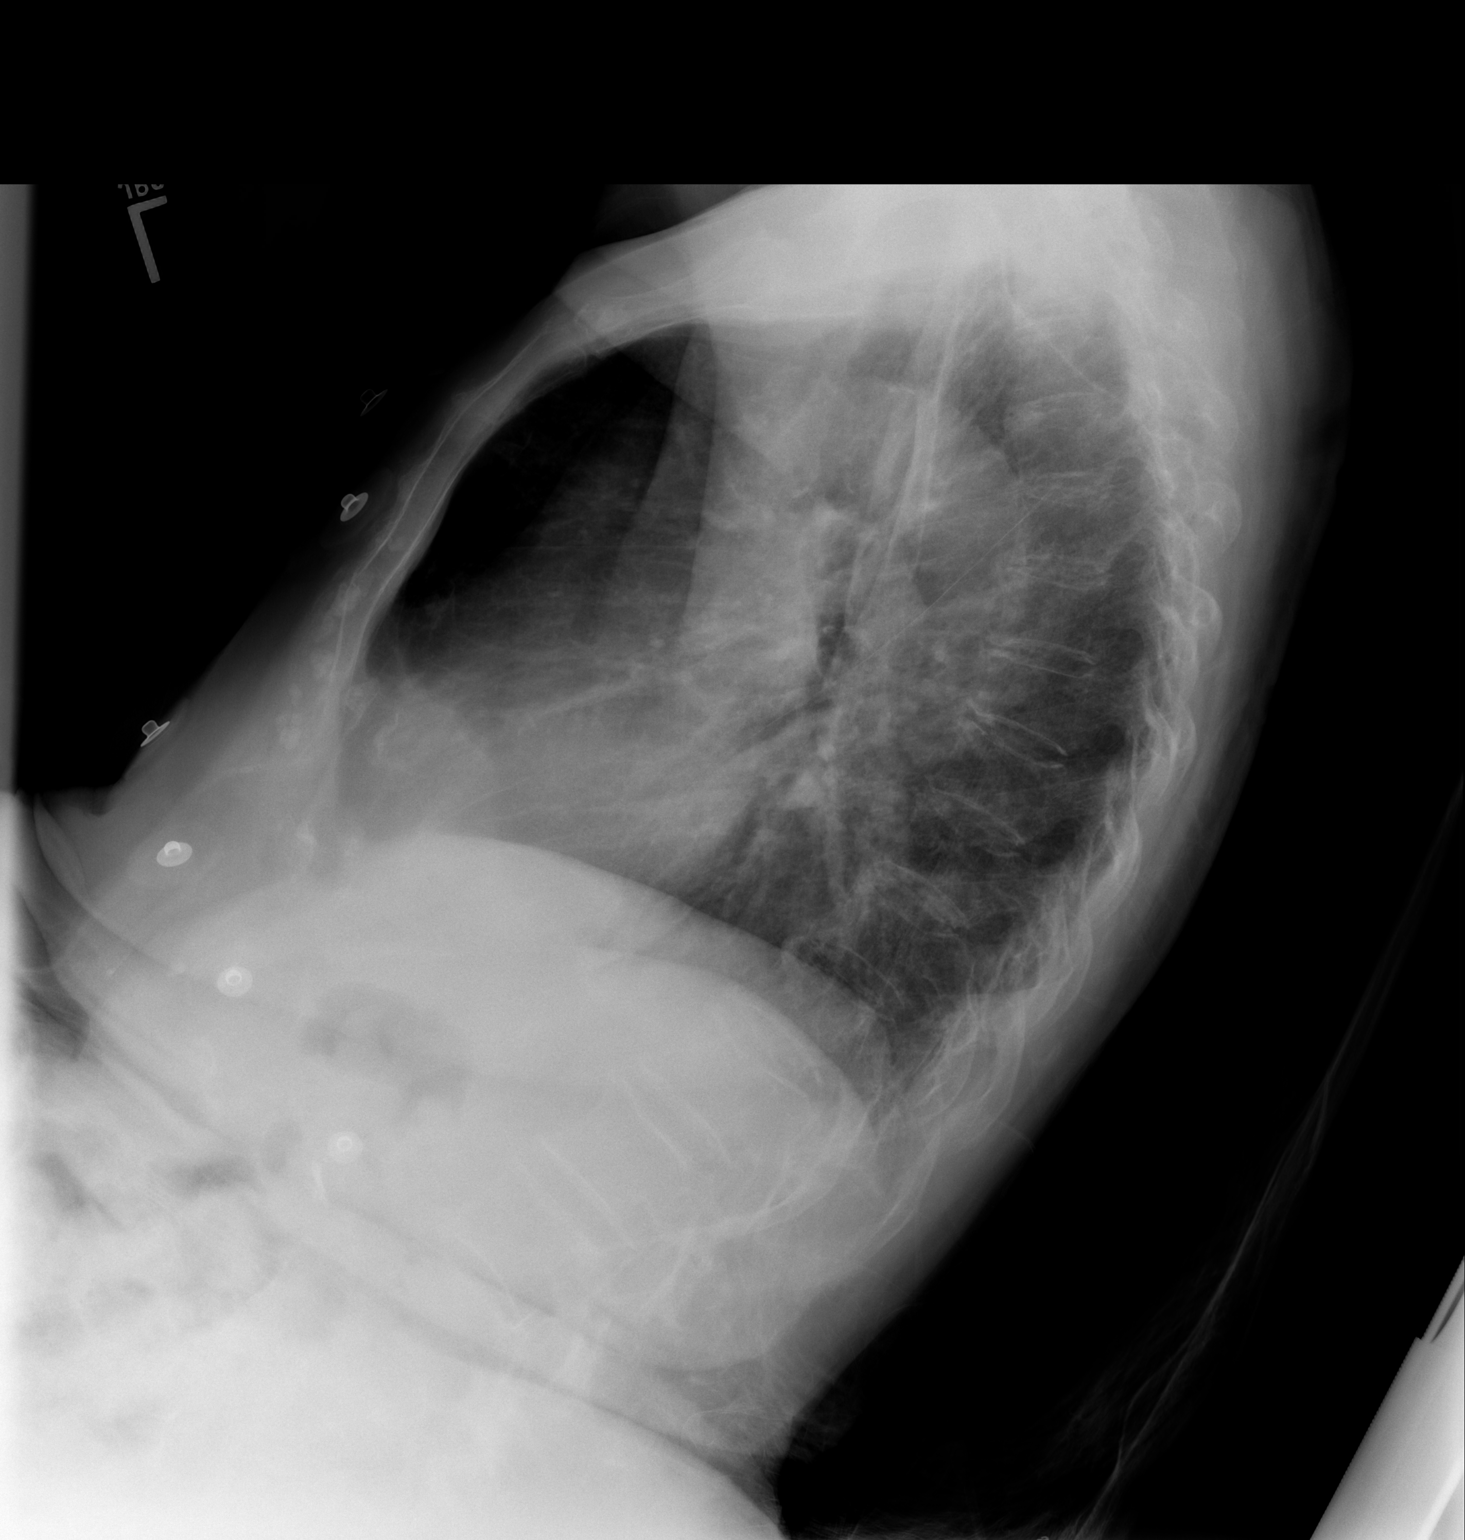

[1 of 1 positions shown; findings below may reference images not displayed]

FINDINGS: Mild hyperinflation of the lungs.  Biapical densities,
likely scarring.  Heart is upper limits normal in size.  No acute
opacities or effusions.  No acute bony abnormality.
IMPRESSION: COPD/chronic changes.  No active disease.

## 2013-09-11 IMAGING — CT CT HEAD W/O CM
1 of 2 series · 15 of 30 positions shown, 19 images · non-contrast
Comparison: None.

CLINICAL DATA: Near syncope and confusion

CT HEAD WITHOUT CONTRAST
TECHNIQUE: Contiguous axial images were obtained from the base of
the skull through the vertex without contrast. Study was obtained
within 24 hours of patient arrival at the emergency department.

[Series 3: recon 2: brain · axial · 0.47mm/px · z∈[+124,+257]mm · 15 of 56 slices shown, 19 images]
[im 3/56  brain]
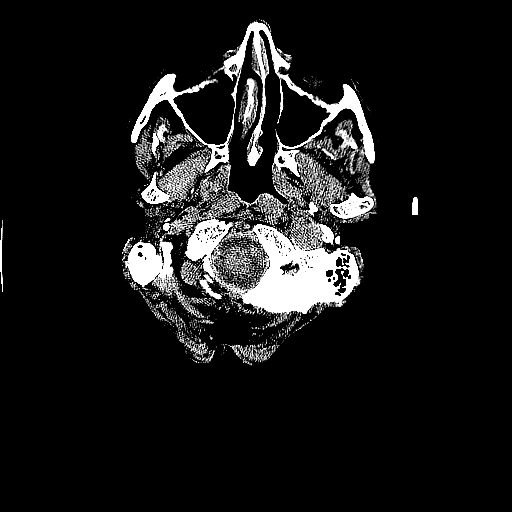
[im 3/56  bone]
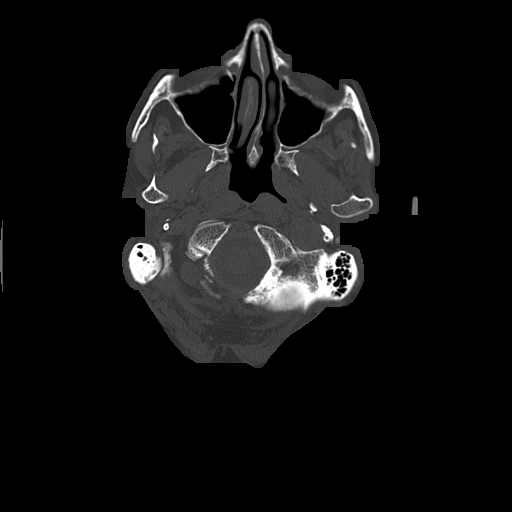
[im 6/56  brain]
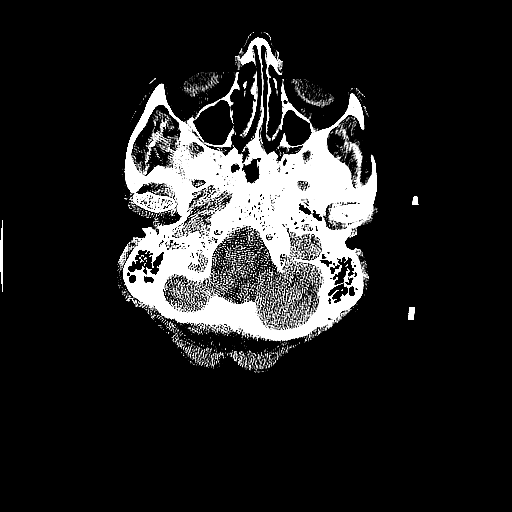
[im 12/56  brain]
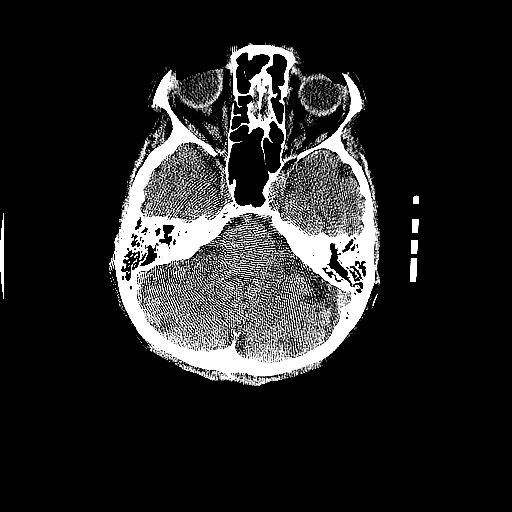
[im 15/56  brain]
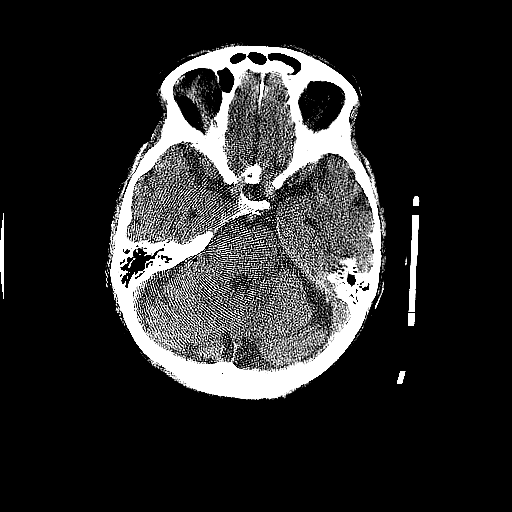
[im 18/56  brain]
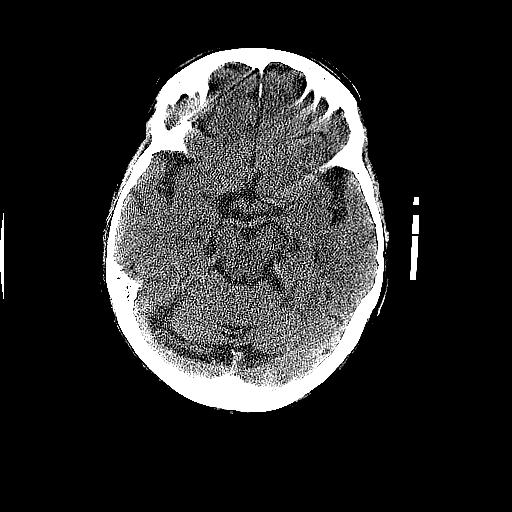
[im 18/56  bone]
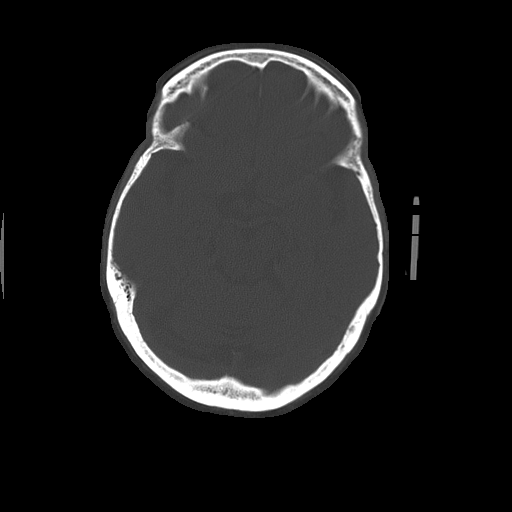
[im 21/56  brain]
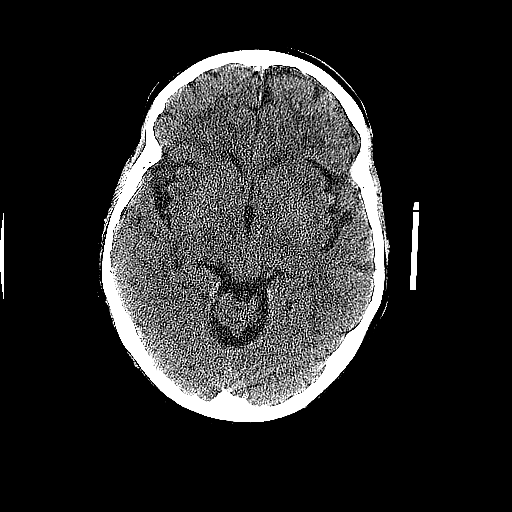
[im 24/56  brain]
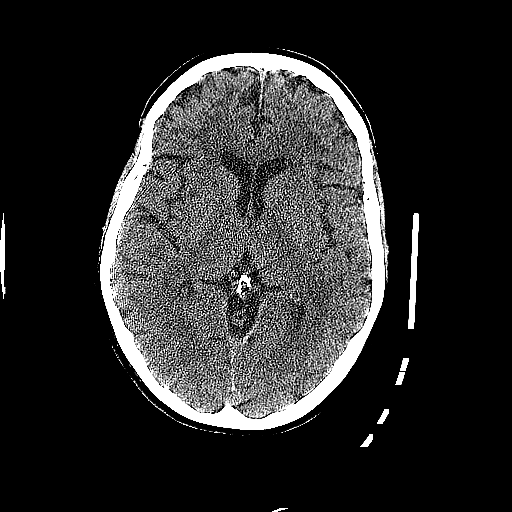
[im 29/56  brain]
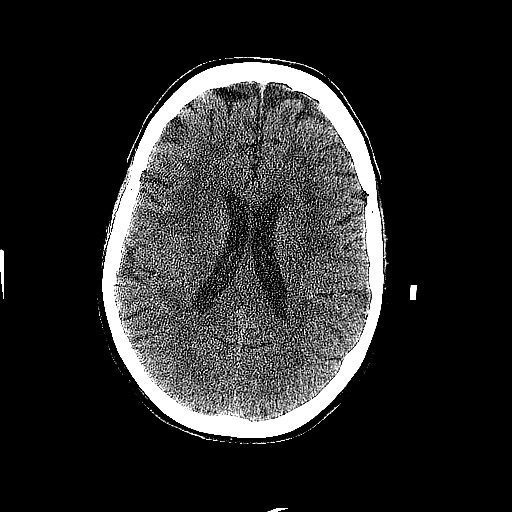
[im 32/56  brain]
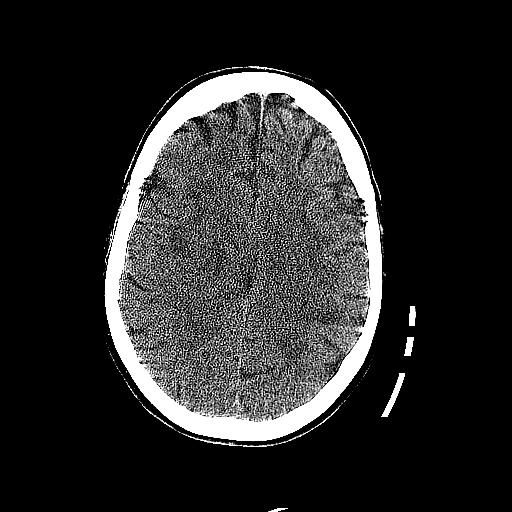
[im 32/56  bone]
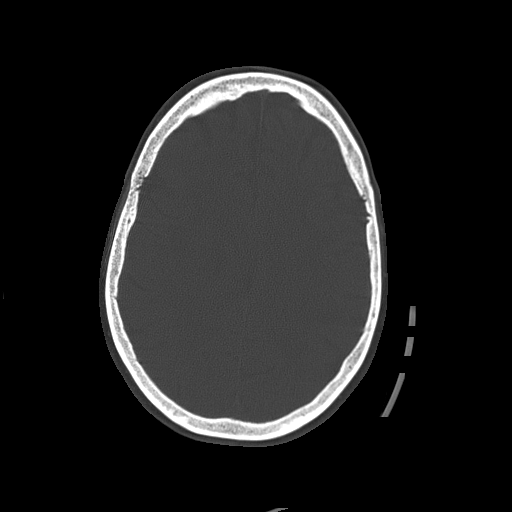
[im 35/56  brain]
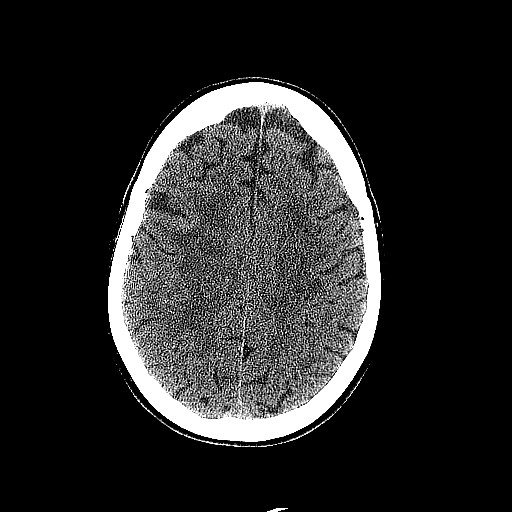
[im 38/56  brain]
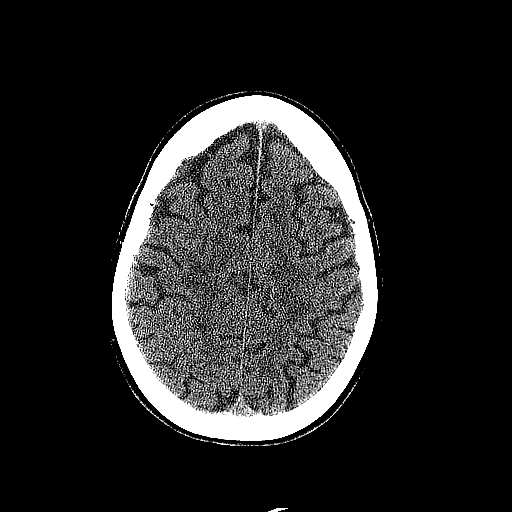
[im 41/56  brain]
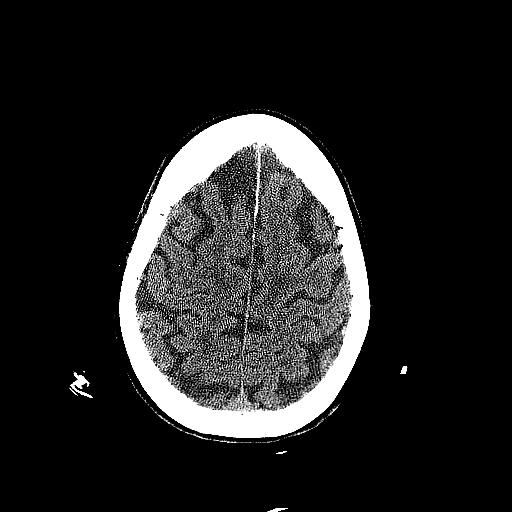
[im 47/56  brain]
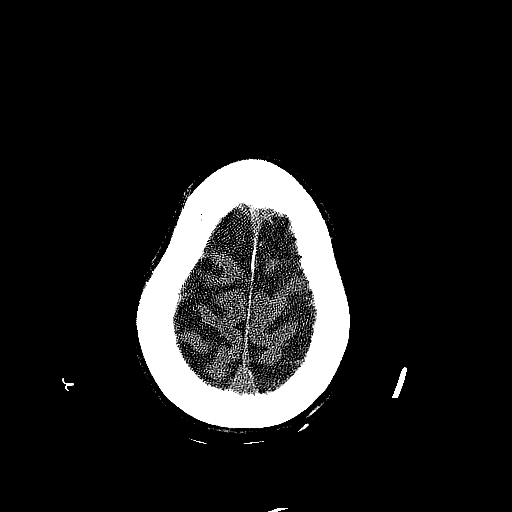
[im 47/56  bone]
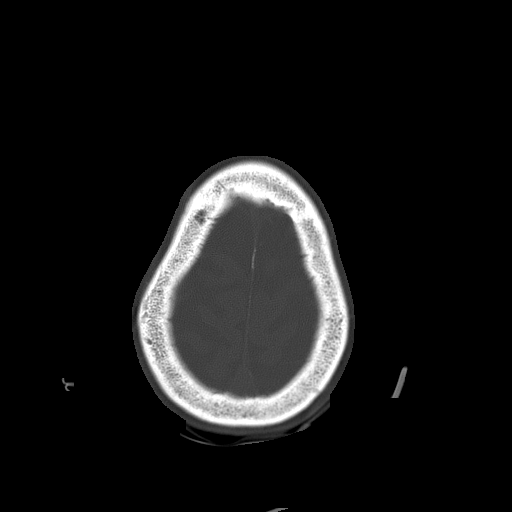
[im 50/56  brain]
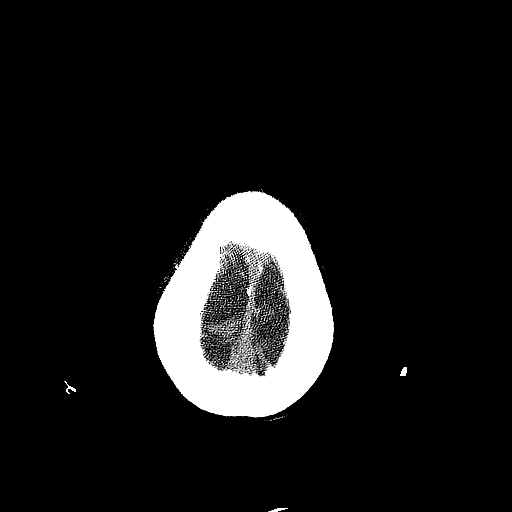
[im 53/56  brain]
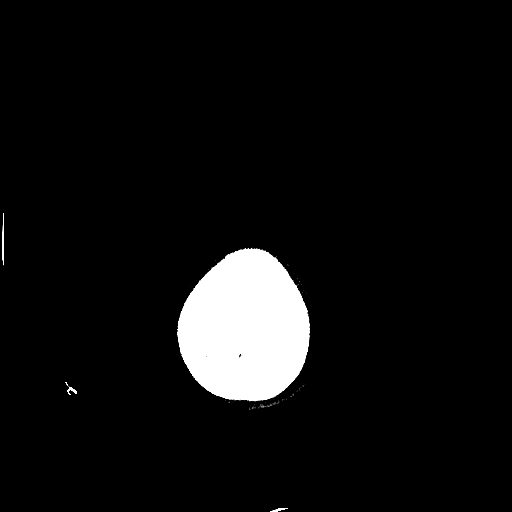

[15 of 30 positions shown; findings below may reference images not displayed]

FINDINGS: There is age-related volume loss.  There is no mass,
hemorrhage, extra-axial fluid collection, or midline shift.

There is patchy small vessel disease throughout the centra
semiovale bilaterally. There is decreased attenuation medial to the
sylvian fissure on the left which appears asymmetric compared the
right.  This finding may represent a small recent infarct.  No
other evidence suggesting recent infarct seen.

Bony calvarium appears intact.  Mastoid air cells are clear.
IMPRESSION: Moderate supratentorial small vessel disease. Question recent small
infarct on the left slightly medial to the sylvian fissure. No
other findings suspicious for recent infarct.  No hemorrhage or
mass effect.

## 2013-10-02 DIAGNOSIS — I10 Essential (primary) hypertension: Secondary | ICD-10-CM | POA: Diagnosis not present

## 2013-10-02 DIAGNOSIS — N63 Unspecified lump in unspecified breast: Secondary | ICD-10-CM | POA: Diagnosis not present

## 2013-10-02 DIAGNOSIS — F0390 Unspecified dementia without behavioral disturbance: Secondary | ICD-10-CM | POA: Diagnosis not present

## 2013-10-02 DIAGNOSIS — Z853 Personal history of malignant neoplasm of breast: Secondary | ICD-10-CM | POA: Diagnosis not present

## 2014-03-27 DIAGNOSIS — Z23 Encounter for immunization: Secondary | ICD-10-CM | POA: Diagnosis not present

## 2014-03-27 DIAGNOSIS — I10 Essential (primary) hypertension: Secondary | ICD-10-CM | POA: Diagnosis not present

## 2014-03-27 DIAGNOSIS — F0390 Unspecified dementia without behavioral disturbance: Secondary | ICD-10-CM | POA: Diagnosis not present

## 2014-03-27 DIAGNOSIS — Z853 Personal history of malignant neoplasm of breast: Secondary | ICD-10-CM | POA: Diagnosis not present

## 2014-03-27 DIAGNOSIS — N63 Unspecified lump in breast: Secondary | ICD-10-CM | POA: Diagnosis not present

## 2014-06-19 ENCOUNTER — Ambulatory Visit
Admission: RE | Admit: 2014-06-19 | Discharge: 2014-06-19 | Disposition: A | Discharge status: Home / Self Care | Payer: Medicare Other | Source: Ambulatory Visit | Attending: Family Medicine | Admitting: Family Medicine

## 2014-06-19 ENCOUNTER — Other Ambulatory Visit: Payer: Self-pay | Admitting: Family Medicine

## 2014-06-19 DIAGNOSIS — R062 Wheezing: Secondary | ICD-10-CM

## 2014-06-19 DIAGNOSIS — I10 Essential (primary) hypertension: Secondary | ICD-10-CM | POA: Diagnosis not present

## 2014-06-19 DIAGNOSIS — R05 Cough: Secondary | ICD-10-CM | POA: Diagnosis not present

## 2014-06-19 DIAGNOSIS — R0600 Dyspnea, unspecified: Secondary | ICD-10-CM | POA: Diagnosis not present

## 2014-06-19 DIAGNOSIS — R059 Cough, unspecified: Secondary | ICD-10-CM

## 2014-06-28 DIAGNOSIS — M79671 Pain in right foot: Secondary | ICD-10-CM | POA: Diagnosis not present

## 2014-06-28 DIAGNOSIS — I739 Peripheral vascular disease, unspecified: Secondary | ICD-10-CM | POA: Diagnosis not present

## 2014-06-28 DIAGNOSIS — M79672 Pain in left foot: Secondary | ICD-10-CM | POA: Diagnosis not present

## 2014-06-28 DIAGNOSIS — L603 Nail dystrophy: Secondary | ICD-10-CM | POA: Diagnosis not present

## 2014-09-20 ENCOUNTER — Other Ambulatory Visit: Payer: Self-pay

## 2014-09-20 DIAGNOSIS — Z1231 Encounter for screening mammogram for malignant neoplasm of breast: Secondary | ICD-10-CM

## 2014-09-27 DIAGNOSIS — J449 Chronic obstructive pulmonary disease, unspecified: Secondary | ICD-10-CM | POA: Diagnosis not present

## 2014-09-27 DIAGNOSIS — R0600 Dyspnea, unspecified: Secondary | ICD-10-CM | POA: Diagnosis not present

## 2014-09-27 DIAGNOSIS — I10 Essential (primary) hypertension: Secondary | ICD-10-CM | POA: Diagnosis not present

## 2014-10-05 ENCOUNTER — Ambulatory Visit
Admission: RE | Admit: 2014-10-05 | Discharge: 2014-10-05 | Disposition: A | Discharge status: Home / Self Care | Payer: Medicare Other | Source: Ambulatory Visit

## 2014-10-05 ENCOUNTER — Other Ambulatory Visit: Payer: Self-pay

## 2014-10-05 DIAGNOSIS — Z1231 Encounter for screening mammogram for malignant neoplasm of breast: Secondary | ICD-10-CM | POA: Diagnosis not present

## 2014-12-27 DIAGNOSIS — I10 Essential (primary) hypertension: Secondary | ICD-10-CM | POA: Diagnosis not present

## 2014-12-27 DIAGNOSIS — Z1389 Encounter for screening for other disorder: Secondary | ICD-10-CM | POA: Diagnosis not present

## 2014-12-27 DIAGNOSIS — R0600 Dyspnea, unspecified: Secondary | ICD-10-CM | POA: Diagnosis not present

## 2014-12-27 DIAGNOSIS — F0390 Unspecified dementia without behavioral disturbance: Secondary | ICD-10-CM | POA: Diagnosis not present

## 2014-12-27 DIAGNOSIS — J449 Chronic obstructive pulmonary disease, unspecified: Secondary | ICD-10-CM | POA: Diagnosis not present

## 2015-06-04 DIAGNOSIS — Z23 Encounter for immunization: Secondary | ICD-10-CM | POA: Diagnosis not present

## 2015-07-01 DIAGNOSIS — J449 Chronic obstructive pulmonary disease, unspecified: Secondary | ICD-10-CM | POA: Diagnosis not present

## 2015-07-01 DIAGNOSIS — I10 Essential (primary) hypertension: Secondary | ICD-10-CM | POA: Diagnosis not present

## 2015-07-01 DIAGNOSIS — F0390 Unspecified dementia without behavioral disturbance: Secondary | ICD-10-CM | POA: Diagnosis not present

## 2015-12-30 DIAGNOSIS — F0390 Unspecified dementia without behavioral disturbance: Secondary | ICD-10-CM | POA: Diagnosis not present

## 2015-12-30 DIAGNOSIS — Z853 Personal history of malignant neoplasm of breast: Secondary | ICD-10-CM | POA: Diagnosis not present

## 2015-12-30 DIAGNOSIS — I10 Essential (primary) hypertension: Secondary | ICD-10-CM | POA: Diagnosis not present

## 2015-12-30 DIAGNOSIS — J449 Chronic obstructive pulmonary disease, unspecified: Secondary | ICD-10-CM | POA: Diagnosis not present

## 2016-07-01 DIAGNOSIS — I10 Essential (primary) hypertension: Secondary | ICD-10-CM | POA: Diagnosis not present

## 2016-07-01 DIAGNOSIS — F0390 Unspecified dementia without behavioral disturbance: Secondary | ICD-10-CM | POA: Diagnosis not present

## 2016-07-01 DIAGNOSIS — Z23 Encounter for immunization: Secondary | ICD-10-CM | POA: Diagnosis not present

## 2016-11-13 DIAGNOSIS — I10 Essential (primary) hypertension: Secondary | ICD-10-CM | POA: Diagnosis not present

## 2016-11-13 DIAGNOSIS — F0390 Unspecified dementia without behavioral disturbance: Secondary | ICD-10-CM | POA: Diagnosis not present

## 2017-10-22 DIAGNOSIS — F0150 Vascular dementia without behavioral disturbance: Secondary | ICD-10-CM | POA: Diagnosis not present

## 2017-10-22 DIAGNOSIS — R2689 Other abnormalities of gait and mobility: Secondary | ICD-10-CM | POA: Diagnosis not present

## 2017-10-22 DIAGNOSIS — K5900 Constipation, unspecified: Secondary | ICD-10-CM | POA: Diagnosis not present

## 2017-10-22 DIAGNOSIS — I10 Essential (primary) hypertension: Secondary | ICD-10-CM | POA: Diagnosis not present

## 2018-04-25 DIAGNOSIS — Z23 Encounter for immunization: Secondary | ICD-10-CM | POA: Diagnosis not present

## 2018-04-25 DIAGNOSIS — H6120 Impacted cerumen, unspecified ear: Secondary | ICD-10-CM | POA: Diagnosis not present

## 2018-04-25 DIAGNOSIS — I129 Hypertensive chronic kidney disease with stage 1 through stage 4 chronic kidney disease, or unspecified chronic kidney disease: Secondary | ICD-10-CM | POA: Diagnosis not present

## 2018-04-25 DIAGNOSIS — N182 Chronic kidney disease, stage 2 (mild): Secondary | ICD-10-CM | POA: Diagnosis not present

## 2018-04-25 DIAGNOSIS — F0390 Unspecified dementia without behavioral disturbance: Secondary | ICD-10-CM | POA: Diagnosis not present

## 2018-04-25 DIAGNOSIS — E46 Unspecified protein-calorie malnutrition: Secondary | ICD-10-CM | POA: Diagnosis not present

## 2018-04-25 DIAGNOSIS — I10 Essential (primary) hypertension: Secondary | ICD-10-CM | POA: Diagnosis not present

## 2018-04-25 DIAGNOSIS — C4491 Basal cell carcinoma of skin, unspecified: Secondary | ICD-10-CM | POA: Diagnosis not present

## 2020-07-04 DIAGNOSIS — F0390 Unspecified dementia without behavioral disturbance: Secondary | ICD-10-CM | POA: Diagnosis not present

## 2020-07-04 DIAGNOSIS — I129 Hypertensive chronic kidney disease with stage 1 through stage 4 chronic kidney disease, or unspecified chronic kidney disease: Secondary | ICD-10-CM | POA: Diagnosis not present

## 2020-07-04 DIAGNOSIS — E46 Unspecified protein-calorie malnutrition: Secondary | ICD-10-CM | POA: Diagnosis not present

## 2020-07-04 DIAGNOSIS — N182 Chronic kidney disease, stage 2 (mild): Secondary | ICD-10-CM | POA: Diagnosis not present

## 2020-08-19 ENCOUNTER — Emergency Department (HOSPITAL_COMMUNITY): Payer: Medicare Other

## 2020-08-19 ENCOUNTER — Emergency Department (HOSPITAL_COMMUNITY)
Admission: EM | Admit: 2020-08-19 | Discharge: 2020-08-20 | Disposition: A | Discharge status: Home / Self Care | Payer: Medicare Other | Attending: Emergency Medicine | Admitting: Emergency Medicine

## 2020-08-19 ENCOUNTER — Other Ambulatory Visit: Payer: Self-pay

## 2020-08-19 DIAGNOSIS — R42 Dizziness and giddiness: Secondary | ICD-10-CM | POA: Diagnosis not present

## 2020-08-19 DIAGNOSIS — Z7982 Long term (current) use of aspirin: Secondary | ICD-10-CM | POA: Insufficient documentation

## 2020-08-19 DIAGNOSIS — W19XXXA Unspecified fall, initial encounter: Secondary | ICD-10-CM | POA: Diagnosis not present

## 2020-08-19 DIAGNOSIS — G309 Alzheimer's disease, unspecified: Secondary | ICD-10-CM | POA: Insufficient documentation

## 2020-08-19 DIAGNOSIS — Z20822 Contact with and (suspected) exposure to covid-19: Secondary | ICD-10-CM | POA: Diagnosis not present

## 2020-08-19 DIAGNOSIS — I6782 Cerebral ischemia: Secondary | ICD-10-CM | POA: Diagnosis not present

## 2020-08-19 DIAGNOSIS — F0280 Dementia in other diseases classified elsewhere without behavioral disturbance: Secondary | ICD-10-CM | POA: Diagnosis not present

## 2020-08-19 DIAGNOSIS — Z87891 Personal history of nicotine dependence: Secondary | ICD-10-CM | POA: Diagnosis not present

## 2020-08-19 DIAGNOSIS — J439 Emphysema, unspecified: Secondary | ICD-10-CM | POA: Diagnosis not present

## 2020-08-19 DIAGNOSIS — R531 Weakness: Secondary | ICD-10-CM | POA: Diagnosis not present

## 2020-08-19 DIAGNOSIS — R404 Transient alteration of awareness: Secondary | ICD-10-CM | POA: Diagnosis not present

## 2020-08-19 DIAGNOSIS — Z853 Personal history of malignant neoplasm of breast: Secondary | ICD-10-CM | POA: Diagnosis not present

## 2020-08-19 LAB — CBC WITH DIFFERENTIAL/PLATELET
Abs Immature Granulocytes: 0.03 10*3/uL (ref 0.00–0.07)
Basophils Absolute: 0.1 10*3/uL (ref 0.0–0.1)
Basophils Relative: 1 %
Eosinophils Absolute: 0.2 10*3/uL (ref 0.0–0.5)
Eosinophils Relative: 2 %
HCT: 44.4 % (ref 36.0–46.0)
Hemoglobin: 14 g/dL (ref 12.0–15.0)
Immature Granulocytes: 0 %
Lymphocytes Relative: 28 %
Lymphs Abs: 2.2 10*3/uL (ref 0.7–4.0)
MCH: 29.1 pg (ref 26.0–34.0)
MCHC: 31.5 g/dL (ref 30.0–36.0)
MCV: 92.3 fL (ref 80.0–100.0)
Monocytes Absolute: 0.8 10*3/uL (ref 0.1–1.0)
Monocytes Relative: 10 %
Neutro Abs: 4.6 10*3/uL (ref 1.7–7.7)
Neutrophils Relative %: 59 %
Platelets: 256 10*3/uL (ref 150–400)
RBC: 4.81 MIL/uL (ref 3.87–5.11)
RDW: 14.2 % (ref 11.5–15.5)
WBC: 7.9 10*3/uL (ref 4.0–10.5)
nRBC: 0 % (ref 0.0–0.2)

## 2020-08-19 LAB — COMPREHENSIVE METABOLIC PANEL
ALT: 11 U/L (ref 0–44)
AST: 21 U/L (ref 15–41)
Albumin: 3.9 g/dL (ref 3.5–5.0)
Alkaline Phosphatase: 66 U/L (ref 38–126)
Anion gap: 9 (ref 5–15)
BUN: 16 mg/dL (ref 8–23)
CO2: 25 mmol/L (ref 22–32)
Calcium: 9.4 mg/dL (ref 8.9–10.3)
Chloride: 104 mmol/L (ref 98–111)
Creatinine, Ser: 0.73 mg/dL (ref 0.44–1.00)
GFR, Estimated: >60 mL/min (ref >60)
Glucose, Bld: 107 mg/dL — ABNORMAL HIGH (ref 70–99)
Potassium: 4.2 mmol/L (ref 3.5–5.1)
Sodium: 138 mmol/L (ref 135–145)
Total Bilirubin: 0.8 mg/dL (ref 0.3–1.2)
Total Protein: 7 g/dL (ref 6.5–8.1)

## 2020-08-19 LAB — TROPONIN I (HIGH SENSITIVITY)
Troponin I (High Sensitivity): 9 ng/L (ref <18)
Troponin I (High Sensitivity): 10 ng/L (ref <18)

## 2020-08-19 LAB — URINALYSIS, ROUTINE W REFLEX MICROSCOPIC
Bilirubin Urine: NEGATIVE
Glucose, UA: NEGATIVE mg/dL
Hgb urine dipstick: NEGATIVE
Ketones, ur: NEGATIVE mg/dL
Leukocytes,Ua: NEGATIVE
Nitrite: NEGATIVE
Protein, ur: NEGATIVE mg/dL
Specific Gravity, Urine: 1.013 (ref 1.005–1.030)
pH: 5 (ref 5.0–8.0)

## 2020-08-19 LAB — PROTIME-INR
INR: 1 (ref 0.8–1.2)
Prothrombin Time: 12.4 seconds (ref 11.4–15.2)

## 2020-08-19 LAB — LACTIC ACID, PLASMA
Lactic Acid, Venous: 1.5 mmol/L (ref 0.5–1.9)
Lactic Acid, Venous: 0.8 mmol/L (ref 0.5–1.9)

## 2020-08-19 LAB — SARS CORONAVIRUS 2 BY RT PCR (HOSPITAL ORDER, PERFORMED IN ~~LOC~~ HOSPITAL LAB): SARS Coronavirus 2: NEGATIVE

## 2020-08-19 MED ORDER — SODIUM CHLORIDE 0.9 % IV BOLUS
500.0000 mL | Freq: Once | INTRAVENOUS | Status: AC
  Administered 2020-08-19: 500 mL via INTRAVENOUS

## 2020-08-19 MED ORDER — LORAZEPAM 2 MG/ML IJ SOLN
1.0000 mg | Freq: Once | INTRAMUSCULAR | Status: AC
  Administered 2020-08-19: 1 mg via INTRAVENOUS
  Filled 2020-08-19: qty 1
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
## 2020-08-19 NOTE — ED Notes (Signed)
Dr. Francia Greaves at bedside for ambulation trial

## 2020-08-19 NOTE — ED Provider Notes (Signed)
[facility] EMERGENCY DEPARTMENT Provider Note   CSN: [id number] Arrival date & time: 08/19/20  1520     History Chief Complaint  Patient presents with  . Dizziness    Laura Colon is a 87 y.o. female.  87 year old female with prior medical history as detailed below presents for evaluation.  Patient is accompanied by her daughter who provides majority of history.  Patient with advanced Alzheimer's.  Patient's daughter reports that she did have a fall approximately 2 to 3 weeks ago.  There may have been a head injury at that time.  Over the weekend the daughter reports that the patient seemed to be unsteady on her feet.  Patient is currently alert and agitated.    Patient's daughter does not report significant change in mental status.  She reports that agitation and new surroundings is typical for her mother.  Patient's daughter contacted the PCP and was advised to come to the ED for evaluation.    The history is provided by the patient, the EMS personnel and medical records.  Dizziness Quality:  Unable to specify Severity:  Unable to specify Onset quality:  Gradual Duration:  2 days Timing:  Unable to specify Progression:  Unable to specify Relieved by:  Nothing Worsened by:  Nothing      Past Medical History:  Diagnosis Date  . Alzheimer's dementia ([place])    "dx'd 03/11/2012" (03/17/2012)  . Arthritis    "hands occasionally" (03/17/2012)  . Breast cancer, left ([place])   . Mass of breast, right    "to have bx 03/21/2012" (03/17/2012)  . Pneumonia    "twice in the 1960's when she was smoking" (03/17/2012)  . Short-term memory loss   . Syncope and collapse 03/17/2012   no loss of consciousness     Patient Active Problem List   Diagnosis Date Noted  . Malnutrition ([place]) 03/21/2012  . Inadequate oral intake 03/21/2012  . Constipation 03/20/2012  . Abdominal pain 03/19/2012  . Hypotension 03/18/2012  . Hypokalemia 03/18/2012  . Weakness  generalized 03/18/2012  . Breast mass, right 03/18/2012  . Near syncope 03/17/2012  . Dementia [facility])     Past Surgical History:  Procedure Laterality Date  . APPENDECTOMY     "as a child"  . CHOLECYSTECTOMY  2000's  . MASTECTOMY  1960's   left     OB History   No obstetric history on file.     No family history on file.  Social History   Tobacco Use  . Smoking status: Former Smoker    Packs/day: 0.50    Years: 25.00    Pack years: 12.50    Types: Cigarettes  . Smokeless tobacco: Never Used  Substance Use Topics  . Alcohol use: Yes    Comment: 03/17/2012 "drinks wine not all that much; maybe once/yr"  . Drug use: No    Home Medications Prior to Admission medications   Medication Sig Start Date End Date Taking? Authorizing Provider  aspirin EC 81 MG EC tablet Take 1 tablet (81 mg total) by mouth daily. 03/24/12   Black, Lezlie Octave, NP  calcium-vitamin D (OSCAL WITH D) 500-200 MG-UNIT per tablet Take 1 tablet by mouth daily.    [provider]  docusate sodium 100 MG CAPS Take 100 mg by mouth 2 (two) times daily. 03/24/12   Black, Lezlie Octave, NP  feeding supplement (ENSURE COMPLETE) LIQD Take 237 mLs by mouth 2 (two) times daily between meals. 03/24/12   Black, [name]  M, NP  ibuprofen (ADVIL,MOTRIN) 400 MG tablet Take 1 tablet (400 mg total) by mouth every 6 (six) hours as needed. 03/24/12   Black, Lezlie Octave, NP  Multiple Vitamin (MULTIVITAMIN WITH MINERALS) TABS Take 1 tablet by mouth daily.    [provider]  simethicone (MYLICON) 80 MG chewable tablet Chew 1 tablet (80 mg total) by mouth every 6 (six) hours as needed for flatulence. 03/24/12   Black, Lezlie Octave, NP    Allergies    Patient has no known allergies.  Review of Systems   Review of Systems  Unable to perform ROS: Dementia  Neurological: Positive for dizziness.    Physical Exam Updated Vital Signs BP (!) 115/98   Pulse 70   Temp 97.6 F (36.4 C) (Oral)   Resp 16   SpO2 98%   Physical  Exam Vitals and nursing note reviewed.  Constitutional:      General: She is not in acute distress.    Appearance: Normal appearance. She is well-developed.  HENT:     Head: Normocephalic and atraumatic.  Eyes:     Conjunctiva/sclera: Conjunctivae normal.     Pupils: Pupils are equal, round, and reactive to light.  Cardiovascular:     Rate and Rhythm: Normal rate and regular rhythm.     Heart sounds: Normal heart sounds.  Pulmonary:     Effort: Pulmonary effort is normal. No respiratory distress.     Breath sounds: Normal breath sounds.  Abdominal:     General: There is no distension.     Palpations: Abdomen is soft.     Tenderness: There is no abdominal tenderness.  Musculoskeletal:        General: No deformity. Normal range of motion.     Cervical back: Normal range of motion and neck supple.  Skin:    General: Skin is warm and dry.  Neurological:     General: No focal deficit present.     Mental Status: She is alert. Mental status is at baseline.     ED Results / Procedures / Treatments   Labs (all labs ordered are listed, but only abnormal results are displayed) Labs Reviewed  COMPREHENSIVE METABOLIC PANEL - Abnormal; Notable for the following components:      Result Value   Glucose, Bld 107 (*)    All other components within normal limits  SARS CORONAVIRUS 2 BY RT PCR (HOSPITAL ORDER, [facility])  LACTIC ACID, PLASMA  LACTIC ACID, PLASMA  CBC WITH DIFFERENTIAL/PLATELET  PROTIME-INR  URINALYSIS, ROUTINE W REFLEX MICROSCOPIC  TROPONIN I (HIGH SENSITIVITY)  TROPONIN I (HIGH SENSITIVITY)    EKG None  Radiology CT Head Wo Contrast  Result Date: 08/19/2020 CLINICAL DATA:  Dizziness. EXAM: CT HEAD WITHOUT CONTRAST TECHNIQUE: Contiguous axial images were obtained from the base of the skull through the vertex without intravenous contrast. COMPARISON:  March 17, 2012. FINDINGS: Brain: Moderate diffuse chronic ischemic white matter disease  is noted. Mild diffuse cortical atrophy is noted. No mass effect or midline shift is noted. Ventricular size is within normal limits. There is no evidence of mass lesion, hemorrhage or acute infarction. Vascular: No hyperdense vessel or unexpected calcification. Skull: Normal. Negative for fracture or focal lesion. Sinuses/Orbits: No acute finding. Other: None. IMPRESSION: Moderate diffuse chronic ischemic white matter disease. Mild diffuse cortical atrophy. No acute intracranial abnormality seen. Electronically Signed   By: Marijo Conception M.D.   On: 08/19/2020 16:39   DG Chest [facility]  Result Date: 08/19/2020 CLINICAL DATA:  Weakness. EXAM: PORTABLE CHEST 1 VIEW COMPARISON:  06/19/2014 FINDINGS: The cardiac silhouette, mediastinal and hilar contours are within normal limits. Stable tortuosity and calcification of the thoracic aorta. Hyperinflation and mild emphysematous changes along with mild chronic bronchitic changes. No focal pulmonary infiltrates or pleural effusions. No worrisome pulmonary lesions. The bony thorax is intact. IMPRESSION: Chronic lung changes but no acute pulmonary findings. Electronically Signed   By: Marijo Sanes M.D.   On: 08/19/2020 16:25    Procedures Procedures   Medications Ordered in ED Medications  LORazepam (ATIVAN) injection 1 mg (1 mg Intravenous Given 08/19/20 1559)  sodium chloride 0.9 % bolus 500 mL (0 mLs Intravenous Stopped 08/19/20 2042)    ED Course  I have reviewed the triage vital signs and the nursing notes.  Pertinent labs & imaging results that were available during my care of the patient were reviewed by me and considered in my medical decision making (see chart for details).    MDM Rules/Calculators/A&P                          MDM  Screen complete  ETHLYN ALTO was evaluated in Emergency Department on 08/19/2020 for the symptoms described in the history of present illness. She was evaluated in the context of the global COVID-19  pandemic, which necessitated consideration that the patient might be at risk for infection with the SARS-CoV-2 virus that causes COVID-19. Institutional protocols and algorithms that pertain to the evaluation of patients at risk for COVID-19 are in a state of rapid change based on information released by regulatory bodies including the CDC and federal and state organizations. These policies and algorithms were followed during the patient's care in the ED.  Patient is presenting for evaluation of reported unsteadiness.  Patient is agitated with advanced Alzheimer's.  Patient without focal findings on neuro exam.  Patient required Ativan in order to place IV and obtain initial labs.  CT Brain imaging did not reveal significant acute process.  Other baseline screening labs are without significant abnormality.  UA is still pending.  In and out cath was unsuccessful.  Patient was finally able to produce urine collected with pure wick at 2220. Staff aware.   Patient's daughter would prefer that the patient be discharged home.  She does want to have her ambulation observed prior to discharge.  The patient is not vaccinated for Covid and the daughter is concerned that she might catch it while in the hospital.  The possibility of a CVA was discussed with the patient's daughter.  MRI was discussed as the definitive test to look for same.  Patient's agitation would require significant sedation in order to obtain useful MRI.  Patient's daughter would prefer to not attempt MRI at this time.   Ambulation trial (with a walker) showed that the patient was stable on her feet.  Patient daughter reports that she does have a walker at home if required.  UA normal.   Patient's daughter elects to take the patient home.  Daughter does understand the need to contact her regular care provider for home health assessment.    Final Clinical Impression(s) / ED Diagnoses Final diagnoses:  Alzheimer's dementia without  behavioral disturbance, unspecified timing of dementia onset [facility])    Rx / DC Orders ED Discharge Orders    None       Valarie Merino, MD 08/19/20 2333

## 2020-08-19 NOTE — ED Notes (Signed)
This RN and [place] in and out pt with no urine return. MD aware.

## 2020-08-19 NOTE — Discharge Instructions (Addendum)
Please return for any problem.  °

## 2020-08-20 NOTE — ED Notes (Signed)
Patient verbalizes understanding of discharge instructions. Opportunity for questioning and answers were provided. Armband removed by staff, pt discharged from ED via wheelchair.  

## 2020-09-08 DIAGNOSIS — F0150 Vascular dementia without behavioral disturbance: Secondary | ICD-10-CM | POA: Diagnosis not present

## 2020-09-08 DIAGNOSIS — F0280 Dementia in other diseases classified elsewhere without behavioral disturbance: Secondary | ICD-10-CM | POA: Diagnosis not present

## 2020-09-08 DIAGNOSIS — E46 Unspecified protein-calorie malnutrition: Secondary | ICD-10-CM | POA: Diagnosis not present

## 2020-09-08 DIAGNOSIS — R634 Abnormal weight loss: Secondary | ICD-10-CM | POA: Diagnosis not present

## 2020-09-08 DIAGNOSIS — Z853 Personal history of malignant neoplasm of breast: Secondary | ICD-10-CM | POA: Diagnosis not present

## 2020-09-08 DIAGNOSIS — Z681 Body mass index (BMI) 19 or less, adult: Secondary | ICD-10-CM | POA: Diagnosis not present

## 2020-09-08 DIAGNOSIS — Z9181 History of falling: Secondary | ICD-10-CM | POA: Diagnosis not present

## 2020-09-08 DIAGNOSIS — G309 Alzheimer's disease, unspecified: Secondary | ICD-10-CM | POA: Diagnosis not present

## 2020-09-08 DIAGNOSIS — J449 Chronic obstructive pulmonary disease, unspecified: Secondary | ICD-10-CM | POA: Diagnosis not present

## 2020-09-08 DIAGNOSIS — Z87891 Personal history of nicotine dependence: Secondary | ICD-10-CM | POA: Diagnosis not present

## 2020-09-08 DIAGNOSIS — N182 Chronic kidney disease, stage 2 (mild): Secondary | ICD-10-CM | POA: Diagnosis not present

## 2020-09-08 DIAGNOSIS — Z85828 Personal history of other malignant neoplasm of skin: Secondary | ICD-10-CM | POA: Diagnosis not present

## 2020-09-08 DIAGNOSIS — I129 Hypertensive chronic kidney disease with stage 1 through stage 4 chronic kidney disease, or unspecified chronic kidney disease: Secondary | ICD-10-CM | POA: Diagnosis not present

## 2020-09-09 DIAGNOSIS — F0280 Dementia in other diseases classified elsewhere without behavioral disturbance: Secondary | ICD-10-CM | POA: Diagnosis not present

## 2020-09-09 DIAGNOSIS — I129 Hypertensive chronic kidney disease with stage 1 through stage 4 chronic kidney disease, or unspecified chronic kidney disease: Secondary | ICD-10-CM | POA: Diagnosis not present

## 2020-09-09 DIAGNOSIS — N182 Chronic kidney disease, stage 2 (mild): Secondary | ICD-10-CM | POA: Diagnosis not present

## 2020-09-09 DIAGNOSIS — E46 Unspecified protein-calorie malnutrition: Secondary | ICD-10-CM | POA: Diagnosis not present

## 2020-09-09 DIAGNOSIS — G309 Alzheimer's disease, unspecified: Secondary | ICD-10-CM | POA: Diagnosis not present

## 2020-09-09 DIAGNOSIS — F0150 Vascular dementia without behavioral disturbance: Secondary | ICD-10-CM | POA: Diagnosis not present

## 2020-09-12 DIAGNOSIS — F0280 Dementia in other diseases classified elsewhere without behavioral disturbance: Secondary | ICD-10-CM | POA: Diagnosis not present

## 2020-09-12 DIAGNOSIS — I129 Hypertensive chronic kidney disease with stage 1 through stage 4 chronic kidney disease, or unspecified chronic kidney disease: Secondary | ICD-10-CM | POA: Diagnosis not present

## 2020-09-12 DIAGNOSIS — E46 Unspecified protein-calorie malnutrition: Secondary | ICD-10-CM | POA: Diagnosis not present

## 2020-09-12 DIAGNOSIS — F0150 Vascular dementia without behavioral disturbance: Secondary | ICD-10-CM | POA: Diagnosis not present

## 2020-09-12 DIAGNOSIS — G309 Alzheimer's disease, unspecified: Secondary | ICD-10-CM | POA: Diagnosis not present

## 2020-09-12 DIAGNOSIS — N182 Chronic kidney disease, stage 2 (mild): Secondary | ICD-10-CM | POA: Diagnosis not present

## 2020-09-13 DIAGNOSIS — F0150 Vascular dementia without behavioral disturbance: Secondary | ICD-10-CM | POA: Diagnosis not present

## 2020-09-13 DIAGNOSIS — E46 Unspecified protein-calorie malnutrition: Secondary | ICD-10-CM | POA: Diagnosis not present

## 2020-09-13 DIAGNOSIS — N182 Chronic kidney disease, stage 2 (mild): Secondary | ICD-10-CM | POA: Diagnosis not present

## 2020-09-13 DIAGNOSIS — F0280 Dementia in other diseases classified elsewhere without behavioral disturbance: Secondary | ICD-10-CM | POA: Diagnosis not present

## 2020-09-13 DIAGNOSIS — G309 Alzheimer's disease, unspecified: Secondary | ICD-10-CM | POA: Diagnosis not present

## 2020-09-13 DIAGNOSIS — I129 Hypertensive chronic kidney disease with stage 1 through stage 4 chronic kidney disease, or unspecified chronic kidney disease: Secondary | ICD-10-CM | POA: Diagnosis not present

## 2020-09-16 DIAGNOSIS — G309 Alzheimer's disease, unspecified: Secondary | ICD-10-CM | POA: Diagnosis not present

## 2020-09-16 DIAGNOSIS — N182 Chronic kidney disease, stage 2 (mild): Secondary | ICD-10-CM | POA: Diagnosis not present

## 2020-09-16 DIAGNOSIS — F0280 Dementia in other diseases classified elsewhere without behavioral disturbance: Secondary | ICD-10-CM | POA: Diagnosis not present

## 2020-09-16 DIAGNOSIS — F0150 Vascular dementia without behavioral disturbance: Secondary | ICD-10-CM | POA: Diagnosis not present

## 2020-09-16 DIAGNOSIS — I129 Hypertensive chronic kidney disease with stage 1 through stage 4 chronic kidney disease, or unspecified chronic kidney disease: Secondary | ICD-10-CM | POA: Diagnosis not present

## 2020-09-16 DIAGNOSIS — E46 Unspecified protein-calorie malnutrition: Secondary | ICD-10-CM | POA: Diagnosis not present

## 2020-09-17 DIAGNOSIS — I129 Hypertensive chronic kidney disease with stage 1 through stage 4 chronic kidney disease, or unspecified chronic kidney disease: Secondary | ICD-10-CM | POA: Diagnosis not present

## 2020-09-17 DIAGNOSIS — F0150 Vascular dementia without behavioral disturbance: Secondary | ICD-10-CM | POA: Diagnosis not present

## 2020-09-17 DIAGNOSIS — E46 Unspecified protein-calorie malnutrition: Secondary | ICD-10-CM | POA: Diagnosis not present

## 2020-09-17 DIAGNOSIS — F0280 Dementia in other diseases classified elsewhere without behavioral disturbance: Secondary | ICD-10-CM | POA: Diagnosis not present

## 2020-09-17 DIAGNOSIS — N182 Chronic kidney disease, stage 2 (mild): Secondary | ICD-10-CM | POA: Diagnosis not present

## 2020-09-17 DIAGNOSIS — G309 Alzheimer's disease, unspecified: Secondary | ICD-10-CM | POA: Diagnosis not present

## 2020-09-18 DIAGNOSIS — N182 Chronic kidney disease, stage 2 (mild): Secondary | ICD-10-CM | POA: Diagnosis not present

## 2020-09-18 DIAGNOSIS — G309 Alzheimer's disease, unspecified: Secondary | ICD-10-CM | POA: Diagnosis not present

## 2020-09-18 DIAGNOSIS — F0280 Dementia in other diseases classified elsewhere without behavioral disturbance: Secondary | ICD-10-CM | POA: Diagnosis not present

## 2020-09-18 DIAGNOSIS — I129 Hypertensive chronic kidney disease with stage 1 through stage 4 chronic kidney disease, or unspecified chronic kidney disease: Secondary | ICD-10-CM | POA: Diagnosis not present

## 2020-09-18 DIAGNOSIS — F0150 Vascular dementia without behavioral disturbance: Secondary | ICD-10-CM | POA: Diagnosis not present

## 2020-09-18 DIAGNOSIS — E46 Unspecified protein-calorie malnutrition: Secondary | ICD-10-CM | POA: Diagnosis not present

## 2020-09-19 DIAGNOSIS — F0150 Vascular dementia without behavioral disturbance: Secondary | ICD-10-CM | POA: Diagnosis not present

## 2020-09-19 DIAGNOSIS — N182 Chronic kidney disease, stage 2 (mild): Secondary | ICD-10-CM | POA: Diagnosis not present

## 2020-09-19 DIAGNOSIS — I129 Hypertensive chronic kidney disease with stage 1 through stage 4 chronic kidney disease, or unspecified chronic kidney disease: Secondary | ICD-10-CM | POA: Diagnosis not present

## 2020-09-19 DIAGNOSIS — E46 Unspecified protein-calorie malnutrition: Secondary | ICD-10-CM | POA: Diagnosis not present

## 2020-09-19 DIAGNOSIS — G309 Alzheimer's disease, unspecified: Secondary | ICD-10-CM | POA: Diagnosis not present

## 2020-09-19 DIAGNOSIS — F0280 Dementia in other diseases classified elsewhere without behavioral disturbance: Secondary | ICD-10-CM | POA: Diagnosis not present

## 2020-09-23 DIAGNOSIS — E46 Unspecified protein-calorie malnutrition: Secondary | ICD-10-CM | POA: Diagnosis not present

## 2020-09-23 DIAGNOSIS — N182 Chronic kidney disease, stage 2 (mild): Secondary | ICD-10-CM | POA: Diagnosis not present

## 2020-09-23 DIAGNOSIS — F0280 Dementia in other diseases classified elsewhere without behavioral disturbance: Secondary | ICD-10-CM | POA: Diagnosis not present

## 2020-09-23 DIAGNOSIS — I129 Hypertensive chronic kidney disease with stage 1 through stage 4 chronic kidney disease, or unspecified chronic kidney disease: Secondary | ICD-10-CM | POA: Diagnosis not present

## 2020-09-23 DIAGNOSIS — G309 Alzheimer's disease, unspecified: Secondary | ICD-10-CM | POA: Diagnosis not present

## 2020-09-23 DIAGNOSIS — F0150 Vascular dementia without behavioral disturbance: Secondary | ICD-10-CM | POA: Diagnosis not present

## 2020-09-24 DIAGNOSIS — F0150 Vascular dementia without behavioral disturbance: Secondary | ICD-10-CM | POA: Diagnosis not present

## 2020-09-24 DIAGNOSIS — E46 Unspecified protein-calorie malnutrition: Secondary | ICD-10-CM | POA: Diagnosis not present

## 2020-09-24 DIAGNOSIS — N182 Chronic kidney disease, stage 2 (mild): Secondary | ICD-10-CM | POA: Diagnosis not present

## 2020-09-24 DIAGNOSIS — I129 Hypertensive chronic kidney disease with stage 1 through stage 4 chronic kidney disease, or unspecified chronic kidney disease: Secondary | ICD-10-CM | POA: Diagnosis not present

## 2020-09-24 DIAGNOSIS — F0280 Dementia in other diseases classified elsewhere without behavioral disturbance: Secondary | ICD-10-CM | POA: Diagnosis not present

## 2020-09-24 DIAGNOSIS — G309 Alzheimer's disease, unspecified: Secondary | ICD-10-CM | POA: Diagnosis not present

## 2020-09-26 DIAGNOSIS — N182 Chronic kidney disease, stage 2 (mild): Secondary | ICD-10-CM | POA: Diagnosis not present

## 2020-09-26 DIAGNOSIS — I129 Hypertensive chronic kidney disease with stage 1 through stage 4 chronic kidney disease, or unspecified chronic kidney disease: Secondary | ICD-10-CM | POA: Diagnosis not present

## 2020-09-26 DIAGNOSIS — F0280 Dementia in other diseases classified elsewhere without behavioral disturbance: Secondary | ICD-10-CM | POA: Diagnosis not present

## 2020-09-26 DIAGNOSIS — E46 Unspecified protein-calorie malnutrition: Secondary | ICD-10-CM | POA: Diagnosis not present

## 2020-09-26 DIAGNOSIS — G309 Alzheimer's disease, unspecified: Secondary | ICD-10-CM | POA: Diagnosis not present

## 2020-09-26 DIAGNOSIS — F0150 Vascular dementia without behavioral disturbance: Secondary | ICD-10-CM | POA: Diagnosis not present

## 2020-09-27 DIAGNOSIS — G309 Alzheimer's disease, unspecified: Secondary | ICD-10-CM | POA: Diagnosis not present

## 2020-09-27 DIAGNOSIS — N182 Chronic kidney disease, stage 2 (mild): Secondary | ICD-10-CM | POA: Diagnosis not present

## 2020-09-27 DIAGNOSIS — F0280 Dementia in other diseases classified elsewhere without behavioral disturbance: Secondary | ICD-10-CM | POA: Diagnosis not present

## 2020-09-27 DIAGNOSIS — F0150 Vascular dementia without behavioral disturbance: Secondary | ICD-10-CM | POA: Diagnosis not present

## 2020-09-27 DIAGNOSIS — E46 Unspecified protein-calorie malnutrition: Secondary | ICD-10-CM | POA: Diagnosis not present

## 2020-09-27 DIAGNOSIS — I129 Hypertensive chronic kidney disease with stage 1 through stage 4 chronic kidney disease, or unspecified chronic kidney disease: Secondary | ICD-10-CM | POA: Diagnosis not present

## 2020-09-30 DIAGNOSIS — N182 Chronic kidney disease, stage 2 (mild): Secondary | ICD-10-CM | POA: Diagnosis not present

## 2020-09-30 DIAGNOSIS — F0280 Dementia in other diseases classified elsewhere without behavioral disturbance: Secondary | ICD-10-CM | POA: Diagnosis not present

## 2020-09-30 DIAGNOSIS — G309 Alzheimer's disease, unspecified: Secondary | ICD-10-CM | POA: Diagnosis not present

## 2020-09-30 DIAGNOSIS — E46 Unspecified protein-calorie malnutrition: Secondary | ICD-10-CM | POA: Diagnosis not present

## 2020-09-30 DIAGNOSIS — F0150 Vascular dementia without behavioral disturbance: Secondary | ICD-10-CM | POA: Diagnosis not present

## 2020-09-30 DIAGNOSIS — I129 Hypertensive chronic kidney disease with stage 1 through stage 4 chronic kidney disease, or unspecified chronic kidney disease: Secondary | ICD-10-CM | POA: Diagnosis not present

## 2020-10-02 DIAGNOSIS — N182 Chronic kidney disease, stage 2 (mild): Secondary | ICD-10-CM | POA: Diagnosis not present

## 2020-10-02 DIAGNOSIS — G309 Alzheimer's disease, unspecified: Secondary | ICD-10-CM | POA: Diagnosis not present

## 2020-10-02 DIAGNOSIS — F0150 Vascular dementia without behavioral disturbance: Secondary | ICD-10-CM | POA: Diagnosis not present

## 2020-10-02 DIAGNOSIS — F0280 Dementia in other diseases classified elsewhere without behavioral disturbance: Secondary | ICD-10-CM | POA: Diagnosis not present

## 2020-10-02 DIAGNOSIS — I129 Hypertensive chronic kidney disease with stage 1 through stage 4 chronic kidney disease, or unspecified chronic kidney disease: Secondary | ICD-10-CM | POA: Diagnosis not present

## 2020-10-02 DIAGNOSIS — E46 Unspecified protein-calorie malnutrition: Secondary | ICD-10-CM | POA: Diagnosis not present

## 2020-10-04 DIAGNOSIS — I129 Hypertensive chronic kidney disease with stage 1 through stage 4 chronic kidney disease, or unspecified chronic kidney disease: Secondary | ICD-10-CM | POA: Diagnosis not present

## 2020-10-04 DIAGNOSIS — F0150 Vascular dementia without behavioral disturbance: Secondary | ICD-10-CM | POA: Diagnosis not present

## 2020-10-04 DIAGNOSIS — F0280 Dementia in other diseases classified elsewhere without behavioral disturbance: Secondary | ICD-10-CM | POA: Diagnosis not present

## 2020-10-04 DIAGNOSIS — N182 Chronic kidney disease, stage 2 (mild): Secondary | ICD-10-CM | POA: Diagnosis not present

## 2020-10-04 DIAGNOSIS — E46 Unspecified protein-calorie malnutrition: Secondary | ICD-10-CM | POA: Diagnosis not present

## 2020-10-04 DIAGNOSIS — G309 Alzheimer's disease, unspecified: Secondary | ICD-10-CM | POA: Diagnosis not present

## 2021-01-01 DIAGNOSIS — F0150 Vascular dementia without behavioral disturbance: Secondary | ICD-10-CM | POA: Diagnosis not present

## 2021-01-01 DIAGNOSIS — Z0000 Encounter for general adult medical examination without abnormal findings: Secondary | ICD-10-CM | POA: Diagnosis not present

## 2021-01-01 DIAGNOSIS — N182 Chronic kidney disease, stage 2 (mild): Secondary | ICD-10-CM | POA: Diagnosis not present

## 2021-01-01 DIAGNOSIS — Z853 Personal history of malignant neoplasm of breast: Secondary | ICD-10-CM | POA: Diagnosis not present

## 2021-01-01 DIAGNOSIS — Z1389 Encounter for screening for other disorder: Secondary | ICD-10-CM | POA: Diagnosis not present

## 2021-01-01 DIAGNOSIS — I129 Hypertensive chronic kidney disease with stage 1 through stage 4 chronic kidney disease, or unspecified chronic kidney disease: Secondary | ICD-10-CM | POA: Diagnosis not present

## 2021-01-01 DIAGNOSIS — J449 Chronic obstructive pulmonary disease, unspecified: Secondary | ICD-10-CM | POA: Diagnosis not present

## 2021-01-01 DIAGNOSIS — E46 Unspecified protein-calorie malnutrition: Secondary | ICD-10-CM | POA: Diagnosis not present

## 2021-01-06 ENCOUNTER — Telehealth: Payer: Self-pay

## 2021-01-06 DIAGNOSIS — Z515 Encounter for palliative care: Secondary | ICD-10-CM

## 2021-01-06 NOTE — Telephone Encounter (Signed)
(  5:03 pm) SW attempted to introduce patient's daughter/PCG-Karen to palliative care services. [name] declined stating her mother did not need these services. SW provided resources in-home caregivers, the day program at [employer] and pharmacy that could provide COVID vaccines in the home. She took SW information and advised that she will call if her mother needed palliative services in the future.

## 2021-02-16 ENCOUNTER — Inpatient Hospital Stay (HOSPITAL_COMMUNITY)
Admission: EM | Admit: 2021-02-16 | Discharge: 2021-02-21 | DRG: 064 | Disposition: A | Discharge status: Hospice | Payer: Medicare Other | Attending: Internal Medicine | Admitting: Internal Medicine

## 2021-02-16 ENCOUNTER — Emergency Department (HOSPITAL_COMMUNITY): Payer: Medicare Other

## 2021-02-16 ENCOUNTER — Other Ambulatory Visit: Payer: Self-pay

## 2021-02-16 DIAGNOSIS — G309 Alzheimer's disease, unspecified: Secondary | ICD-10-CM | POA: Diagnosis not present

## 2021-02-16 DIAGNOSIS — Z515 Encounter for palliative care: Secondary | ICD-10-CM

## 2021-02-16 DIAGNOSIS — Z7982 Long term (current) use of aspirin: Secondary | ICD-10-CM

## 2021-02-16 DIAGNOSIS — I609 Nontraumatic subarachnoid hemorrhage, unspecified: Secondary | ICD-10-CM | POA: Diagnosis not present

## 2021-02-16 DIAGNOSIS — Z87891 Personal history of nicotine dependence: Secondary | ICD-10-CM

## 2021-02-16 DIAGNOSIS — Z79899 Other long term (current) drug therapy: Secondary | ICD-10-CM | POA: Diagnosis not present

## 2021-02-16 DIAGNOSIS — R0902 Hypoxemia: Secondary | ICD-10-CM | POA: Diagnosis not present

## 2021-02-16 DIAGNOSIS — Z9049 Acquired absence of other specified parts of digestive tract: Secondary | ICD-10-CM | POA: Diagnosis not present

## 2021-02-16 DIAGNOSIS — Z7189 Other specified counseling: Secondary | ICD-10-CM | POA: Diagnosis not present

## 2021-02-16 DIAGNOSIS — F0390 Unspecified dementia without behavioral disturbance: Secondary | ICD-10-CM | POA: Diagnosis not present

## 2021-02-16 DIAGNOSIS — Z20822 Contact with and (suspected) exposure to covid-19: Secondary | ICD-10-CM | POA: Diagnosis present

## 2021-02-16 DIAGNOSIS — I616 Nontraumatic intracerebral hemorrhage, multiple localized: Secondary | ICD-10-CM | POA: Diagnosis not present

## 2021-02-16 DIAGNOSIS — Z9012 Acquired absence of left breast and nipple: Secondary | ICD-10-CM

## 2021-02-16 DIAGNOSIS — R1310 Dysphagia, unspecified: Secondary | ICD-10-CM | POA: Diagnosis present

## 2021-02-16 DIAGNOSIS — S06340A Traumatic hemorrhage of right cerebrum without loss of consciousness, initial encounter: Secondary | ICD-10-CM | POA: Diagnosis not present

## 2021-02-16 DIAGNOSIS — I611 Nontraumatic intracerebral hemorrhage in hemisphere, cortical: Secondary | ICD-10-CM | POA: Diagnosis not present

## 2021-02-16 DIAGNOSIS — M1990 Unspecified osteoarthritis, unspecified site: Secondary | ICD-10-CM | POA: Diagnosis present

## 2021-02-16 DIAGNOSIS — F0280 Dementia in other diseases classified elsewhere without behavioral disturbance: Secondary | ICD-10-CM | POA: Diagnosis present

## 2021-02-16 DIAGNOSIS — R29810 Facial weakness: Secondary | ICD-10-CM | POA: Diagnosis present

## 2021-02-16 DIAGNOSIS — G936 Cerebral edema: Secondary | ICD-10-CM | POA: Diagnosis not present

## 2021-02-16 DIAGNOSIS — I484 Atypical atrial flutter: Secondary | ICD-10-CM | POA: Diagnosis not present

## 2021-02-16 DIAGNOSIS — Z853 Personal history of malignant neoplasm of breast: Secondary | ICD-10-CM

## 2021-02-16 DIAGNOSIS — I619 Nontraumatic intracerebral hemorrhage, unspecified: Principal | ICD-10-CM | POA: Diagnosis present

## 2021-02-16 DIAGNOSIS — I10 Essential (primary) hypertension: Secondary | ICD-10-CM | POA: Diagnosis not present

## 2021-02-16 DIAGNOSIS — R54 Age-related physical debility: Secondary | ICD-10-CM | POA: Diagnosis not present

## 2021-02-16 DIAGNOSIS — Z6821 Body mass index (BMI) 21.0-21.9, adult: Secondary | ICD-10-CM | POA: Diagnosis not present

## 2021-02-16 DIAGNOSIS — G8194 Hemiplegia, unspecified affecting left nondominant side: Secondary | ICD-10-CM | POA: Diagnosis not present

## 2021-02-16 DIAGNOSIS — G319 Degenerative disease of nervous system, unspecified: Secondary | ICD-10-CM | POA: Diagnosis not present

## 2021-02-16 DIAGNOSIS — E43 Unspecified severe protein-calorie malnutrition: Secondary | ICD-10-CM | POA: Diagnosis not present

## 2021-02-16 DIAGNOSIS — R0689 Other abnormalities of breathing: Secondary | ICD-10-CM | POA: Diagnosis not present

## 2021-02-16 DIAGNOSIS — Z66 Do not resuscitate: Secondary | ICD-10-CM | POA: Diagnosis not present

## 2021-02-16 DIAGNOSIS — Z7401 Bed confinement status: Secondary | ICD-10-CM | POA: Diagnosis not present

## 2021-02-16 DIAGNOSIS — R634 Abnormal weight loss: Secondary | ICD-10-CM | POA: Diagnosis not present

## 2021-02-16 DIAGNOSIS — R410 Disorientation, unspecified: Secondary | ICD-10-CM | POA: Diagnosis not present

## 2021-02-16 DIAGNOSIS — I6340 Cerebral infarction due to embolism of unspecified cerebral artery: Secondary | ICD-10-CM | POA: Diagnosis not present

## 2021-02-16 DIAGNOSIS — R404 Transient alteration of awareness: Secondary | ICD-10-CM | POA: Diagnosis not present

## 2021-02-16 DIAGNOSIS — I69354 Hemiplegia and hemiparesis following cerebral infarction affecting left non-dominant side: Secondary | ICD-10-CM | POA: Diagnosis not present

## 2021-02-16 DIAGNOSIS — R9431 Abnormal electrocardiogram [ECG] [EKG]: Secondary | ICD-10-CM | POA: Diagnosis not present

## 2021-02-16 DIAGNOSIS — I447 Left bundle-branch block, unspecified: Secondary | ICD-10-CM | POA: Diagnosis not present

## 2021-02-16 DIAGNOSIS — S066X0A Traumatic subarachnoid hemorrhage without loss of consciousness, initial encounter: Secondary | ICD-10-CM | POA: Diagnosis not present

## 2021-02-16 DIAGNOSIS — R4182 Altered mental status, unspecified: Secondary | ICD-10-CM | POA: Diagnosis not present

## 2021-02-16 DIAGNOSIS — R531 Weakness: Secondary | ICD-10-CM | POA: Diagnosis not present

## 2021-02-16 LAB — BLOOD GAS, VENOUS
Acid-Base Excess: 1.1 mmol/L (ref 0.0–2.0)
Bicarbonate: 26.3 mmol/L (ref 20.0–28.0)
O2 Saturation: 77.9 %
Patient temperature: 98.6
pCO2, Ven: 46.5 mmHg (ref 44.0–60.0)
pH, Ven: 7.371 (ref 7.250–7.430)
pO2, Ven: 45.4 mmHg — ABNORMAL HIGH (ref 32.0–45.0)

## 2021-02-16 LAB — CBC WITH DIFFERENTIAL/PLATELET
Abs Immature Granulocytes: 0.07 10*3/uL (ref 0.00–0.07)
Basophils Absolute: 0 10*3/uL (ref 0.0–0.1)
Basophils Relative: 0 %
Eosinophils Absolute: 0 10*3/uL (ref 0.0–0.5)
Eosinophils Relative: 0 %
HCT: 38.4 % (ref 36.0–46.0)
Hemoglobin: 12.4 g/dL (ref 12.0–15.0)
Immature Granulocytes: 1 %
Lymphocytes Relative: 5 %
Lymphs Abs: 0.6 10*3/uL — ABNORMAL LOW (ref 0.7–4.0)
MCH: 29.5 pg (ref 26.0–34.0)
MCHC: 32.3 g/dL (ref 30.0–36.0)
MCV: 91.4 fL (ref 80.0–100.0)
Monocytes Absolute: 0.8 10*3/uL (ref 0.1–1.0)
Monocytes Relative: 7 %
Neutro Abs: 9.6 10*3/uL — ABNORMAL HIGH (ref 1.7–7.7)
Neutrophils Relative %: 87 %
Platelets: 249 10*3/uL (ref 150–400)
RBC: 4.2 MIL/uL (ref 3.87–5.11)
RDW: 14.4 % (ref 11.5–15.5)
WBC: 11 10*3/uL — ABNORMAL HIGH (ref 4.0–10.5)
nRBC: 0 % (ref 0.0–0.2)

## 2021-02-16 LAB — COMPREHENSIVE METABOLIC PANEL
ALT: 10 U/L (ref 0–44)
AST: 20 U/L (ref 15–41)
Albumin: 3.8 g/dL (ref 3.5–5.0)
Alkaline Phosphatase: 66 U/L (ref 38–126)
Anion gap: 9 (ref 5–15)
BUN: 19 mg/dL (ref 8–23)
CO2: 27 mmol/L (ref 22–32)
Calcium: 9 mg/dL (ref 8.9–10.3)
Chloride: 105 mmol/L (ref 98–111)
Creatinine, Ser: 0.7 mg/dL (ref 0.44–1.00)
GFR, Estimated: >60 mL/min (ref >60)
Glucose, Bld: 120 mg/dL — ABNORMAL HIGH (ref 70–99)
Potassium: 3.6 mmol/L (ref 3.5–5.1)
Sodium: 141 mmol/L (ref 135–145)
Total Bilirubin: 1.2 mg/dL (ref 0.3–1.2)
Total Protein: 7.1 g/dL (ref 6.5–8.1)

## 2021-02-16 LAB — TROPONIN I (HIGH SENSITIVITY)
Troponin I (High Sensitivity): 19 ng/L — ABNORMAL HIGH (ref <18)
Troponin I (High Sensitivity): 31 ng/L — ABNORMAL HIGH (ref <18)

## 2021-02-16 LAB — RESP PANEL BY RT-PCR (FLU A&B, COVID) ARPGX2
Influenza A by PCR: NEGATIVE
Influenza B by PCR: NEGATIVE
SARS Coronavirus 2 by RT PCR: NEGATIVE

## 2021-02-16 LAB — LACTIC ACID, PLASMA: Lactic Acid, Venous: 1.9 mmol/L (ref 0.5–1.9)

## 2021-02-16 LAB — GLUCOSE, CAPILLARY: Glucose-Capillary: 98 mg/dL (ref 70–99)

## 2021-02-16 LAB — MRSA NEXT GEN BY PCR, NASAL: MRSA by PCR Next Gen: NOT DETECTED

## 2021-02-16 LAB — TSH: TSH: 2.628 u[IU]/mL (ref 0.350–4.500)

## 2021-02-16 LAB — CBG MONITORING, ED: Glucose-Capillary: 106 mg/dL — ABNORMAL HIGH (ref 70–99)

## 2021-02-16 LAB — AMMONIA: Ammonia: 12 umol/L (ref 9–35)

## 2021-02-16 LAB — D-DIMER, QUANTITATIVE: D-Dimer, Quant: 2.86 ug/mL-FEU — ABNORMAL HIGH (ref 0.00–0.50)

## 2021-02-16 MED ORDER — ORAL CARE MOUTH RINSE
15.0000 mL | Freq: Two times a day (BID) | OROMUCOSAL | Status: DC
  Administered 2021-02-17 – 2021-02-20 (×7): 15 mL via OROMUCOSAL

## 2021-02-16 MED ORDER — CHLORHEXIDINE GLUCONATE 0.12 % MT SOLN
15.0000 mL | Freq: Two times a day (BID) | OROMUCOSAL | Status: DC
  Administered 2021-02-16 – 2021-02-20 (×9): 15 mL via OROMUCOSAL
  Filled 2021-02-16 (×6): qty 15

## 2021-02-16 MED ORDER — CLEVIDIPINE BUTYRATE 0.5 MG/ML IV EMUL
0.0000 mg/h | INTRAVENOUS | Status: DC
  Administered 2021-02-16: 2 mg/h via INTRAVENOUS
  Administered 2021-02-16 – 2021-02-17 (×2): 1 mg/h via INTRAVENOUS
  Filled 2021-02-16 (×3): qty 50

## 2021-02-16 MED ORDER — STROKE: EARLY STAGES OF RECOVERY BOOK
Freq: Once | Status: DC
  Filled 2021-02-16 (×3): qty 1

## 2021-02-16 MED ORDER — ACETAMINOPHEN 325 MG PO TABS: 650.0000 mg | ORAL_TABLET | ORAL | Status: DC | PRN

## 2021-02-16 MED ORDER — CHLORHEXIDINE GLUCONATE CLOTH 2 % EX PADS
6.0000 | MEDICATED_PAD | Freq: Every day | CUTANEOUS | Status: DC
  Administered 2021-02-17 – 2021-02-19 (×3): 6 via TOPICAL

## 2021-02-16 MED ORDER — ACETAMINOPHEN 160 MG/5ML PO SOLN: 650.0000 mg | ORAL | Status: DC | PRN

## 2021-02-16 MED ORDER — ACETAMINOPHEN 650 MG RE SUPP: 650.0000 mg | RECTAL | Status: DC | PRN

## 2021-02-16 MED ORDER — LACTATED RINGERS IV BOLUS
500.0000 mL | Freq: Once | INTRAVENOUS | Status: AC
  Administered 2021-02-16: 500 mL via INTRAVENOUS

## 2021-02-16 MED ORDER — SENNOSIDES-DOCUSATE SODIUM 8.6-50 MG PO TABS
1.0000 | ORAL_TABLET | Freq: Two times a day (BID) | ORAL | Status: DC
  Administered 2021-02-18 – 2021-02-20 (×6): 1 via ORAL
  Filled 2021-02-16 (×6): qty 1

## 2021-02-16 MED ORDER — PANTOPRAZOLE SODIUM 40 MG IV SOLR
40.0000 mg | Freq: Every day | INTRAVENOUS | Status: DC
  Administered 2021-02-16 – 2021-02-19 (×4): 40 mg via INTRAVENOUS
  Filled 2021-02-16 (×4): qty 40

## 2021-02-16 NOTE — ED Provider Notes (Addendum)
[place] DEPT Provider Note   CSN: [id number] Arrival date & time: 02/16/21  1027     History Chief Complaint  Patient presents with   Weakness   Altered Mental Status    LUN BREIT is a 87 y.o. female.  HPI  87 year old female with a history of Alzheimer's dementia presenting via EMS from home for altered mental status and fever starting a day and a half ago.  The patient is alert and oriented x2 at baseline.  The history is provided by the patient's daughter who states that around evening time Friday night, the patient started to develop hiccups.  Around Saturday morning the patient was notably altered from her baseline mental status and has since become nonverbal.  She was brought in by EMS status post 500 cc normal saline fluid bolus, hemodynamically stable, saturating 91 to 92% on room air subsequently improved to 4 L O2 via nasal cannula, temperature 99.5 via tympanic thermometer, CBG 149.   Past Medical History:  Diagnosis Date   Alzheimer's dementia ([place])    "dx'd 03/11/2012" (03/17/2012)   Arthritis    "hands occasionally" (03/17/2012)   Breast cancer, left ([place])    Mass of breast, right    "to have bx 03/21/2012" (03/17/2012)   Pneumonia    "twice in the 1960's when she was smoking" (03/17/2012)   Short-term memory loss    Syncope and collapse 03/17/2012   no loss of consciousness     Patient Active Problem List   Diagnosis Date Noted   ICH (intracerebral hemorrhage) ([place]) 02/16/2021   Malnutrition ([place]) 03/21/2012   Inadequate oral intake 03/21/2012   Constipation 03/20/2012   Abdominal pain 03/19/2012   Hypotension 03/18/2012   Hypokalemia 03/18/2012   Weakness generalized 03/18/2012   Breast mass, right 03/18/2012   Near syncope 03/17/2012   Dementia [facility])     Past Surgical History:  Procedure Laterality Date   APPENDECTOMY     "as a child"   CHOLECYSTECTOMY  2000's   MASTECTOMY  1960's   left     OB  History   No obstetric history on file.     No family history on file.  Social History   Tobacco Use   Smoking status: Former    Packs/day: 0.50    Years: 25.00    Pack years: 12.50    Types: Cigarettes   Smokeless tobacco: Never  Substance Use Topics   Alcohol use: Yes    Comment: 03/17/2012 "drinks wine not all that much; maybe once/yr"   Drug use: No    Home Medications Prior to Admission medications   Medication Sig Start Date End Date Taking? Authorizing Provider  aspirin EC 81 MG EC tablet Take 1 tablet (81 mg total) by mouth daily. 03/24/12   Black, Lezlie Octave, NP  calcium-vitamin D (OSCAL WITH D) 500-200 MG-UNIT per tablet Take 1 tablet by mouth daily.    [provider]  docusate sodium 100 MG CAPS Take 100 mg by mouth 2 (two) times daily. 03/24/12   Black, Lezlie Octave, NP  feeding supplement (ENSURE COMPLETE) LIQD Take 237 mLs by mouth 2 (two) times daily between meals. 03/24/12   Black, Lezlie Octave, NP  ibuprofen (ADVIL,MOTRIN) 400 MG tablet Take 1 tablet (400 mg total) by mouth every 6 (six) hours as needed. 03/24/12   Black, Lezlie Octave, NP  Multiple Vitamin (MULTIVITAMIN WITH MINERALS) TABS Take 1 tablet by mouth daily.    [provider]  simethicone (  MYLICON) 80 MG chewable tablet Chew 1 tablet (80 mg total) by mouth every 6 (six) hours as needed for flatulence. 03/24/12   Black, Lezlie Octave, NP    Allergies    Patient has no known allergies.  Review of Systems   Review of Systems  Unable to perform ROS: Dementia   Physical Exam Updated Vital Signs BP (!) 145/69   Pulse 70   Temp 99.7 F (37.6 C) (Rectal)   Resp 12   Ht 5' (1.524 m)   Wt 49.9 kg   SpO2 100%   BMI 21.48 kg/m   Physical Exam Vitals and nursing note reviewed.  Constitutional:      General: She is not in acute distress. HENT:     Head: Normocephalic and atraumatic.  Eyes:     Conjunctiva/sclera: Conjunctivae normal.     Pupils: Pupils are equal, round, and reactive to light.   Cardiovascular:     Rate and Rhythm: Normal rate and regular rhythm.  Pulmonary:     Effort: Pulmonary effort is normal. No respiratory distress.  Abdominal:     General: There is no distension.     Tenderness: There is no guarding.  Musculoskeletal:        General: No deformity or signs of injury.     Cervical back: Normal range of motion and neck supple.  Skin:    Findings: No lesion or rash.  Neurological:     Mental Status: She is alert. She is disoriented.     GCS: GCS eye subscore is 4. GCS verbal subscore is 3. GCS motor subscore is 5.     Cranial Nerves: Cranial nerves are intact.     Motor: Motor function is intact.     Comments: Moving all four extremities in response to pain.no clear facial droop. Difficulty cooperating with exam. Intact blink to threat    ED Results / Procedures / Treatments   Labs (all labs ordered are listed, but only abnormal results are displayed) Labs Reviewed  COMPREHENSIVE METABOLIC PANEL - Abnormal; Notable for the following components:      Result Value   Glucose, Bld 120 (*)    All other components within normal limits  CBC WITH DIFFERENTIAL/PLATELET - Abnormal; Notable for the following components:   WBC 11.0 (*)    Neutro Abs 9.6 (*)    Lymphs Abs 0.6 (*)    All other components within normal limits  BLOOD GAS, VENOUS - Abnormal; Notable for the following components:   pO2, Ven 45.4 (*)    All other components within normal limits  D-DIMER, QUANTITATIVE - Abnormal; Notable for the following components:   D-Dimer, Quant 2.86 (*)    All other components within normal limits  CBG MONITORING, ED - Abnormal; Notable for the following components:   Glucose-Capillary 106 (*)    All other components within normal limits  TROPONIN I (HIGH SENSITIVITY) - Abnormal; Notable for the following components:   Troponin I (High Sensitivity) 19 (*)    All other components within normal limits  RESP PANEL BY RT-PCR (FLU A&B, COVID) ARPGX2  CULTURE,  BLOOD (ROUTINE X 2)  CULTURE, BLOOD (ROUTINE X 2)  URINE CULTURE  AMMONIA  LACTIC ACID, PLASMA  TSH  URINALYSIS, COMPLETE (UACMP) WITH MICROSCOPIC  CBG MONITORING, ED  TROPONIN I (HIGH SENSITIVITY)    EKG EKG Interpretation  Date/Time:  Sunday February 16 2021 11:14:33 EDT Ventricular Rate:  75 PR Interval:  171 QRS Duration: 127 QT Interval:  443  QTC Calculation: 495 R Axis:   -19 Text Interpretation: Sinus rhythm Probable left atrial enlargement Left bundle branch block Negative Sgarbossa's criteria Confirmed by Regan Lemming ([id number]) on 02/16/2021 11:16:15 AM  Radiology CT HEAD WO CONTRAST  Result Date: 02/16/2021 CLINICAL DATA:  Mental status change, unknown cause EXAM: CT HEAD WITHOUT CONTRAST TECHNIQUE: Contiguous axial images were obtained from the base of the skull through the vertex without intravenous contrast. COMPARISON:  CT head August 19, 2020. FINDINGS: Brain: Large acute intraparenchymal hemorrhage in the right frontal lobe, measuring up to 6.0 x 4.8 x 5.3 cm (estimated volume of 76 mL). Small volume of adjacent extra-axial extension of hemorrhage and moderate surrounding edema. Mass effect on the adjacent frontal horn of the right lateral ventricle with approximately 5 mm of leftward midline shift anteriorly. Acute hemorrhage abuts the lateral ventricle without definite intraventricular extension. Additional acute intraparenchymal hemorrhage in the inferior left cerebellum measuring approximately 1.1 x 2.1 x 1.0 (estimated volume of 1.2 mL). Mild surrounding edema without significant mass effect. No evidence of acute large vascular territory infarct. No evidence of hydrocephalus. Basal cisterns are patent. Moderate patchy white matter hypoattenuation, nonspecific but compatible with chronic microvascular ischemic disease. Mild for age atrophy. Vascular: Calcific intracranial atherosclerosis. No hyperdense vessel identified. Skull: No acute fracture. Sinuses/Orbits: Visualized  sinuses are clear. Other: No mastoid effusions. IMPRESSION: 1. Large acute intraparenchymal hemorrhage in the right frontal lobe, measuring up to 6.0 x 4.8 x 5.3 cm (estimated volume of 76 mL). Small volume of adjacent extra-axial extension of hemorrhage and moderate surrounding edema. Mass effect on the adjacent frontal horn of the right lateral ventricle with approximately 5 mm of leftward midline shift anteriorly. 2. Smaller acute intraparenchymal hemorrhage in the inferior left cerebellum measuring approximately 1.1 x 2.1 x 1.0 (estimated volume of 1.2 mL). Mild surrounding edema without significant mass effect. 3. The location of these hemorrhages is somewhat atypical for hypertensive bleeds or hemorrhagic conversion of infarcts. Recommend correlation with any history of trauma or anticoagulation. Amyloid angiopathy is a consideration, particularly given patient age. While no obvious mass was visible on the prior CT head from August 19, 2020, consider follow-up MRI with contrast after resolution of acute hemorrhage to exclude underlying masses. 4. Moderate chronic microvascular ischemic disease and mild atrophy. Findings discussed with Dr. Armandina Gemma via telephone at 11:34 AM. Electronically Signed   By: Margaretha Sheffield M.D.   On: 02/16/2021 11:50   DG Chest Port 1 View  Result Date: 02/16/2021 CLINICAL DATA:  Altered mental status. EXAM: PORTABLE CHEST 1 VIEW COMPARISON:  08/19/2020 FINDINGS: Normal sized heart. Clear lungs. Diffuse osteopenia. Mild to moderate thoracolumbar scoliosis. Cholecystectomy clips. IMPRESSION: No acute abnormality. Electronically Signed   By: Claudie Revering M.D.   On: 02/16/2021 13:18    Procedures .Critical Care Performed by: Regan Lemming, MD Authorized by: Regan Lemming, MD   Critical care provider statement:    Critical care time (minutes):  42   Critical care was necessary to treat or prevent imminent or life-threatening deterioration of the following conditions:  CNS  failure or compromise   Critical care was time spent personally by me on the following activities:  Discussions with consultants, evaluation of patient's response to treatment, examination of patient, ordering and performing treatments and interventions, ordering and review of laboratory studies, ordering and review of radiographic studies, pulse oximetry, re-evaluation of patient's condition, obtaining history from patient or surrogate and review of old charts   Care discussed with: admitting provider  Medications Ordered in ED Medications   stroke: mapping our early stages of recovery book (has no administration in time range)  acetaminophen (TYLENOL) tablet 650 mg (has no administration in time range)    Or  acetaminophen (TYLENOL) 160 MG/5ML solution 650 mg (has no administration in time range)    Or  acetaminophen (TYLENOL) suppository 650 mg (has no administration in time range)  senna-docusate (Senokot-S) tablet 1 tablet (has no administration in time range)  pantoprazole (PROTONIX) injection 40 mg (has no administration in time range)  clevidipine (CLEVIPREX) infusion 0.5 mg/mL (has no administration in time range)  lactated ringers bolus 500 mL (500 mLs Intravenous New Bag/Given 02/16/21 1113)    ED Course  I have reviewed the triage vital signs and the nursing notes.  Pertinent labs & imaging results that were available during my care of the patient were reviewed by me and considered in my medical decision making (see chart for details).    MDM Rules/Calculators/A&P                           87 year old female with a history of Alzheimer's dementia presenting via EMS from home for altered mental status and fever starting a day and a half ago.  The patient is alert and oriented x2 at baseline.  The history is provided by the patient's daughter who states that around evening time Friday night, the patient started to develop hiccups.  Around Saturday morning the patient was  notably altered from her baseline mental status and has since become nonverbal.  She was brought in by EMS status post 500 cc normal saline fluid bolus, hemodynamically stable, saturating 91 to 92% on room air subsequently improved to 4 L O2 via nasal cannula, temperature 99.5 via tympanic thermometer, CBG 149.   Dx: CVA, hemorrhage, electrolyte abnormality, other toxic metabolic derangement, infection.  11:34 AM After looking at the patient's CT head, I see a large right frontal intraparenchymal hemorrhage.  The patient has been acting abnormally for the past 2 days.  The patient's daughter was bedside.  She is currently protecting her airway, nonverbal.  After a discussion with the patient's daughter (she is currently her legal guardian) the patient's daughter made the determination that it would be in her best interest to be DNR/DNI.  CODE STATUS updated.  I spoke with neurology who recommended medical management with blood pressure control.  The patient remained mildly hypertensive BP 145/69.  Neurology ordered a Cleviprex drip.  Plan to admit the patient to the neuro ICU at [facility] health for repeat CT head, MRI imaging of the brain due to concern for possible amyloid angiopathy as the etiology of the patient's hemorrhages.  I explained to the likely poor prognosis of the patient and unpredictability of the patient's course over the next few days to the patient's daughter bedside. On reassessment, the patient appears to have a right gaze preference now, intact blink to threat bilaterally, with weakness in the L hemibody but still will move all four extremities in response to pain. Daughter aware situation evolving. The patient did experience a slight decline in GCS prior to transport to [facility].    Final Clinical Impression(s) / ED Diagnoses Final diagnoses:  Intraparenchymal hemorrhage of brain [facility])    Rx / DC Orders ED Discharge Orders     None        Regan Lemming, MD 02/16/21 1423     Regan Lemming, MD 02/17/21 2341

## 2021-02-16 NOTE — H&P (Addendum)
Admission H&P    Chief Complaint: Acute onset of left sided weakness  HPI: Laura Colon is an 87 y.o. female with Alzheimer's dementia, arthritis, and history of left breast cancer s/p mastectomy who presented to the [facility] ED this morning with AMS and fever starting a day and a half PTA. Patient was in her [place] until Friday, when she started to have hiccups. On Saturday morning she was noted to be altered and later became nonverbal. EMS was called and she was brought in to the [facility] ED for evaluation. GCS was 12 on arrival. She was moving all 4 extremities to pain. She was mildly hypertensive. Temperature 99.5 via tympanic thermometer.  CT head revealed a large early subacute right frontal lobe ICH. This was discussed with the patient's daughter, who made the determination that it would be in her best interest to be DNR/DNI.    At baseline the patient is alert and oriented x 2.     Past Medical History:  Diagnosis Date   Alzheimer's dementia ([place])    "dx'd 03/11/2012" (03/17/2012)   Arthritis    "hands occasionally" (03/17/2012)   Breast cancer, left ([place])    Mass of breast, right    "to have bx 03/21/2012" (03/17/2012)   Pneumonia    "twice in the 1960's when she was smoking" (03/17/2012)   Short-term memory loss    Syncope and collapse 03/17/2012   no loss of consciousness     Past Surgical History:  Procedure Laterality Date   APPENDECTOMY     "as a child"   CHOLECYSTECTOMY  2000's   MASTECTOMY  1960's   left    No family history on file. Social History:  reports that she has quit smoking. Her smoking use included cigarettes. She has a 12.50 pack-year smoking history. She has never used smokeless tobacco. She reports current alcohol use. She reports that she does not use drugs.  Allergies: No Known Allergies  Medications Prior to Admission  Medication Sig Dispense Refill   aspirin EC 81 MG EC tablet Take 1 tablet (81 mg total) by mouth daily.      calcium-vitamin D (OSCAL WITH D) 500-200 MG-UNIT per tablet Take 1 tablet by mouth daily.     docusate sodium 100 MG CAPS Take 100 mg by mouth 2 (two) times daily. 10 capsule    feeding supplement (ENSURE COMPLETE) LIQD Take 237 mLs by mouth 2 (two) times daily between meals.     ibuprofen (ADVIL,MOTRIN) 400 MG tablet Take 1 tablet (400 mg total) by mouth every 6 (six) hours as needed. 30 tablet    Multiple Vitamin (MULTIVITAMIN WITH MINERALS) TABS Take 1 tablet by mouth daily.     simethicone (MYLICON) 80 MG chewable tablet Chew 1 tablet (80 mg total) by mouth every 6 (six) hours as needed for flatulence. 30 tablet     ROS: The patient is unable to provide a reliable ROS due to cognitive/communication deficit.    Physical Examination: Blood pressure (!) 114/59, pulse 71, temperature 99.1 F (37.3 C), temperature source Oral, resp. rate 18, height 5' (1.524 m), weight 49.9 kg, SpO2 92 %.  HEENT-  [place]/AT  Cardiovascular - RRR Lungs - Respirations unlabored. Clear bilaterally with diminished airway sounds.  Abdomen - Nondistended Extremities - No edema. Warm and well perfused  Neurologic Examination: Mental Status: Awake and alert but with left sided neglect and abulia. Able to name her nose and thumb, but not her ear, pinky or index finger. Speech  is hypophonic and sparse, but with intact grammar and syntax in the context of short phrases. No dysarthria. Able to follow some simple commands. Not oriented to situation, location or time.  Cranial Nerves: II:  Blinks to threat on the right but not on the left. PERRL.  III,IV, VI: No ptosis. Right gaze deviation but can cross slowly past midline to the left with constant coaching if tracking examiner's face. Does not gaze fully to the left. No nystagmus.  V: Intact temp sensation on the right, but absent on the left.  VII: Mild left facial droop.  VIII: Hearing intact to commands IX,X: Hypophonic speech XI: Head is preferentially rotated to  the right.  XII: Did not protrude tongue to command. Motor: RUE: Squeezes examiner's hand, flexes at elbow and extends at elbow with 4/5 strength, but requires coaching. Laying preferentially on right side and does not elevate at shoulder.  LUE: Flaccid with no movement to command or any stimuli RLE: Does not cooperate with commands, except for some resistance to knee extension and wiggles toes slightly. Tone normal.  LLE: Wiggles toes minimally to command. Does not elevate antigravity or withdraw to light noxious.  Sensory: Insensate to LUE. Severely diminished sensation to irritative stimuli LLE. Normal reactivity to irritative stimuli on RUE and RLE.  Deep Tendon Reflexes:  1+ bilateral bracihioradialis and patellae. 0 bilateral achilles.  Right toe downgoing, left toe upgoing.  Cerebellar: Does not follow commands for testing.  Gait: Unable to assess    Results for orders placed or performed during the hospital encounter of 02/16/21 (from the past 48 hour(s))  Resp Panel by RT-PCR (Flu A&B, Covid) Nasopharyngeal Swab     Status: None   Collection Time: 02/16/21 11:05 AM   Specimen: Nasopharyngeal Swab; Nasopharyngeal(NP) swabs in vial transport medium  Result Value Ref Range   SARS Coronavirus 2 by RT PCR NEGATIVE NEGATIVE    Comment: (NOTE) SARS-CoV-2 target nucleic acids are NOT DETECTED.  The SARS-CoV-2 RNA is generally detectable in upper respiratory specimens during the acute phase of infection. The lowest concentration of SARS-CoV-2 viral copies this assay can detect is 138 copies/mL. A negative result does not preclude SARS-Cov-2 infection and should not be used as the sole basis for treatment or other patient management decisions. A negative result may occur with  improper specimen collection/handling, submission of specimen other than nasopharyngeal swab, presence of viral mutation(s) within the areas targeted by this assay, and inadequate number of viral copies(<138  copies/mL). A negative result must be combined with clinical observations, patient history, and epidemiological information. The expected result is Negative.  Fact Sheet for Patients:  EntrepreneurPulse.com.au  Fact Sheet for Healthcare Providers:  IncredibleEmployment.be  This test is no t yet approved or cleared by the [place] FDA and  has been authorized for detection and/or diagnosis of SARS-CoV-2 by FDA under an Emergency Use Authorization (EUA). This EUA will remain  in effect (meaning this test can be used) for the duration of the COVID-19 declaration under Section 564(b)(1) of the Act, 21 U.S.C.section 360bbb-3(b)(1), unless the authorization is terminated  or revoked sooner.       Influenza A by PCR NEGATIVE NEGATIVE   Influenza B by PCR NEGATIVE NEGATIVE    Comment: (NOTE) The Xpert Xpress SARS-CoV-2/FLU/RSV plus assay is intended as an aid in the diagnosis of influenza from Nasopharyngeal swab specimens and should not be used as a sole basis for treatment. Nasal washings and aspirates are unacceptable for Xpert Xpress SARS-CoV-2/FLU/RSV testing.  Fact Sheet for Patients: EntrepreneurPulse.com.au  Fact Sheet for Healthcare Providers: IncredibleEmployment.be  This test is not yet approved or cleared by the [place] FDA and has been authorized for detection and/or diagnosis of SARS-CoV-2 by FDA under an Emergency Use Authorization (EUA). This EUA will remain in effect (meaning this test can be used) for the duration of the COVID-19 declaration under Section 564(b)(1) of the Act, 21 U.S.C. section 360bbb-3(b)(1), unless the authorization is terminated or revoked.  Performed at [facility], [place] [address], [place], [place] [address]   Comprehensive metabolic panel     Status: Abnormal   Collection Time: 02/16/21 11:12 AM  Result Value Ref Range   Sodium 141 135 -  145 mmol/L   Potassium 3.6 3.5 - 5.1 mmol/L   Chloride 105 98 - 111 mmol/L   CO2 27 22 - 32 mmol/L   Glucose, Bld 120 (H) 70 - 99 mg/dL    Comment: Glucose reference range applies only to samples taken after fasting for at least 8 hours.   BUN 19 8 - 23 mg/dL   Creatinine, Ser 0.70 0.44 - 1.00 mg/dL   Calcium 9.0 8.9 - 10.3 mg/dL   Total Protein 7.1 6.5 - 8.1 g/dL   Albumin 3.8 3.5 - 5.0 g/dL   AST 20 15 - 41 U/L   ALT 10 0 - 44 U/L   Alkaline Phosphatase 66 38 - 126 U/L   Total Bilirubin 1.2 0.3 - 1.2 mg/dL   GFR, Estimated >60 >60 mL/min    Comment: (NOTE) Calculated using the CKD-EPI Creatinine Equation (2021)    Anion gap 9 5 - 15    Comment: Performed at [facility], [place] [address] [place], [place] [address]  CBC WITH DIFFERENTIAL     Status: Abnormal   Collection Time: 02/16/21 11:12 AM  Result Value Ref Range   WBC 11.0 (H) 4.0 - 10.5 K/uL   RBC 4.20 3.87 - 5.11 MIL/uL   Hemoglobin 12.4 12.0 - 15.0 g/dL   HCT 38.4 36.0 - 46.0 %   MCV 91.4 80.0 - 100.0 fL   MCH 29.5 26.0 - 34.0 pg   MCHC 32.3 30.0 - 36.0 g/dL   RDW 14.4 11.5 - 15.5 %   Platelets 249 150 - 400 K/uL   nRBC 0.0 0.0 - 0.2 %   Neutrophils Relative % 87 %   Neutro Abs 9.6 (H) 1.7 - 7.7 K/uL   Lymphocytes Relative 5 %   Lymphs Abs 0.6 (L) 0.7 - 4.0 K/uL   Monocytes Relative 7 %   Monocytes Absolute 0.8 0.1 - 1.0 K/uL   Eosinophils Relative 0 %   Eosinophils Absolute 0.0 0.0 - 0.5 K/uL   Basophils Relative 0 %   Basophils Absolute 0.0 0.0 - 0.1 K/uL   Immature Granulocytes 1 %   Abs Immature Granulocytes 0.07 0.00 - 0.07 K/uL    Comment: Performed at [facility], [place] [address] [place], [place] [address]  Ammonia     Status: None   Collection Time: 02/16/21 11:12 AM  Result Value Ref Range   Ammonia 12 9 - 35 umol/L    Comment: Performed at [facility], [place] [address] [place],  [address]  Lactic acid, plasma     Status: None    Collection Time: 02/16/21 11:12 AM  Result Value Ref Range   Lactic Acid, Venous 1.9 0.5 - 1.9 mmol/L    Comment: Performed at [facility], [place] [address], [place], [place] [address]  Blood gas, venous     Status: Abnormal   Collection Time: 02/16/21 11:12 AM  Result Value Ref Range   pH, Ven 7.371 7.250 - 7.430   pCO2, Ven 46.5 44.0 - 60.0 mmHg   pO2, Ven 45.4 (H) 32.0 - 45.0 mmHg   Bicarbonate 26.3 20.0 - 28.0 mmol/L   Acid-Base Excess 1.1 0.0 - 2.0 mmol/L   O2 Saturation 77.9 %   Patient temperature 98.6     Comment: Performed at [facility], [place] [address], [place], [place] [address]  TSH     Status: None   Collection Time: 02/16/21 11:12 AM  Result Value Ref Range   TSH 2.628 0.350 - 4.500 uIU/mL    Comment: Performed by a 3rd Generation assay with a functional sensitivity of <=0.01 uIU/mL. Performed at [facility], [place] [address] [place], [place] [address]   Troponin I (High Sensitivity)     Status: Abnormal   Collection Time: 02/16/21 11:12 AM  Result Value Ref Range   Troponin I (High Sensitivity) 19 (H) <18 ng/L    Comment: (NOTE) Elevated high sensitivity troponin I (hsTnI) values and significant  changes across serial measurements may suggest ACS but many other  chronic and acute conditions are known to elevate hsTnI results.  Refer to the "Links" section for chest pain algorithms and additional  guidance. Performed at [facility], [place] [address], [place], [place] [address]   D-dimer, quantitative     Status: Abnormal   Collection Time: 02/16/21 11:12 AM  Result Value Ref Range   D-Dimer, Quant 2.86 (H) 0.00 - 0.50 ug/mL-FEU    Comment: (NOTE) At the manufacturer cut-off value of 0.5 g/mL FEU, this assay has a negative predictive value of 95-100%.This assay is intended for use in conjunction with a clinical pretest probability (PTP) assessment model to exclude pulmonary embolism  (PE) and deep venous thrombosis (DVT) in outpatients suspected of PE or DVT. Results should be correlated with clinical presentation. Performed at [facility], [place] [address], [place], [place] [address]   CBG monitoring, ED     Status: Abnormal   Collection Time: 02/16/21 11:55 AM  Result Value Ref Range   Glucose-Capillary 106 (H) 70 - 99 mg/dL    Comment: Glucose reference range applies only to samples taken after fasting for at least 8 hours.  Troponin I (High Sensitivity)     Status: Abnormal   Collection Time: 02/16/21  2:50 PM  Result Value Ref Range   Troponin I (High Sensitivity) 31 (H) <18 ng/L    Comment: (NOTE) Elevated high sensitivity troponin I (hsTnI) values and significant  changes across serial measurements may suggest ACS but many other  chronic and acute conditions are known to elevate hsTnI results.  Refer to the "Links" section for chest pain algorithms and additional  guidance. Performed at [facility], [place] [address] [place], [place] [address]   MRSA Next Gen by PCR, Nasal     Status: None   Collection Time: 02/16/21  3:35 PM   Specimen: Nasal Mucosa; Nasal Swab  Result Value Ref Range   MRSA by PCR Next Gen NOT DETECTED NOT DETECTED    Comment: (NOTE) The GeneXpert MRSA Assay (FDA approved for NASAL specimens only), is one component of a comprehensive MRSA colonization surveillance program. It is not intended to diagnose MRSA infection nor to guide or monitor treatment for  MRSA infections. Test performance is not FDA approved in patients less than 30 years old. Performed at [facility], [place] [address], [place], [place] [address]    CT HEAD WO CONTRAST  Result Date: 02/16/2021 CLINICAL DATA:  Mental status change, unknown cause EXAM: CT HEAD WITHOUT CONTRAST TECHNIQUE: Contiguous axial images were obtained from the base of the skull through the vertex without intravenous contrast. COMPARISON:  CT head August 19, 2020.  FINDINGS: Brain: Large acute intraparenchymal hemorrhage in the right frontal lobe, measuring up to 6.0 x 4.8 x 5.3 cm (estimated volume of 76 mL). Small volume of adjacent extra-axial extension of hemorrhage and moderate surrounding edema. Mass effect on the adjacent frontal horn of the right lateral ventricle with approximately 5 mm of leftward midline shift anteriorly. Acute hemorrhage abuts the lateral ventricle without definite intraventricular extension. Additional acute intraparenchymal hemorrhage in the inferior left cerebellum measuring approximately 1.1 x 2.1 x 1.0 (estimated volume of 1.2 mL). Mild surrounding edema without significant mass effect. No evidence of acute large vascular territory infarct. No evidence of hydrocephalus. Basal cisterns are patent. Moderate patchy white matter hypoattenuation, nonspecific but compatible with chronic microvascular ischemic disease. Mild for age atrophy. Vascular: Calcific intracranial atherosclerosis. No hyperdense vessel identified. Skull: No acute fracture. Sinuses/Orbits: Visualized sinuses are clear. Other: No mastoid effusions. IMPRESSION: 1. Large acute intraparenchymal hemorrhage in the right frontal lobe, measuring up to 6.0 x 4.8 x 5.3 cm (estimated volume of 76 mL). Small volume of adjacent extra-axial extension of hemorrhage and moderate surrounding edema. Mass effect on the adjacent frontal horn of the right lateral ventricle with approximately 5 mm of leftward midline shift anteriorly. 2. Smaller acute intraparenchymal hemorrhage in the inferior left cerebellum measuring approximately 1.1 x 2.1 x 1.0 (estimated volume of 1.2 mL). Mild surrounding edema without significant mass effect. 3. The location of these hemorrhages is somewhat atypical for hypertensive bleeds or hemorrhagic conversion of infarcts. Recommend correlation with any history of trauma or anticoagulation. Amyloid angiopathy is a consideration, particularly given patient age. While no  obvious mass was visible on the prior CT head from August 19, 2020, consider follow-up MRI with contrast after resolution of acute hemorrhage to exclude underlying masses. 4. Moderate chronic microvascular ischemic disease and mild atrophy. Findings discussed with Dr. Armandina Gemma via telephone at 11:34 AM. Electronically Signed   By: Margaretha Sheffield M.D.   On: 02/16/2021 11:50   DG Chest Port 1 View  Result Date: 02/16/2021 CLINICAL DATA:  Altered mental status. EXAM: PORTABLE CHEST 1 VIEW COMPARISON:  08/19/2020 FINDINGS: Normal sized heart. Clear lungs. Diffuse osteopenia. Mild to moderate thoracolumbar scoliosis. Cholecystectomy clips. IMPRESSION: No acute abnormality. Electronically Signed   By: Claudie Revering M.D.   On: 02/16/2021 13:18     Assessment: 87 y.o. female with Alzheimer's dementia, arthritis, and history of left breast cancer s/p mastectomy who presented to the [facility] ED this morning with AMS and fever starting a day and a half PTA. Patient was in her [place] until Friday, when she started to have hiccups. On Saturday morning she was noted to be altered and later became nonverbal. EMS was called and she was brought in to the [facility] ED for evaluation. CT head revealed a large early subacute right frontal lobe ICH. This was discussed with the patient's daughter, who made the determination that it would be in her best interest to be DNR/DNI. The family did want maximal medical management for now, but no invasive procedures.  1. Exam reveals cognitive/communication deficit, left hemiplegia, left hemianesthesia, anosognosia and left hemineglect.  2. CT head:  Large acute intraparenchymal hemorrhage in the right frontal lobe, measuring up to 6.0 x 4.8 x 5.3 cm (estimated volume of 76 mL). Small volume of adjacent extra-axial extension of hemorrhage and moderate surrounding edema. Mass effect on the adjacent frontal horn of the right lateral ventricle with approximately 5 mm of leftward midline shift  anteriorly. Smaller acute intraparenchymal hemorrhage in the inferior left cerebellum measuring approximately 1.1 x 2.1 x 1.0 (estimated volume of 1.2 mL). Mild surrounding edema without significant mass effect. The location of these hemorrhages is somewhat atypical for hypertensive bleeds or hemorrhagic conversion of infarcts. Amyloid angiopathy is a consideration. 3. EKG: Sinus rhythm, Probable left atrial enlargement, Left bundle branch block, Negative Sgarbossa's criteria 4. TSH normal.  5. Mildly elevated troponin. Most likely secondary to [place] given no CP, no diaphoresis and no acute ischemic change on EKG.  6. Elevated D-dimer. Was not complaining of leg pain and no difficulty breathing. On SCDs. Will monitor for signs/symptoms of DVT.  7. Mildly elevated white count. Most likely reactive in the setting of large ICH.   Plan: 1. Admit to ICU under Neurology service 2. MRI/MRA of head 3. Carotid ultrasound 4. TTE 5. PT consult, OT consult, Speech consult 6. Cardiac telemetry 7. Frequent neuro checks 8. Will hold off on hypertonic saline for now. Repeat CT head scheduled for 12 hours after first CT.   9. BP management with clevidipine drip. Goal SBP < 140 10. No antiplatelet medications or anticoagulants. DVT prophylaxis with SCDs.  11. Frequent neuro checks.   45 minutes spent in the neurological evaluation and management of this critically ill patient with large, life-threatening intraparenchymal right frontal lobe hemorrhage.    Electronically signed: Dr. Kerney Elbe 02/16/2021, 7:52 PM

## 2021-02-16 NOTE — ED Notes (Signed)
Attempted report to 4N, informed charge nurse was at lunch. Left callback number.

## 2021-02-16 NOTE — ED Triage Notes (Signed)
Pt BIB [facility] from home for AMS, fever starting day and a half ago. No previous illness. Hx alzheimers, A&Ox2 at baseline, not able since ill. 18ga LF, 500cc NS. EKG sinus w LBBB  BP 154/91 RR 30 SpO2 91-92% RA 4L [place] 99% Temp 99.5 tympanic EtCO2 23-24 CBG 149

## 2021-02-17 ENCOUNTER — Inpatient Hospital Stay (HOSPITAL_COMMUNITY): Payer: Medicare Other

## 2021-02-17 MED ORDER — SODIUM CHLORIDE 0.9 % IV SOLN: INTRAVENOUS | Status: DC

## 2021-02-17 NOTE — Progress Notes (Signed)
OT Cancellation Note  Patient Details Name: Laura Colon MRN: [id number] DOB: 06-16-1923   Cancelled Treatment:    Reason Eval/Treat Not Completed: Active bedrest order. Have chat texted RN to let us know when pt is off of bedrest.  [place], OTR/L Acute Rehab Services Pager [phone] Office [phone]    Almon Register 02/17/2021, 7:22 AM

## 2021-02-17 NOTE — Evaluation (Signed)
Clinical/Bedside Swallow Evaluation Patient Details  Name: Laura Colon MRN: [id number] Date of Birth: 09/24/1923  Today's Date: 02/17/2021 Time: SLP Start Time (ACUTE ONLY): 104 SLP Stop Time (ACUTE ONLY): 1044 SLP Time Calculation (min) (ACUTE ONLY): 24 min  Past Medical History:  Past Medical History:  Diagnosis Date   Alzheimer's dementia ([place])    "dx'd 03/11/2012" (03/17/2012)   Arthritis    "hands occasionally" (03/17/2012)   Breast cancer, left ([place])    Mass of breast, right    "to have bx 03/21/2012" (03/17/2012)   Pneumonia    "twice in the 1960's when she was smoking" (03/17/2012)   Short-term memory loss    Syncope and collapse 03/17/2012   no loss of consciousness    Past Surgical History:  Past Surgical History:  Procedure Laterality Date   APPENDECTOMY     "as a child"   CHOLECYSTECTOMY  2000's   MASTECTOMY  1960's   left   HPI:  87 yo F with Alzheimer's dementia, arthritis, and hx of breast cancer s/p mastectomy. Pt admitted to [facility] ED 9/11 with AMS and fever; GCS 12. CT of the head revealed a large early subacute right frontal lobe ICH.   Assessment / Plan / Recommendation Clinical Impression  Pt was seen for a bedside swallow evaluation. Pt awake and alert with daughter at bedside. Daughter reports pt only has trouble with mixed vegetables at baseline and had no difficulty directly prior to admission. Oral mechanism exam revealed slight left facial droop and decreased lingual strength and ROM. Pt edentulous but wore dentures on top and bottom during session. SLP trialed small and large sips of thin liquid, puree, and regular solids. Oral phase for thin liquids largely intact but c/b prolonged mastication and prolonged oral transit for purees and regular solids. Pt exhibited delayed throat clear for all consistencies but showed no other overt s/s of aspiration. Daughter reports pt has difficulty with pills at baseline. Initiated discussion with  daughter regarding comfort care vs. instrumental assessment and did not appear that instrumental assessment needed at this time. Palliative consulted with pt and family for plan of care. Recommend regular/thin diet to allow for wider array of menu options; daughter educated on appropriate consistencies and agrees to order food her mother will enjoy while prioritizing safety. Administer medications crushed in puree for ease. SLP f/u not needed at this time. SLP Visit Diagnosis: Dysphagia, unspecified (R13.10)    Aspiration Risk  Moderate aspiration risk    Diet Recommendation Regular;Thin liquid (Dtr to order consistencies)   Liquid Administration via: Cup;Straw Medication Administration: Crushed with puree Supervision: Full supervision/cueing for compensatory strategies Compensations: Minimize environmental distractions;Slow rate;Small sips/bites Postural Changes: Seated upright at 90 degrees    Other  Recommendations Oral Care Recommendations: Oral care BID   Follow up Recommendations 24 hour supervision/assistance      Frequency and Duration            Prognosis Prognosis for Safe Diet Advancement: Good Barriers to Reach Goals: Cognitive deficits      Swallow Study   General Date of Onset: 02/16/21 HPI: 87 yo F with Alzheimer's dementia, arthritis, and hx of breast cancer s/p mastectomy. Pt admitted to [facility] ED 9/11 with AMS and fever; GCS 12. CT of the head revealed a large early subacute right frontal lobe ICH. Type of Study: Bedside Swallow Evaluation Previous Swallow Assessment: n/a Diet Prior to this Study: NPO Temperature Spikes Noted: No Respiratory Status: Room air History of Recent Intubation:  No Behavior/Cognition: Alert;Cooperative;Pleasant mood;Confused;Requires cueing Oral Cavity Assessment: Within Functional Limits Oral Care Completed by SLP: No Oral Cavity - Dentition: Dentures, top;Dentures, bottom Vision: Functional for self-feeding Self-Feeding  Abilities: Needs assist Patient Positioning: Upright in bed Baseline Vocal Quality: Normal (presbyphonia) Volitional Cough: Weak Volitional Swallow: Able to elicit    Oral/Motor/Sensory Function Overall Oral Motor/Sensory Function: Mild impairment Facial ROM: Reduced left;Suspected CN VII (facial) dysfunction Facial Symmetry: Abnormal symmetry left;Suspected CN VII (facial) dysfunction Lingual ROM: Reduced right;Reduced left Lingual Symmetry: Within Functional Limits Lingual Strength: Reduced Velum: Within Functional Limits   Ice Chips Ice chips: Not tested   Thin Liquid Thin Liquid: Impaired Presentation: Cup;Spoon;Straw Oral Phase Impairments: Reduced lingual movement/coordination Oral Phase Functional Implications: Left anterior spillage Pharyngeal  Phase Impairments: Throat Clearing - Immediate;Throat Clearing - Delayed    Nectar Thick Nectar Thick Liquid: Not tested   Honey Thick Honey Thick Liquid: Not tested   Puree Puree: Impaired Presentation: Self Fed;Spoon Pharyngeal Phase Impairments: Throat Clearing - Delayed   Solid     Solid: Impaired Presentation: Self Fed Oral Phase Functional Implications: Prolonged oral transit Pharyngeal Phase Impairments: Throat Clearing - Delayed      Dewitt Rota, SLP-Student  Dewitt Rota 02/17/2021,12:05 PM

## 2021-02-17 NOTE — Progress Notes (Signed)
PT Cancellation Note  Patient Details Name: Laura Colon MRN: [id number] DOB: September 17, 1923   Cancelled Treatment:    Reason Eval/Treat Not Completed: Patient not medically ready; patient remains on bedrest.  Will attempt again another day.   Reginia Naas 02/17/2021, 4:08 PM Magda Kiel, PT Acute Rehabilitation Services Pager:[phone] Office:[phone] 02/17/2021

## 2021-02-17 NOTE — Evaluation (Signed)
Physical Therapy Evaluation Patient Details Name: Laura Colon MRN: [id number] DOB: 1923/12/31 Today's Date: 02/17/2021  History of Present Illness  Laura Colon is an 87 y.o. female with Alzheimer's dementia, arthritis, and history of left breast cancer s/p mastectomy admitted with AMS progressing to nonverbal.  CT head revealed large early subacute R frontal ICH.  Clinical Impression  Patient presents with decreased mobility due to L neglect, decreased balance, poor safety awareness, decreased activity tolerance and she will benefit from skilled PT in the acute setting to maximize safety with mobility and for caregiver training.  Patient's daughter unsure she can care for her appropriately at home so may need SNF level of care at d/c.       Recommendations for follow up therapy are one component of a multi-disciplinary discharge planning process, led by the attending physician.  Recommendations may be updated based on patient status, additional functional criteria and insurance authorization.  Follow Up Recommendations SNF    Equipment Recommendations  Wheelchair cushion (measurements PT);Wheelchair (measurements PT);Hospital bed    Recommendations for Other Services       Precautions / Restrictions Precautions Precautions: Fall Precaution Comments: L neglect      Mobility  Bed Mobility Overal bed mobility: Needs Assistance Bed Mobility: Supine to Sit;Sit to Supine     Supine to sit: Mod assist;HOB elevated Sit to supine: Mod assist   General bed mobility comments: patient able to initiate coming to EOB, but difficulty with motor planning so assisted both legs off bed, pt able to scoot some forward; to supine assist for legs into bed    Transfers Overall transfer level: Needs assistance Equipment used: Rolling walker (2 wheeled) Transfers: Sit to/from [facility] Sit to Stand: Mod assist Stand pivot transfers: Max assist       General  transfer comment: assist to stand to walker for balance and safety with some posterior bias, to step to [facility] needed max A with RW for balance and sequencing, pt hesitant to sit due to cold seat on BSC.  Ambulation/Gait                Stairs            Wheelchair Mobility    Modified Rankin (Stroke Patients Only) Modified Rankin (Stroke Patients Only) Pre-Morbid Rankin Score: Moderate disability Modified Rankin: Moderately severe disability     Balance Overall balance assessment: Needs assistance   Sitting balance-Leahy Scale: Fair Sitting balance - Comments: EOB initially without LE support   Standing balance support: Bilateral upper extremity supported Standing balance-Leahy Scale: Poor Standing balance comment: UE support and min A for balance                             Pertinent Vitals/Pain Pain Assessment: No/denies pain    Home Living Family/patient expects to be discharged to:: Unsure Living Arrangements: Children                    Prior Function Level of Independence: Needs assistance   Gait / Transfers Assistance Needed: using walker since March when had acute progression of Alzheimer's versus TIA per daughter  ADL's / Homemaking Assistance Needed: daughter assists using wipes since pt with water aversion        Hand Dominance        Extremity/Trunk Assessment   Upper Extremity Assessment Upper Extremity Assessment: LUE deficits/detail LUE Deficits / Details: uses L hand functionally,  but per RN patient neglects that side    Lower Extremity Assessment Lower Extremity Assessment: Generalized weakness    Cervical / Trunk Assessment Cervical / Trunk Assessment: Kyphotic  Communication   Communication: HOH (mildly HOH)  Cognition Arousal/Alertness: Awake/alert Behavior During Therapy: WFL for tasks assessed/performed Overall Cognitive Status: History of cognitive impairments - at baseline                                  General Comments: daughter present and reports this time of day (4:30ish) she starts to sundown and has more difficulty getting her to follow directions      General Comments General comments (skin integrity, edema, etc.): Daughter present throughout, voicing concerns about being able to care for pt at home even with Hospice help.  RN also in the room as pt needing to urinate or be catheterized so all of [place] continued to encourage her to void and she finally did.    Exercises     Assessment/Plan    PT Assessment Patient needs continued PT services  PT Problem List Decreased strength;Decreased mobility;Decreased safety awareness;Decreased activity tolerance;Decreased balance;Decreased knowledge of use of DME;Impaired sensation;Decreased knowledge of precautions;Decreased coordination       PT Treatment Interventions DME instruction;Therapeutic activities;Cognitive remediation;Gait training;Therapeutic exercise;Patient/family education;Functional mobility training;Balance training;Neuromuscular re-education    PT Goals (Current goals can be found in the Care Plan section)  Acute Rehab PT Goals Patient Stated Goal: to get stronger PT Goal Formulation: With patient/family Time For Goal Achievement: 03/03/21 Potential to Achieve Goals: Fair    Frequency Min 3X/week   Barriers to discharge        Co-evaluation               AM-PAC PT "6 Clicks" Mobility  Outcome Measure Help needed turning from your back to your side while in a flat bed without using bedrails?: A Lot Help needed moving from lying on your back to sitting on the side of a flat bed without using bedrails?: A Lot Help needed moving to and from a bed to a chair (including a wheelchair)?: A Lot Help needed standing up from a chair using your arms (e.g., wheelchair or bedside chair)?: A Lot Help needed to walk in hospital room?: Total Help needed climbing 3-5 steps with a railing? : Total 6 Click  Score: 10    End of Session   Activity Tolerance: Patient tolerated treatment well Patient left: in bed;with call bell/phone within reach;with family/visitor present   PT Visit Diagnosis: Other abnormalities of gait and mobility (R26.89);Muscle weakness (generalized) (M62.81);Other symptoms and signs involving the nervous system (R29.898)    Time: 1630-1700 PT Time Calculation (min) (ACUTE ONLY): 30 min   Charges:   PT Evaluation $PT Eval Moderate Complexity: 1 Mod PT Treatments $Therapeutic Activity: 8-22 mins        Magda Kiel, PT Acute Rehabilitation Services [phone] [phone] 02/17/2021   Reginia Naas 02/17/2021, 5:32 PM

## 2021-02-17 NOTE — Progress Notes (Addendum)
STROKE TEAM PROGRESS NOTE   INTERVAL HISTORY 87 year old female with alzheimers dementia admitted with altered mental status. CT head shows large early subacute right frontal ICH on admission and newer left cerebellar bleed.  She is a DNR.  Blood pressure adequately controlled.  Neurological exam shows patient to be neurological exam shows patient to be awake and conversant but has dense left hemiplegia, neglect and right gaze deviation.  Her daughter is at the bedside providing support and context.   Vitals:   02/17/21 0600 02/17/21 0700 02/17/21 0737 02/17/21 0800  BP: (!) 108/55 122/64  (!) 107/59  Pulse: 63 71  63  Resp: 16 (!) 21    Temp:   98 F (36.7 C)   TempSrc:   Axillary   SpO2: 96% 98%  95%  Weight:      Height:       CBC:  Recent Labs  Lab 02/16/21 1112  WBC 11.0*  NEUTROABS 9.6*  HGB 12.4  HCT 38.4  MCV 91.4  PLT 0000000   Basic Metabolic Panel:  Recent Labs  Lab 02/16/21 1112  NA 141  K 3.6  CL 105  CO2 27  GLUCOSE 120*  BUN 19  CREATININE 0.70  CALCIUM 9.0    Lipid Panel: No results for input(s): CHOL, TRIG, HDL, CHOLHDL, VLDL, LDLCALC in the last 168 hours.  HgbA1c: No results for input(s): HGBA1C in the last 168 hours. Urine Drug Screen: No results for input(s): LABOPIA, COCAINSCRNUR, LABBENZ, AMPHETMU, THCU, LABBARB in the last 168 hours.  Alcohol Level No results for input(s): ETH in the last 168 hours.  IMAGING past 24 hours CT HEAD WO CONTRAST (5MM)  Result Date: 02/17/2021 CLINICAL DATA:  Follow-up examination for intracranial hemorrhage. EXAM: CT HEAD WITHOUT CONTRAST TECHNIQUE: Contiguous axial images were obtained from the base of the skull through the vertex without intravenous contrast. COMPARISON:  Prior CT from 02/16/2021. FINDINGS: Brain: Acute intraparenchymal hemorrhage positioned at the anterior right frontal lobe again seen, measuring 4.7 x 5.2 x 6.0 cm, not significantly changed from prior exam. Surrounding vasogenic edema is slightly  more pronounced, although right-to-left shift measures relatively similar at 5 mm. No intraventricular extension into the adjacent right lateral ventricle. Associated small volume adjacent subarachnoid hemorrhage. Additional acute hemorrhage within the inferior left cerebellum is relatively stable measuring approximately 2 cm. Mild surrounding edema without significant regional mass effect. No other acute intracranial hemorrhage. No acute large vessel territory infarct. Age-related cerebral atrophy with fairly advanced chronic microvascular ischemic disease again noted. No hydrocephalus or trapping. No significant extra-axial collection. Vascular: No hyperdense vessel. Calcified atherosclerosis present at skull base. Skull: Stable with no new finding. Sinuses/Orbits: Globes and orbital soft tissues demonstrate no acute finding. Ocular senescent calcifications noted. Paranasal sinuses and mastoid air cells remain largely clear. Other: None. IMPRESSION: 1. No significant interval change in size of large right frontal intraparenchymal hematoma. Surrounding vasogenic edema with regional mass effect and 5 mm of right-to-left shift, similar to previous. No intraventricular extension or other complication. 2. Additional smaller approximate 2 cm inferior left cerebellar hemorrhage without significant mass effect, stable. 3. No other new acute intracranial abnormality. Electronically Signed   By: Jeannine Boga M.D.   On: 02/17/2021 03:12   CT HEAD WO CONTRAST  Result Date: 02/16/2021 CLINICAL DATA:  Mental status change, unknown cause EXAM: CT HEAD WITHOUT CONTRAST TECHNIQUE: Contiguous axial images were obtained from the base of the skull through the vertex without intravenous contrast. COMPARISON:  CT head August 19, 2020. FINDINGS: Brain: Large acute intraparenchymal hemorrhage in the right frontal lobe, measuring up to 6.0 x 4.8 x 5.3 cm (estimated volume of 76 mL). Small volume of adjacent extra-axial  extension of hemorrhage and moderate surrounding edema. Mass effect on the adjacent frontal horn of the right lateral ventricle with approximately 5 mm of leftward midline shift anteriorly. Acute hemorrhage abuts the lateral ventricle without definite intraventricular extension. Additional acute intraparenchymal hemorrhage in the inferior left cerebellum measuring approximately 1.1 x 2.1 x 1.0 (estimated volume of 1.2 mL). Mild surrounding edema without significant mass effect. No evidence of acute large vascular territory infarct. No evidence of hydrocephalus. Basal cisterns are patent. Moderate patchy white matter hypoattenuation, nonspecific but compatible with chronic microvascular ischemic disease. Mild for age atrophy. Vascular: Calcific intracranial atherosclerosis. No hyperdense vessel identified. Skull: No acute fracture. Sinuses/Orbits: Visualized sinuses are clear. Other: No mastoid effusions. IMPRESSION: 1. Large acute intraparenchymal hemorrhage in the right frontal lobe, measuring up to 6.0 x 4.8 x 5.3 cm (estimated volume of 76 mL). Small volume of adjacent extra-axial extension of hemorrhage and moderate surrounding edema. Mass effect on the adjacent frontal horn of the right lateral ventricle with approximately 5 mm of leftward midline shift anteriorly. 2. Smaller acute intraparenchymal hemorrhage in the inferior left cerebellum measuring approximately 1.1 x 2.1 x 1.0 (estimated volume of 1.2 mL). Mild surrounding edema without significant mass effect. 3. The location of these hemorrhages is somewhat atypical for hypertensive bleeds or hemorrhagic conversion of infarcts. Recommend correlation with any history of trauma or anticoagulation. Amyloid angiopathy is a consideration, particularly given patient age. While no obvious mass was visible on the prior CT head from August 19, 2020, consider follow-up MRI with contrast after resolution of acute hemorrhage to exclude underlying masses. 4. Moderate  chronic microvascular ischemic disease and mild atrophy. Findings discussed with Dr. Armandina Gemma via telephone at 11:34 AM. Electronically Signed   By: Margaretha Sheffield M.D.   On: 02/16/2021 11:50   DG Chest Port 1 View  Result Date: 02/16/2021 CLINICAL DATA:  Altered mental status. EXAM: PORTABLE CHEST 1 VIEW COMPARISON:  08/19/2020 FINDINGS: Normal sized heart. Clear lungs. Diffuse osteopenia. Mild to moderate thoracolumbar scoliosis. Cholecystectomy clips. IMPRESSION: No acute abnormality. Electronically Signed   By: Claudie Revering M.D.   On: 02/16/2021 13:18    PHYSICAL EXAM  Mental Status: Awake and alert but with left sided neglect and abulia. Able to name her self but nothing else. Repeated "pretty girl" to examiner. Speech is hypophonic and sparse, but with intact grammar and syntax in the context of short phrases. No dysarthria. Did not attempt to follow commands. Not oriented to situation, location or time.  Cranial Nerves: II:  Blinks to threat on the right but not on the left. PERRL.  III,IV, VI: No ptosis. Right gaze deviation  Does not gaze fully to the left. No nystagmus.  V: Intact temp sensation on the right   VII: Mild left facial droop.  VIII: Hearing intact to commands given advanced age  IX,X: Hypophonic speech XI: Head is preferentially rotated to the right.  XII: Did not protrude tongue to command.  Motor: RUE: Squeezes examiner's hand, flexes at elbow and extends at elbow with 4/5 strength, but requires coaching. Laying preferentially on right side and does not elevate at shoulder.  LUE: Flaccid with no movement to command or any stimuli RLE: unable to assess at this time.   LLE: unable to assess at this time.  Sensory: unable to asses  Cerebellar: Does not follow commands for testing.  Gait: Unable to assess    ASSESSMENT/PLAN Laura Colon is a 87 y.o. female with history of Alzheimer's dementia, arthritis, and history of left breast cancer s/p mastectomy  who presented to the [facility] ED this morning with AMS and fever starting a day and a half PTA. Patient was in her [place] until Friday, when she started to have hiccups. On Saturday morning she was noted to be altered and later became nonverbal. EMS was called and she was brought in to the [facility] ED for evaluation. GCS was 12 on arrival. She was moving all 4 extremities to pain. She was mildly hypertensive. Temperature 99.5 via tympanic thermometer.   CT head revealed a large early subacute right frontal lobe ICH. This was discussed with the patient's daughter, who made the determination that it would be in her best interest to be DNR/DNI.   Daughter at bedside made aware of prognosis given patient's advanced age, medical issues including advanced alzheimer's dementia. She agrees to consult palliative care for hospice measures.   Stroke. Large right frontal lobe hemorrhagic:  bilateral hemorrhagic infarct   CT head: large right frontal IPH. With 32m right to left shift at time of admission, and left cerebellar hemorrhage, new since admission  Had conversation with patient's daughter, Laura Colon at bedside regarding plan of care/withdraw of care. Daughter appeared to be interested in conversation with palliative care/ hospice management. We will facilitate.   VTE prophylaxis - SCD    Diet   Diet NPO time specified   aspirin 81 mg daily prior to admission, now on No antithrombotic. IPH Therapy recommendations:  pending Disposition:  palliative care consult placed   CBGs Recent Labs    02/16/21 1155 02/16/21 2022  GLUCAP 106* 98    SSI  Other Stroke Risk Factors  Advanced Age >/= 680       Hx stroke/TIA  Other Active Problems  Alzheimer's disease   Hospital day # 1   I have personally obtained history,examined this patient, reviewed notes, independently viewed imaging studies, participated in medical decision making and plan of care.ROS completed by me personally and pertinent positives  fully documented  I have made any additions or clarifications directly to the above note. Agree with note above.  Patient has presented with a large right frontal parenchymal intracerebral hemorrhage and repeat imaging shows an additional small left cerebral hemorrhage as well.  Etiology is indeterminate but possibly amyloid angiopathy versus hypertensive.  Continue close blood pressure and neurological monitoring.  Patient prognosis is quite poor and she is unlikely to survive this and make meaningful improvement and likely need 24-hour care in the nursing home setting.  Patient's daughter is quite clear that patient would not want this and agrees to DNR and is willing to discuss with palliative care team goals of care.  Long discussion patient's daughter and answered questions. This patient is critically ill and at significant risk of neurological worsening, death and care requires constant monitoring of vital signs, hemodynamics,respiratory and cardiac monitoring, extensive review of multiple databases, frequent neurological assessment, discussion with family, other specialists and medical decision making of high complexity.I have made any additions or clarifications directly to the above note.This critical care time does not reflect procedure time, or teaching time or supervisory time of PA/NP/Med Resident etc but could involve care discussion time.  I spent 30 minutes of neurocritical care time  in the care of  this patient.  Antony Contras, MD Medical Director [facility] Stroke Center Pager: [phone] 02/17/2021 5:43 PM   To contact Stroke Continuity provider, please refer to [email or web address] After hours, contact General Neurology

## 2021-02-18 DIAGNOSIS — Z7189 Other specified counseling: Secondary | ICD-10-CM

## 2021-02-18 LAB — TRIGLYCERIDES: Triglycerides: 67 mg/dL (ref <150)

## 2021-02-18 LAB — URINALYSIS, COMPLETE (UACMP) WITH MICROSCOPIC
Bacteria, UA: NONE SEEN
Bilirubin Urine: NEGATIVE
Glucose, UA: NEGATIVE mg/dL
Ketones, ur: 20 mg/dL — AB
Leukocytes,Ua: NEGATIVE
Nitrite: NEGATIVE
Protein, ur: NEGATIVE mg/dL
Specific Gravity, Urine: 1.014 (ref 1.005–1.030)
pH: 5 (ref 5.0–8.0)

## 2021-02-18 LAB — ECHOCARDIOGRAM COMPLETE
AR max vel: 1.52 cm2
AV Area VTI: 1.52 cm2
AV Area mean vel: 1.25 cm2
AV Mean grad: 6 mmHg
AV Peak grad: 9.2 mmHg
Ao pk vel: 1.52 m/s
Area-P 1/2: 2.68 cm2
Height: 60 in
S' Lateral: 2.2 cm
Weight: 1760 oz

## 2021-02-18 NOTE — Progress Notes (Signed)
Daily Progress Note   Patient Name: Laura Colon       Date: 02/18/2021 DOB: 06-Feb-1924  Age: 87 y.o. MRN#: [id number] Attending Physician: Kerney Elbe, MD Primary Care Physician: Kelton Pillar, MD Admit Date: 02/16/2021  Reason for Consultation/Follow-up: Establishing goals of care  Patient Profile/HPI:  87 y.o. female with past medical history of alzheimer's dementia, breast cancer s/p mastectomy, dysphagia, and syncope, who was admitted on 02/16/2021 with left sided weakness.  She was found on imaging to have two intracerebral hemorrhages - One large right sided hemorrhage (4.7 x 5.2 x 6.0 cm) an one in the left cerebellum (2.0 cm).   Patient's troponin, lactic acid and d-dimer were elevated on admission.  Creatinine is 0.70 and albumin is 3.8.   On my initial exam patient is sitting up working with speech therapy and eating puree.  Subjective: Laura Colon is showing some improvements today.  Met with daughter and son.  We discussed options going forward - PT, SNF vs comfort and hospice. We discussed the fact that her dementia is likely worsened due to the stroke.  Goals of care were discussed. Encouraged daughter to consider how patient would prefer to spend her time if she knew that her amount of lifetime was limited to possibly months.  [name] expressed that she would prefer to take her Mom home- she knows that noone can care for her Mom the way that she does.  Hospice services were reviewed. [name] and Dominica Severin also note that patient has some resources that could be used to pay for in home care. [name] is tired and right now needs to rest before making any major decisions.      Physical Exam Vitals and nursing note reviewed.  Pulmonary:     Effort: Pulmonary effort is normal.   Skin:    General: Skin is warm and dry.  Neurological:     Mental Status: She is alert. She is disoriented.     Motor: Weakness present.            Vital Signs: BP (!) 157/82   Pulse 67   Temp 97.9 F (36.6 C) (Axillary)   Resp 18   Ht 5' (1.524 m)   Wt 49.9 kg   SpO2 96%   BMI 21.48 kg/m  SpO2: SpO2: 96 % O2 Device:  O2 Device: Room Air O2 Flow Rate:    Intake/output summary:  Intake/Output Summary (Last 24 hours) at 02/18/2021 1205 Last data filed at 02/18/2021 1000 Gross per 24 hour  Intake 1106.67 ml  Output 600 ml  Net 506.67 ml   LBM: Last BM Date:  (PTA) Baseline Weight: Weight: 49.9 kg Most recent weight: Weight: 49.9 kg       Palliative Assessment/Data: PPS: 30%      Patient Active Problem List   Diagnosis Date Noted   ICH (intracerebral hemorrhage) ([place]) 02/16/2021   Malnutrition ([place]) 03/21/2012   Inadequate oral intake 03/21/2012   Constipation 03/20/2012   Abdominal pain 03/19/2012   Hypotension 03/18/2012   Hypokalemia 03/18/2012   Weakness generalized 03/18/2012   Breast mass, right 03/18/2012   Near syncope 03/17/2012   Dementia [facility])     Palliative Care Assessment & Plan    Assessment/Recommendations/Plan  Continue current scope of care [name] and Dominica Severin are considering Goals of care and options for care after hospitalization   Code Status: DNR  Prognosis:  < 6 months likely due to new stroke in the setting of advanced dementia  Discharge Planning: To Be Determined  Care plan was discussed with patient's family- [name] and Dominica Severin.  Thank you for allowing the Palliative Medicine Team to assist in the care of this patient.  Total time:  42 mins Prolonged billing: No     Greater than 50%  of this time was spent counseling and coordinating care related to the above assessment and plan.  Mariana Kaufman, AGNP-C Palliative Medicine   Please contact Palliative Medicine Team phone at [phone] for questions and concerns.

## 2021-02-18 NOTE — Progress Notes (Addendum)
STROKE TEAM PROGRESS NOTE   INTERVAL HISTORY Patient is doing slightly better today.  She remains alert and interactive but has left hemiparesis and some neglect.  She has passed swallow eval.  Blood pressure adequately controlled. Patient's daughter agrees to DNR but is not ready to make her full comfort care yet and would like to give her a few days to see if if she gets even better.  Vital signs stable.   Long discussion patient's daughter and answered questions. Vitals:   02/18/21 0729 02/18/21 0800 02/18/21 0854 02/18/21 0900  BP:  (!) 143/86 106/70 136/67  Pulse:  75 88 86  Resp:  (!) 22 (!) 26 (!) 25  Temp: 98.3 F (36.8 C)     TempSrc: Axillary     SpO2:  97% 95% 93%  Weight:      Height:       CBC:  Recent Labs  Lab 02/16/21 1112  WBC 11.0*  NEUTROABS 9.6*  HGB 12.4  HCT 38.4  MCV 91.4  PLT 0000000    Basic Metabolic Panel:  Recent Labs  Lab 02/16/21 1112  NA 141  K 3.6  CL 105  CO2 27  GLUCOSE 120*  BUN 19  CREATININE 0.70  CALCIUM 9.0     Lipid Panel:  Recent Labs  Lab 02/18/21 0423  TRIG 67    HgbA1c: No results for input(s): HGBA1C in the last 168 hours. Urine Drug Screen: No results for input(s): LABOPIA, COCAINSCRNUR, LABBENZ, AMPHETMU, THCU, LABBARB in the last 168 hours.  Alcohol Level No results for input(s): ETH in the last 168 hours.  IMAGING past 24 hours No results found.  PHYSICAL EXAM  Mental Status: Awake and alert but with dysarthric and slightly nonfluent speech she Is improved from yesterday's exam. Speech is hypophonic and sparse, but with intact grammar and syntax in the context of short phrases. .  She was able to recognize and name her daughter but she is Not oriented to situation, location or time.  Cranial Nerves: II:  Blinks to threat on the right but not on the left. PERRL.  III,IV, VI: No ptosis. Right gaze deviation  Does not gaze fully to the left. No nystagmus.  V: Intact temp sensation on the right   VII: Mild left  facial droop.  VIII: Hearing intact to commands given advanced age  IX,X: Hypophonic speech  XII: Did protrude tongue to command.  Motor: RUE: Squeezes examiner's hand at right, flexes at elbow and extends at elbow with 4/5 strength, but requires coaching.   LUE: holds left hand up antigravity with coaching.  RLE: moves toes but leg falls quickly down to bed LLE: moves toes and leg slightly off the bed.  Sensory: unable to asses  Cerebellar: Does not follow commands for testing.  Gait: Unable to assess    ASSESSMENT/PLAN Laura Colon is a 87 y.o. female with history of Alzheimer's dementia, arthritis, and history of left breast cancer s/p mastectomy who presented to the [facility] ED this morning with AMS and fever starting a day and a half PTA. Patient was in her [place] until Friday, when she started to have hiccups. On Saturday morning she was noted to be altered and later became nonverbal. EMS was called and she was brought in to the [facility] ED for evaluation. GCS was 12 on arrival. She was moving all 4 extremities to pain. She was mildly hypertensive. Temperature 99.5 via tympanic thermometer.   CT head revealed a large  early subacute right frontal lobe ICH. This was discussed with the patient's daughter, who made the determination that it would be in her best interest to be DNR/DNI.   Daughter at bedside made aware of prognosis given patient's advanced age, medical issues including advanced alzheimer's dementia. She agrees to consult palliative care for hospice measures.   Stroke. Large right frontal lobe hemorrhagic:  bilateral hemorrhagic infarct   CT head: large right frontal IPH. With 22m right to left shift at time of admission, and left cerebellar hemorrhage, new since admission  Had conversation with patient's daughter, KSantiago Glad at bedside regarding plan of care/withdraw of care. Today she would like to consider SNF vs acute rehab.  VTE prophylaxis - SCD    Diet   Diet  regular Room service appropriate? Yes; Fluid consistency: Thin   aspirin 81 mg daily prior to admission, now on No antithrombotic. IPH Therapy recommendations: SNF disposition:  pending.   CBGs Recent Labs    02/16/21 1155 02/16/21 2022  GLUCAP 106* 98     SSI  Other Stroke Risk Factors  Advanced Age >/= 673  Hx stroke/TIA  Other Active Problems  Alzheimer's disease   Hospital day # 2   I have personally obtained history,examined this patient, reviewed notes, independently viewed imaging studies, participated in medical decision making and plan of care.ROS completed by me personally and pertinent positives fully documented  I have made any additions or clarifications directly to the above note. Agree with note above.  Continue mobilization out of bed and ongoing therapies.  Continue strict control of hypertension blood pressure goal below 160.  Transfer to neurology floor bed later today if available.  She will likely need skilled nursing facility for rehab.  Patient daughter is realistic and agrees to DNR would like to support her if she continues to improve but if she gets worse she may reconsider comfort care.This patient is critically ill and at significant risk of neurological worsening, death and care requires constant monitoring of vital signs, hemodynamics,respiratory and cardiac monitoring, extensive review of multiple databases, frequent neurological assessment, discussion with family, other specialists and medical decision making of high complexity.I have made any additions or clarifications directly to the above note.This critical care time does not reflect procedure time, or teaching time or supervisory time of PA/NP/Med Resident etc but could involve care discussion time.  I spent 30 minutes of neurocritical care time  in the care of  this patient.      PAntony Contras MD Medical Director MFlandersPager: [phone]/13/2022 2:20 PM   To contact  Stroke Continuity provider, please refer to [email or web address] After hours, contact General Neurology

## 2021-02-18 NOTE — NC FL2 (Signed)
[place] LEVEL OF CARE SCREENING TOOL     IDENTIFICATION  Patient Name: Laura Colon Birthdate: July 17, 1923 Sex: female Admission Date (Current Location): 02/16/2021  [facility] and [place] Number:  Herbalist and Address:  [facility], [place] [address] [place], [place] [address]      Provider Number: [id number]  Attending Physician Name and Address:  Kerney Elbe, MD  Relative Name and Phone Number:       Current Level of Care: Hospital Recommended Level of Care: [place] Prior Approval Number:    Date Approved/Denied:   PASRR Number: [id number]  Discharge Plan: SNF    Current Diagnoses: Patient Active Problem List   Diagnosis Date Noted   ICH (intracerebral hemorrhage) ([place]) 02/16/2021   Malnutrition ([place]) 03/21/2012   Inadequate oral intake 03/21/2012   Constipation 03/20/2012   Abdominal pain 03/19/2012   Hypotension 03/18/2012   Hypokalemia 03/18/2012   Weakness generalized 03/18/2012   Breast mass, right 03/18/2012   Near syncope 03/17/2012   Dementia ([place])     Orientation RESPIRATION BLADDER Height & Weight     Self  Normal Continent, External catheter Weight: 110 lb (49.9 kg) Height:  5' (152.4 cm)  BEHAVIORAL SYMPTOMS/MOOD NEUROLOGICAL BOWEL NUTRITION STATUS      Continent Diet (see dc summary)  AMBULATORY STATUS COMMUNICATION OF NEEDS Skin   Extensive Assist Verbally Normal                       Personal Care Assistance Level of Assistance  Bathing, Feeding, Dressing Bathing Assistance: Maximum assistance Feeding assistance: Limited assistance Dressing Assistance: Limited assistance     Functional Limitations Info  Sight Sight Info: Impaired        SPECIAL CARE FACTORS FREQUENCY  PT (By licensed PT), OT (By licensed OT)     PT Frequency: 5x/week OT Frequency: 5x/week            Contractures Contractures Info: Not present    Additional Factors Info   Code Status, Allergies Code Status Info: DNR Allergies Info: NKA           Current Medications (02/18/2021):  This is the current hospital active medication list Current Facility-Administered Medications  Medication Dose Route Frequency Provider Last Rate Last Admin    stroke: mapping our early stages of recovery book   Does not apply Once Kerney Elbe, MD       0.9 %  sodium chloride infusion   Intravenous Continuous Garvin Fila, MD 50 mL/hr at 02/18/21 0739 New Bag at 02/18/21 0739   acetaminophen (TYLENOL) tablet 650 mg  650 mg Oral Q4H PRN Kerney Elbe, MD       Or   acetaminophen (TYLENOL) 160 MG/5ML solution 650 mg  650 mg Per Tube Q4H PRN Kerney Elbe, MD       Or   acetaminophen (TYLENOL) suppository 650 mg  650 mg Rectal Q4H PRN Kerney Elbe, MD       chlorhexidine (PERIDEX) 0.12 % solution 15 mL  15 mL Mouth Rinse BID Kerney Elbe, MD   15 mL at 02/17/21 2308   Chlorhexidine Gluconate Cloth 2 % PADS 6 each  6 each Topical Daily Kerney Elbe, MD   6 each at 02/17/21 1545   clevidipine (CLEVIPREX) infusion 0.5 mg/mL  0-21 mg/hr Intravenous Continuous Kerney Elbe, MD   Stopped at 02/18/21 0059   MEDLINE mouth rinse  15 mL Mouth Rinse q12n4p Lind[place],  Randall Hiss, MD   15 mL at 02/17/21 1602   pantoprazole (PROTONIX) injection 40 mg  40 mg Intravenous QHS Kerney Elbe, MD   40 mg at 02/17/21 2308   senna-docusate (Senokot-S) tablet 1 tablet  1 tablet Oral BID Kerney Elbe, MD         Discharge Medications: Please see discharge summary for a list of discharge medications.  Relevant Imaging Results:  Relevant Lab Results:   Additional Information SSN: [id number]. Not vaccinated for [place], LCSW

## 2021-02-18 NOTE — Evaluation (Signed)
Occupational Therapy Evaluation Patient Details Name: Laura Colon MRN: [id number] DOB: 10-Jan-1924 Today's Date: 02/18/2021   History of Present Illness Laura Colon is an 87 y.o. female with Alzheimer's dementia, arthritis, and history of left breast cancer s/p mastectomy admitted with AMS progressing to nonverbal.  CT head revealed large early subacute R frontal ICH.   Clinical Impression   PT admitted with R frontal ICH. Pt currently with functional limitiations due to the deficits listed below (see OT problem list). Pt responds hello back to therapist on entry but otherwise does not speak. Pt requires total (A) to complete bed mobility due to sequence. Pt in a unfamiliar environment and needs (A) to do all adls. Pt needs (A) to fall meal and should be a on a feeder schedule with staff to ensure meals are fed to patient.  Pt will benefit from skilled OT to increase their independence and safety with adls and balance to allow discharge home with family (A) per son at this time.       Recommendations for follow up therapy are one component of a multi-disciplinary discharge planning process, led by the attending physician.  Recommendations may be updated based on patient status, additional functional criteria and insurance authorization.   Follow Up Recommendations  Home health OT    Equipment Recommendations  Wheelchair (measurements OT);Wheelchair cushion (measurements OT)    Recommendations for Other Services       Precautions / Restrictions Precautions Precautions: Fall Precaution Comments: L neglect      Mobility Bed Mobility Overal bed mobility: Needs Assistance Bed Mobility: Supine to Sit;Sit to Supine     Supine to sit: Max assist Sit to supine: Total assist   General bed mobility comments: pt requires (A) to progrress to L side of the bed and sustain static sitting max (A). pt progress to supine with total (A) with elevation of bil Le. pt does attempt  to bridge buttock for pad adjustment    Transfers Overall transfer level: Needs assistance Equipment used: Rolling walker (2 wheeled) Transfers: Sit to/from [facility] Sit to Stand: Mod assist              Balance           Standing balance support: Bilateral upper extremity supported Standing balance-Leahy Scale: Poor Standing balance comment: reliant bil UE                           ADL either performed or assessed with clinical judgement   ADL Overall ADL's : Needs assistance/impaired Eating/Feeding: Total assistance Eating/Feeding Details (indicate cue type and reason): lunch present and not showing any interest to initiate Grooming: Total assistance   Upper Body Bathing: Total assistance   Lower Body Bathing: Total assistance   Upper Body Dressing : Total assistance   Lower Body Dressing: Total assistance   Toilet Transfer: Moderate assistance;RW;Regular Museum/gallery exhibitions officer and Hygiene: Total assistance       Functional mobility during ADLs: Moderate assistance;Rolling walker       Vision   Additional Comments: using central vision and not scanning L     Perception     Praxis      Pertinent Vitals/Pain Pain Assessment: No/denies pain     Hand Dominance Right   Extremity/Trunk Assessment Upper Extremity Assessment LUE Deficits / Details: using with RW this session.   Lower Extremity Assessment Lower Extremity Assessment: Generalized weakness   Cervical /  Trunk Assessment Cervical / Trunk Assessment: Kyphotic   Communication Communication Communication: HOH   Cognition Arousal/Alertness: Awake/alert Behavior During Therapy: WFL for tasks assessed/performed Overall Cognitive Status: History of cognitive impairments - at baseline                                 General Comments: son present. pt startles easily with movement and less attention to the L side   General  Comments  VSS    Exercises     Shoulder Instructions      Home Living Family/patient expects to be discharged to:: Private residence Living Arrangements: Children Available Help at Discharge: Family;Available 24 hours/day Type of Home: House Home Access: Stairs to enter [employer] of Steps: 1 (back door and drive up with car on driveway. can have someone make a ramp per brother) Entrance Stairs-Rails: None Home Layout: One level     Bathroom Shower/Tub: Teacher, early years/pre: Standard Bathroom Accessibility: Yes How Accessible: Accessible via walker Home Equipment: [place] - 2 wheels;Cane - single point;Cane - quad;Grab bars - tub/shower   Additional Comments: lives with daughter and brother gives a few hours (A) on Sundays to relief his sister. Brother reports that having paid (A) tech      Prior Functioning/Environment Level of Independence: Needs assistance  Gait / Transfers Assistance Needed: using walker since March when had acute progression of Alzheimer's versus TIA per daughter ADL's / Homemaking Assistance Needed: daughter assists using wipes since pt with water aversion            OT Problem List: Decreased strength;Decreased activity tolerance;Impaired balance (sitting and/or standing);Decreased coordination;Decreased cognition;Decreased safety awareness;Decreased knowledge of use of DME or AE;Decreased knowledge of precautions      OT Treatment/Interventions: Self-care/ADL training;Therapeutic exercise;DME and/or AE instruction;Therapeutic activities;Cognitive remediation/compensation;Patient/family education;Balance training    OT Goals(Current goals can be found in the care plan section) Acute Rehab OT Goals Patient Stated Goal: to be able to care for her per son. Son just meeting with palliative services OT Goal Formulation: With family Time For Goal Achievement: 03/04/21 Potential to Achieve Goals: Good  OT Frequency: Min  2X/week   Barriers to D/C:            Co-evaluation              AM-PAC OT "6 Clicks" Daily Activity     Outcome Measure Help from another person eating meals?: Total Help from another person taking care of personal grooming?: Total Help from another person toileting, which includes using toliet, bedpan, or urinal?: Total Help from another person bathing (including washing, rinsing, drying)?: Total Help from another person to put on and taking off regular upper body clothing?: Total Help from another person to put on and taking off regular lower body clothing?: Total 6 Click Score: 6   End of Session Equipment Utilized During Treatment: Rolling walker;Gait belt Nurse Communication: Mobility status;Precautions  Activity Tolerance: Patient tolerated treatment well Patient left: in bed;with call bell/phone within reach;with bed alarm set;with family/visitor present;with nursing/sitter in room  OT Visit Diagnosis: Unsteadiness on feet (R26.81);Muscle weakness (generalized) (M62.81)                Time: [id number] OT Time Calculation (min): 21 min Charges:  OT General Charges $OT Visit: 1 Visit OT Evaluation $OT Eval Moderate Complexity: 1 Mod   Brynn, OTR/L  Acute Rehabilitation Services Pager: [phone] Office: [phone] .  Jeri Modena 02/18/2021, 1:49 PM

## 2021-02-18 NOTE — Progress Notes (Signed)
Dr. Leonie Man verified new SBP goal is <160.

## 2021-02-19 DIAGNOSIS — I619 Nontraumatic intracerebral hemorrhage, unspecified: Principal | ICD-10-CM

## 2021-02-19 DIAGNOSIS — F0390 Unspecified dementia without behavioral disturbance: Secondary | ICD-10-CM

## 2021-02-19 DIAGNOSIS — R54 Age-related physical debility: Secondary | ICD-10-CM

## 2021-02-19 LAB — URINE CULTURE: Culture: NO GROWTH

## 2021-02-19 MED ORDER — AMLODIPINE BESYLATE 5 MG PO TABS
5.0000 mg | ORAL_TABLET | Freq: Every day | ORAL | Status: DC
  Administered 2021-02-19 – 2021-02-20 (×2): 5 mg via ORAL
  Filled 2021-02-19 (×2): qty 1

## 2021-02-19 NOTE — Care Management Important Message (Signed)
Important Message  Patient Details  Name: Laura Colon MRN: [id number] Date of Birth: May 09, 1924   Medicare Important Message Given:  Yes     Aramis Zobel Montine Circle 02/19/2021, 3:45 PM

## 2021-02-19 NOTE — Progress Notes (Signed)
Occupational Therapy Treatment Patient Details Name: Laura Colon MRN: [id number] DOB: August 19, 1923 Today's Date: 02/19/2021   History of present illness Laura Colon is an 87 y.o. female with Alzheimer's dementia, arthritis, and history of left breast cancer s/p mastectomy admitted with AMS progressing to nonverbal.  CT head revealed large early subacute R frontal ICH.   OT comments  Pt received in bed with her daughter, [name], present. Session focused on caregiver education regarding pressure area prevention, safe assistance with mobility, and proper use of DME. Therapists provided 1:1 education, visual demonstration and hands on assistance with stand-pivot transfers and sit<>stand transfers. Pt demonstrated ability to safely assist pt with face to face stand pivot transfer from w/c to EOB. Educated focused on use of gait belt, fall prevention, safe body mechanics, activity progression, pressure area prevention safety with transfers and aspiration precautions. OT will continue to follow for caregiver education and progression with ADL completion and functional mobility for the pt.    Recommendations for follow up therapy are one component of a multi-disciplinary discharge planning process, led by the attending physician.  Recommendations may be updated based on patient status, additional functional criteria and insurance authorization.    Follow Up Recommendations  Supervision/Assistance - 24 hour (home with hospice)    Equipment Recommendations  Wheelchair (measurements OT);Wheelchair cushion (measurements OT);3 in 1 bedside commode;Hospital bed    Recommendations for Other Services      Precautions / Restrictions Precautions Precautions: Fall Precaution Comments: L neglect, SBP <160, no BP on LUE (hx L mastectomy) Restrictions Weight Bearing Restrictions: No       Mobility Bed Mobility Overal bed mobility: Needs Assistance Bed Mobility: Supine to Sit;Rolling;Sit to  Sidelying Rolling: Mod assist   Supine to sit: Max assist   Sit to sidelying: Mod assist General bed mobility comments: pt requires (A) to progress to L side of the bed and sustain static sitting (able to progress to min guard/brief close Supervision while static sitting) and pt able to lean forward 2-3" outside BOS to apply lotion to knees/calves without LOB    Transfers Overall transfer level: Needs assistance Equipment used: Rolling walker (2 wheeled);None (face to face transfer) Transfers: Sit to/from [employer] Transfers Sit to Stand: Mod assist Stand pivot transfers: Mod assist;+2 physical assistance;+2 safety/equipment Squat pivot transfers: Max assist     General transfer comment: assist to stand to walker for balance and safety with some posterior bias, to step to wheelchair pt needed modA +2 with RW for balance and sequencing, increased time to perform. OT gave demo for pt daughter on stand pivot transfer to/from bed with caregiver also demoing transfer prior to having her perform with pt. Daughter [name] present assisted pt to perform stand pivot transfer from wheelchair>bed with therapist instruction on safety with body mechanics and guarding for safety. PTA also then demonstrated squat pivot to assist pt to scoot higher toward [facility] with transfer pad and pt arms around therapist shoulders/upper back.    Balance Overall balance assessment: Needs assistance   Sitting balance-Leahy Scale: Fair Sitting balance - Comments: min guard to Supervision with 1-2 UE support   Standing balance support: Bilateral upper extremity supported Standing balance-Leahy Scale: Poor Standing balance comment: reliant bil UE, posterior lean throughout standing                           ADL either performed or assessed with clinical judgement   ADL Overall ADL's :  Needs assistance/impaired                                     Functional  mobility during ADLs: Moderate assistance;Rolling walker General ADL Comments: moderate assistance for UB ADL;max to total assistance for LB ADL; pt required modA for stand pivot from EOB to w/c     Vision       Perception     Praxis      Cognition Arousal/Alertness: Awake/alert Behavior During Therapy: WFL for tasks assessed/performed Overall Cognitive Status: History of cognitive impairments - at baseline                                 General Comments: Daughter [name] present for caregiver training. Pt startles easily with movement and less attention to the L side but will look to L with cues (too look out the window, etc). Multimodal cues and increased time for simple mobility tasks, pt needs repetition of most cues.        Exercises     Shoulder Instructions       General Comments HR 79-95 bpm with exertion; SpO2 96% on RA. Pt denies dizziness with transfers today.    Pertinent Vitals/ Pain       Pain Assessment: Faces Faces Pain Scale: Hurts a little bit Pain Location: RLE (heel?) pt having difficulty localizing and R forearm from hospital bracelets on skin Pain Descriptors / Indicators: Discomfort;Grimacing Pain Intervention(s): Limited activity within patient's tolerance;Monitored during session;Repositioned  Home Living                                          Prior Functioning/Environment              Frequency  Min 2X/week        Progress Toward Goals  OT Goals(current goals can now be found in the care plan section)  Progress towards OT goals: Progressing toward goals  Acute Rehab OT Goals Patient Stated Goal: to be able to care for her per family (daughter [name]) OT Goal Formulation: With family Time For Goal Achievement: 03/04/21 Potential to Achieve Goals: Fair ADL Goals Pt Will Perform Eating: with max assist;sitting Pt Will Perform Grooming: with max assist;sitting Additional ADL Goal #1: pt will  initiate bed mobility with multimodal cues Additional ADL Goal #2: pt will static sit min (A) as precursor to transfer  Plan Discharge plan needs to be updated    Co-evaluation      Reason for Co-Treatment: Complexity of the patient's impairments (multi-system involvement);Necessary to address cognition/behavior during functional activity;For patient/therapist safety;To address functional/ADL transfers PT goals addressed during session: Mobility/safety with mobility;Balance;Proper use of DME;Strengthening/ROM        AM-PAC OT "6 Clicks" Daily Activity     Outcome Measure   Help from another person eating meals?: A Lot Help from another person taking care of personal grooming?: A Lot Help from another person toileting, which includes using toliet, bedpan, or urinal?: A Lot Help from another person bathing (including washing, rinsing, drying)?: A Lot Help from another person to put on and taking off regular upper body clothing?: A Lot Help from another person to put on and taking off regular lower body clothing?: A Lot 6 Click  Score: 12    End of Session Equipment Utilized During Treatment: Rolling walker;Gait belt  OT Visit Diagnosis: Unsteadiness on feet (R26.81);Muscle weakness (generalized) (M62.81)   Activity Tolerance Patient tolerated treatment well   Patient Left in bed;with call bell/phone within reach;with bed alarm set;with family/visitor present;with restraints reapplied   Nurse Communication Mobility status        Time: [id number] OT Time Calculation (min): 57 min  Charges: OT General Charges $OT Visit: 1 Visit OT Treatments $Self Care/Home Management : 38-52 mins  Helene Kelp OTR/L Acute Rehabilitation Services Office: [place] 02/19/2021, 2:55 PM

## 2021-02-19 NOTE — Progress Notes (Signed)
STROKE TEAM PROGRESS NOTE   INTERVAL HISTORY Patient is lying comfortably in bed.  Her daughter is at the bedside..  She remains alert and interactive but has l mild eft hemiparesis and some neglect.  She has passed swallow eval.  Blood pressure adequately controlled. Patient's daughter now agrees to DNR and after reviewing her living will agrees to full comfort care yet and would like to take her home with home hospice.  Vital signs stable.   Long discussion patient's daughter and answered questions. Vitals:   02/19/21 0400 02/19/21 0651 02/19/21 0909 02/19/21 1157  BP: (!) 166/72 (!) 159/90 (!) 159/83 (!) 151/95  Pulse: 72 72 75 74  Resp: '18  14 15  '$ Temp: 97.7 F (36.5 C)  98.4 F (36.9 C) 98.2 F (36.8 C)  TempSrc: Oral  Oral Oral  SpO2: 95%  98% 98%  Weight:      Height:       CBC:  Recent Labs  Lab 02/16/21 1112  WBC 11.0*  NEUTROABS 9.6*  HGB 12.4  HCT 38.4  MCV 91.4  PLT 0000000   Basic Metabolic Panel:  Recent Labs  Lab 02/16/21 1112  NA 141  K 3.6  CL 105  CO2 27  GLUCOSE 120*  BUN 19  CREATININE 0.70  CALCIUM 9.0    Lipid Panel:  Recent Labs  Lab 02/18/21 0423  TRIG 67    HgbA1c: No results for input(s): HGBA1C in the last 168 hours. Urine Drug Screen: No results for input(s): LABOPIA, COCAINSCRNUR, LABBENZ, AMPHETMU, THCU, LABBARB in the last 168 hours.  Alcohol Level No results for input(s): ETH in the last 168 hours.  IMAGING past 24 hours No results found.  PHYSICAL EXAM  Mental Status: Awake and alert but with dysarthric and slightly nonfluent speech she Is improved from yesterday's exam. Speech is hypophonic and sparse, but with intact grammar and syntax in the context of short phrases. .  She was able to recognize and name her daughter but she is Not oriented to situation, location or time.  Cranial Nerves: II:  Blinks to threat on the right but not on the left. PERRL.  III,IV, VI: No ptosis. Right gaze deviation  Does not gaze fully to the  left. No nystagmus.  V: Intact temp sensation on the right   VII: Mild left facial droop.  VIII: Hearing intact to commands given advanced age  IX,X: Hypophonic speech  XII: Did protrude tongue to command.  Motor: RUE: Squeezes examiner's hand at right, flexes at elbow and extends at elbow with 4/5 strength, but requires coaching.   LUE: holds left hand up antigravity with coaching.  RLE: moves toes but leg falls quickly down to bed LLE: moves toes and leg slightly off the bed.  Sensory: unable to asses  Cerebellar: Does not follow commands for testing.  Gait: Unable to assess    ASSESSMENT/PLAN Ms. MINELA ALKHAFAJI is a 87 y.o. female with history of Alzheimer's dementia, arthritis, and history of left breast cancer s/p mastectomy who presented to the [facility] ED this morning with AMS and fever starting a day and a half PTA. Patient was in her [place] until Friday, when she started to have hiccups. On Saturday morning she was noted to be altered and later became nonverbal. EMS was called and she was brought in to the [facility] ED for evaluation. GCS was 12 on arrival. She was moving all 4 extremities to pain. She was mildly hypertensive. Temperature 99.5 via tympanic  thermometer.   CT head revealed a large early subacute right frontal lobe ICH. This was discussed with the patient's daughter, who made the determination that it would be in her best interest to be DNR/DNI.   Daughter at bedside made aware of prognosis given patient's advanced age, medical issues including advanced alzheimer's dementia. She agrees to consult palliative care for hospice measures.   Stroke. Large right frontal lobe hemorrhagic:  bilateral hemorrhagic infarct   CT head: large right frontal IPH. With 50m right to left shift at time of admission, and left cerebellar hemorrhage, new since admission  Had conversation with patient's daughter, KSantiago Glad at bedside regarding plan of care/withdraw of care. Today she would like  to consider SNF vs acute rehab.  VTE prophylaxis - SCD    Diet   Diet regular Room service appropriate? Yes; Fluid consistency: Thin   aspirin 81 mg daily prior to admission, now on No antithrombotic. IPH Therapy recommendations: SNF disposition:  pending.   CBGs Recent Labs    02/16/21 2022  GLUCAP 98    SSI  Other Stroke Risk Factors  Advanced Age >/= 693  Hx stroke/TIA  Other Active Problems  Alzheimer's disease   Hospital day # 3  Patient continues to show mild improvement but still has significant residual cognitive deficits and right hemiparesis.  Patient daughter is realistic and agrees to DNR would like to transition to full comfort care with home hospice as per patient wishes.  .  I am in agreement with plan.  Palliative care team is involved and making arrangements.  Stroke team will sign off.  Kindly call for questions.  Greater than 50% time during this 25-minute visit was spent in counseling and coordination of care and discussion with care team and patient's daughter.     PAntony Contras MD Medical Director MNorth CatasauquaPager: [phone]/14/2022 1:21 PM   To contact Stroke Continuity provider, please refer to [email or web address] After hours, contact General Neurology

## 2021-02-19 NOTE — Progress Notes (Signed)
Manufacturing engineer [facility]) Hospital Liaison: RN note     Notified by Transition of Care Manger of patient/family request for [facility] services at home after discharge.    Writer spoke with pt's daughter [name] to initiate education related to hospice philosophy, services and team approach to care.  [name] verbalized understanding of information given. [name] also shared many concerns over the steps needed before pt can discharge.   This liaison followed up with bedside visit; [name] was preoccupied with changes she had noticed in pt and unable to plan for discharge or discuss DME needs.  Plan made for [name] to call [facility] liaisons tomorrow. Contact numbers given.    Please send signed and completed DNR form home with patient/family.   Please provide prescriptions for mediations at discharge to ensure comfort until patient can be admitted onto hospice services.   Attempted to discuss DME needs. Patient currently has the following equipment in the home:  walker, gait belt, BSC.  [name] asks to review recommendations from PT before deciding what DME to order.  This liaison encouraged [name] to begin preparing space so the DME can be ordered for delivery tomorrow.  Please do not hesitate to call with questions.   Thank you for the opportunity to participate in this patient's care.  Domenic Moras, BSN, RN       [place] (listed on AMION under [phone]  [phone] (24h on call)

## 2021-02-19 NOTE — Progress Notes (Addendum)
PROGRESS NOTE        PATIENT DETAILS Name: Laura Colon Age: 87 y.o. Sex: female Date of Birth: [birth year] Admit Date: 02/16/2021 Admitting Physician Kerney Elbe, MD [id number], Margaretha Sheffield, MD  Brief Narrative: Patient is a 87 y.o. female with history of dementia, arthritis, history of left breast cancer-presented with left-sided weakness-she was found to have [place] and subsequently admitted to the neurology service.  Upon stability-she was transferred to the [facility] on 9/14.  Subjective: Lying comfortably in bed-pleasantly confused.  Objective: Blood pressure (!) 159/83, pulse 75, temperature 98.4 F (36.9 C), temperature source Oral, resp. rate 14, height 5' (1.524 m), weight 49.9 kg, SpO2 98 %.   Gen Exam: Confused-follows simple commands. HEENT:atraumatic, normocephalic Chest: B/L clear to auscultation anteriorly CVS:S1S2 regular Abdomen:soft non tender, non distended Extremities:no edema Neurology: Difficult to assess-but seems to be moving all 4 extremities. Skin: no rash  Pertinent labs: Na: 141 Creatinine: 0.70 Hemoglobin: 12.4  Assessment/Plan: Large right frontal intraparenchymal hematoma/inferior left cerebellar hemorrhage:Continue supportive care-goal SBP less than 160.Patient evaluated by PT/OT-recommendations of SNF.  HTN: BP remains on the higher side-start low-dose amlodipine and follow.  Alzheimer's disease: Pleasantly confused-continue supportive care.  Palliative care: DNR in place-plans are to continue gentle medical treatment.  Await further recommendations from palliative care.  Procedures : None  Consults: Neurology  DVT Prophylaxis : SCD's Start: 02/16/21 1222  Diet: Diet Order             Diet regular Room service appropriate? Yes; Fluid consistency: Thin  Diet effective now                    Code Status: DNR  Family Communication: None at bedside  Disposition Plan: Status  is: Inpatient  Remains inpatient appropriate because:Inpatient level of care appropriate due to severity of illness  Dispo: The patient is from: Home              Anticipated d/c is to: SNF              Patient currently is not medically stable to d/c.   Difficult to place patient No   Barriers to Discharge: Stroke work-up in progress-optimize BP control-probably SNF/home health on discharge.  Antimicrobial agents: Anti-infectives (From admission, onward)    None        Time spent: 25-minutes-Greater than 50% of this time was spent in counseling, explanation of diagnosis, planning of further management, and coordination of care.  MEDICATIONS: Scheduled Meds:   stroke: mapping our early stages of recovery book   Does not apply Once   chlorhexidine  15 mL Mouth Rinse BID   Chlorhexidine Gluconate Cloth  6 each Topical Daily   mouth rinse  15 mL Mouth Rinse q12n4p   pantoprazole (PROTONIX) IV  40 mg Intravenous QHS   senna-docusate  1 tablet Oral BID   Continuous Infusions:  sodium chloride 50 mL/hr at 02/18/21 2334   PRN Meds:.acetaminophen **OR** acetaminophen (TYLENOL) oral liquid 160 mg/5 mL **OR** acetaminophen  I have personally reviewed following labs and imaging studies  LABORATORY DATA: CBC: Recent Labs  Lab 02/16/21 1112  WBC 11.0*  NEUTROABS 9.6*  HGB 12.4  HCT 38.4  MCV 91.4  PLT 0000000    Basic Metabolic Panel: Recent Labs  Lab 02/16/21 1112  NA 141  K 3.6  CL 105  CO2 27  GLUCOSE 120*  BUN 19  CREATININE 0.70  CALCIUM 9.0    GFR: Estimated Creatinine Clearance: 30.2 mL/min (by C-G formula based on SCr of 0.7 mg/dL).  Liver Function Tests: Recent Labs  Lab 02/16/21 1112  AST 20  ALT 10  ALKPHOS 66  BILITOT 1.2  PROT 7.1  ALBUMIN 3.8   No results for input(s): LIPASE, AMYLASE in the last 168 hours. Recent Labs  Lab 02/16/21 1112  AMMONIA 12    Coagulation Profile: No results for input(s): INR, PROTIME in the last 168  hours.  Cardiac Enzymes: No results for input(s): CKTOTAL, CKMB, CKMBINDEX, TROPONINI in the last 168 hours.  BNP (last 3 results) No results for input(s): PROBNP in the last 8760 hours.  Lipid Profile: Recent Labs    02/18/21 0423  TRIG 67    Thyroid Function Tests: Recent Labs    02/16/21 1112  TSH 2.628    Anemia Panel: No results for input(s): VITAMINB12, FOLATE, FERRITIN, TIBC, IRON, RETICCTPCT in the last 72 hours.  Urine analysis:    Component Value Date/Time   COLORURINE YELLOW 02/18/2021 1014   APPEARANCEUR CLEAR 02/18/2021 1014   LABSPEC 1.014 02/18/2021 1014   PHURINE 5.0 02/18/2021 1014   GLUCOSEU NEGATIVE 02/18/2021 1014   HGBUR MODERATE (A) 02/18/2021 1014   BILIRUBINUR NEGATIVE 02/18/2021 1014   KETONESUR 20 (A) 02/18/2021 1014   PROTEINUR NEGATIVE 02/18/2021 1014   UROBILINOGEN 1.0 03/17/2012 1057   NITRITE NEGATIVE 02/18/2021 1014   LEUKOCYTESUR NEGATIVE 02/18/2021 1014    Sepsis Labs: Lactic Acid, Venous    Component Value Date/Time   LATICACIDVEN 1.9 02/16/2021 1112    MICROBIOLOGY: Recent Results (from the past 240 hour(s))  Blood Cultures (routine x 2)     Status: None (Preliminary result)   Collection Time: 02/16/21 11:00 AM   Specimen: BLOOD  Result Value Ref Range Status   Specimen Description   Final    BLOOD RIGHT ANTECUBITAL Performed at [facility], [place] [address], [place], [place] [address]    Special Requests   Final    BOTTLES DRAWN AEROBIC AND ANAEROBIC Blood Culture adequate volume Performed at [place] [address] [place], [place] [address]    Culture   Final    NO GROWTH 3 DAYS Performed at [facility], [place] [address] [place], [place] [address]    Report Status PENDING  Incomplete  Resp Panel by RT-PCR (Flu A&B, Covid) Nasopharyngeal Swab     Status: None   Collection Time: 02/16/21 11:05 AM   Specimen: Nasopharyngeal Swab; Nasopharyngeal(NP) swabs in vial  transport medium  Result Value Ref Range Status   SARS Coronavirus 2 by RT PCR NEGATIVE NEGATIVE Final    Comment: (NOTE) SARS-CoV-2 target nucleic acids are NOT DETECTED.  The SARS-CoV-2 RNA is generally detectable in upper respiratory specimens during the acute phase of infection. The lowest concentration of SARS-CoV-2 viral copies this assay can detect is 138 copies/mL. A negative result does not preclude SARS-Cov-2 infection and should not be used as the sole basis for treatment or other patient management decisions. A negative result may occur with  improper specimen collection/handling, submission of specimen other than nasopharyngeal swab, presence of viral mutation(s) within the areas targeted by this assay, and inadequate number of viral copies(<138 copies/mL). A negative result must be combined with clinical observations, patient history, and epidemiological information. The expected result is Negative.  Fact Sheet for Patients:  EntrepreneurPulse.com.au  Fact  Sheet for Healthcare Providers:  IncredibleEmployment.be  This test is no t yet approved or cleared by the [place] FDA and  has been authorized for detection and/or diagnosis of SARS-CoV-2 by FDA under an Emergency Use Authorization (EUA). This EUA will remain  in effect (meaning this test can be used) for the duration of the COVID-19 declaration under Section 564(b)(1) of the Act, 21 U.S.C.section 360bbb-3(b)(1), unless the authorization is terminated  or revoked sooner.       Influenza A by PCR NEGATIVE NEGATIVE Final   Influenza B by PCR NEGATIVE NEGATIVE Final    Comment: (NOTE) The Xpert Xpress SARS-CoV-2/FLU/RSV plus assay is intended as an aid in the diagnosis of influenza from Nasopharyngeal swab specimens and should not be used as a sole basis for treatment. Nasal washings and aspirates are unacceptable for Xpert Xpress SARS-CoV-2/FLU/RSV testing.  Fact  Sheet for Patients: EntrepreneurPulse.com.au  Fact Sheet for Healthcare Providers: IncredibleEmployment.be  This test is not yet approved or cleared by the [place] FDA and has been authorized for detection and/or diagnosis of SARS-CoV-2 by FDA under an Emergency Use Authorization (EUA). This EUA will remain in effect (meaning this test can be used) for the duration of the COVID-19 declaration under Section 564(b)(1) of the Act, 21 U.S.C. section 360bbb-3(b)(1), unless the authorization is terminated or revoked.  Performed at [facility], [place] [address] [place], [place] [address]   Blood Cultures (routine x 2)     Status: None (Preliminary result)   Collection Time: 02/16/21 11:12 AM   Specimen: BLOOD  Result Value Ref Range Status   Specimen Description   Final    BLOOD LEFT ANTECUBITAL Performed at [place] [address] [place], [place] [address]    Special Requests   Final    BOTTLES DRAWN AEROBIC AND ANAEROBIC Blood Culture results may not be optimal due to an inadequate volume of blood received in culture bottles Performed at [place] [address], [place], [place] [address]    Culture   Final    NO GROWTH 3 DAYS Performed at [facility], [place] [address], [place], [place] [address]    Report Status PENDING  Incomplete  MRSA Next Gen by PCR, Nasal     Status: None   Collection Time: 02/16/21  3:35 PM   Specimen: Nasal Mucosa; Nasal Swab  Result Value Ref Range Status   MRSA by PCR Next Gen NOT DETECTED NOT DETECTED Final    Comment: (NOTE) The GeneXpert MRSA Assay (FDA approved for NASAL specimens only), is one component of a comprehensive MRSA colonization surveillance program. It is not intended to diagnose MRSA infection nor to guide or monitor treatment for MRSA infections. Test performance is not FDA approved in patients less than 41  years old. Performed at [facility], [place] [address] [place], [place] [address]   Urine Culture     Status: None   Collection Time: 02/18/21 10:14 AM   Specimen: Urine, Catheterized  Result Value Ref Range Status   Specimen Description URINE, CATHETERIZED  Final   Special Requests NONE  Final   Culture   Final    NO GROWTH Performed at [place] [address], [place],  [address]    Report Status 02/19/2021 FINAL  Final    RADIOLOGY STUDIES/RESULTS: ECHOCARDIOGRAM COMPLETE  Result Date: 02/18/2021    ECHOCARDIOGRAM REPORT   Patient Name:   MAREAH BOBE [facility] Date  of Exam: 02/17/2021 Medical Rec #:  [id number]            Height:       60.0 in Accession #:    [id number]           Weight:       110.0 lb Date of Birth:  1924/04/21            BSA:          1.448 m Patient Age:    89 years             BP:           132/88 mmHg Patient Gender: F                    HR:           66 bpm. Exam Location:  Inpatient Procedure: 2D Echo, Color Doppler and Cardiac Doppler                            MODIFIED REPORT: This report was modified by Adrian Prows MD on 02/18/2021 due to Addendum.  Indications:     Stroke I63.9  History:         Patient has prior history of Echocardiogram examinations, most                  recent 03/18/2012.  Sonographer:     Merrie Roof RDCS Referring Phys:  [place] Diagnosing Phys: Adrian Prows MD IMPRESSIONS  1. Left ventricular ejection fraction, by estimation, is 60 to 65%. The left ventricle has normal function. The left ventricle has no regional wall motion abnormalities. Left ventricular diastolic parameters are consistent with Grade I diastolic dysfunction (impaired relaxation). Elevated left ventricular end-diastolic pressure.  2. Right ventricular systolic function is normal. The right ventricular size is normal. There is normal pulmonary artery systolic pressure.  3. Left atrial size was moderately dilated.  4. The mitral valve is normal in  structure. Trivial mitral valve regurgitation.  5. The aortic valve is tricuspid. Aortic valve regurgitation is not visualized. Mild aortic valve sclerosis is present, with no evidence of aortic valve stenosis.  6. There is mild (Grade II) plaque.  7. The inferior vena cava is normal in size with greater than 50% respiratory variability, suggesting right atrial pressure of 3 mmHg. Comparison(s): No prior Echocardiogram. Conclusion(s)/Recommendation(s): During examination, patient was in and out of atrial flutter with 4: 1 conduction. Atrial flutter could be the source of cardioembolic phenomena. FINDINGS  Left Ventricle: Left ventricular ejection fraction, by estimation, is 60 to 65%. The left ventricle has normal function. The left ventricle has no regional wall motion abnormalities. The left ventricular internal cavity size was normal in size. There is  no left ventricular hypertrophy. Left ventricular diastolic parameters are consistent with Grade I diastolic dysfunction (impaired relaxation). Elevated left ventricular end-diastolic pressure. Right Ventricle: The right ventricular size is normal. No increase in right ventricular wall thickness. Right ventricular systolic function is normal. There is normal pulmonary artery systolic pressure. The tricuspid regurgitant velocity is 2.33 m/s, and  with an assumed right atrial pressure of 3 mmHg, the estimated right ventricular systolic pressure is [id number] mmHg. Left Atrium: Left atrial size was moderately dilated. Right Atrium: Right atrial size was normal in size. Pericardium: There is no evidence of pericardial effusion. Mitral Valve: The mitral valve is normal in structure. There is mild thickening of the  mitral valve leaflet(s). There is mild calcification of the mitral valve leaflet(s). Mild mitral annular calcification. Trivial mitral valve regurgitation. Tricuspid Valve: The tricuspid valve is normal in structure. Tricuspid valve regurgitation is mild. Aortic  Valve: The aortic valve is tricuspid. Aortic valve regurgitation is not visualized. Mild aortic valve sclerosis is present, with no evidence of aortic valve stenosis. Aortic valve mean gradient measures 6.0 mmHg. Aortic valve peak gradient measures 9.2 mmHg. Aortic valve area, by VTI measures 1.52 cm. Pulmonic Valve: The pulmonic valve was grossly normal. Pulmonic valve regurgitation is trivial. Aorta: The aortic root is normal in size and structure. There is mild (Grade II) plaque. Venous: The inferior vena cava is normal in size with greater than 50% respiratory variability, suggesting right atrial pressure of 3 mmHg. IAS/Shunts: The interatrial septum appears to be lipomatous. No atrial level shunt detected by color flow Doppler.  LEFT VENTRICLE PLAX 2D LVIDd:         3.30 cm  Diastology LVIDs:         2.20 cm  LV e' medial:    5.11 cm/s LV PW:         1.10 cm  LV E/e' medial:  17.5 LV IVS:        1.10 cm  LV e' lateral:   6.09 cm/s LVOT diam:     1.80 cm  LV E/e' lateral: 14.6 LV SV:         48 LV SV Index:   33 LVOT Area:     2.54 cm  RIGHT VENTRICLE RV Basal diam:  3.10 cm LEFT ATRIUM             Index       RIGHT ATRIUM           Index LA diam:        2.30 cm 1.59 cm/m  RA Area:     12.40 cm LA Vol (A2C):   42.8 ml 29.56 ml/m RA Volume:   32.00 ml  22.10 ml/m LA Vol (A4C):   48.4 ml 33.43 ml/m LA Biplane Vol: 47.9 ml 33.08 ml/m  AORTIC VALVE AV Area (Vmax):    1.52 cm AV Area (Vmean):   1.25 cm AV Area (VTI):     1.52 cm AV Vmax:           152.00 cm/s AV Vmean:          114.000 cm/s AV VTI:            0.317 m AV Peak Grad:      9.2 mmHg AV Mean Grad:      6.0 mmHg LVOT Vmax:         90.80 cm/s LVOT Vmean:        55.800 cm/s LVOT VTI:          0.189 m LVOT/AV VTI ratio: 0.60  AORTA Ao Root diam: 2.80 cm Ao Asc diam:  2.80 cm MITRAL VALVE                TRICUSPID VALVE MV Area (PHT): 2.68 cm     TR Peak grad:   21.7 mmHg MV Decel Time: 283 msec     TR Vmax:        233.00 cm/s MV E velocity: 89.20  cm/s MV A velocity: 136.00 cm/s  SHUNTS MV E/A ratio:  0.66         Systemic VTI:  0.19 m  Systemic Diam: 1.80 cm Adrian Prows MD Electronically signed by Adrian Prows MD Signature Date/Time: 02/18/2021/2:56:14 PM    Final (Updated)      LOS: 3 days   Oren Binet, MD  [facility]    To contact the attending provider between 7A-7P or the covering provider during after hours 7P-7A, please log into the web site [email or web address] and access using universal [place] password for that web site. If you do not have the password, please call the hospital operator.  02/19/2021, 10:26 AM

## 2021-02-19 NOTE — Progress Notes (Signed)
Pt arrived to Room 3W 04 via bed accompanied by transport personal. Pt confused, A&O self only. Routine assessment performed and vital signs taken per unit protocol (see flowsheet). Skin assessment done with Davy Pique, 2nd RN. Pt oriented to room and use of call bell. Bed alarm activated, bed locked and in lowest position with call bell and bedside table with reach. Will continue to monitor.

## 2021-02-19 NOTE — Progress Notes (Signed)
Daily Progress Note   Patient Name: Laura Colon       Date: 02/19/2021 DOB: 12-14-1923  Age: 87 y.o. MRN#: [id number] Attending Physician: Jonetta Osgood, MD Primary Care Physician: Kelton Pillar, MD Admit Date: 02/16/2021  Reason for Consultation/Follow-up: Establishing goals of care  Patient Profile/HPI:  87 y.o. female with past medical history of alzheimer's dementia, breast cancer s/p mastectomy, dysphagia, and syncope, who was admitted on 02/16/2021 with left sided weakness.  She was found on imaging to have two intracerebral hemorrhages - One large right sided hemorrhage (4.7 x 5.2 x 6.0 cm) an one in the left cerebellum (2.0 cm).   Patient's troponin, lactic acid and d-dimer were elevated on admission.  Creatinine is 0.70 and albumin is 3.8.   On my initial exam patient is sitting up working with speech therapy and eating puree.  Subjective: Patient is sitting up awake and alert- although she intermittently falls asleep and wakes back up.  She is eating bites of a pop tart- eats very minimal amount. Doesn't respond verbally to my questions. [name] is at bedside. She wasn't able to get the rest that she needed.  [name] reviewed her Mom's living will last night and noted that her Mom specifically stated that she did not want IV fluids, antibiotics and other life prolonging measures in the event that she had advanced dementia. Her will states that her preference would be for comfort measures only.  We discussed what full transition to comfort looks like- no more labs, IV fluids, giving medication for pain or discomfort but not trying to reverse any life limiting processes. Reviewed our discussion about Hospice support.  [name] is in agreement with plan for full comfort measures only.  She would like a referral for Hospice services in her home. She will need equipment at home and needs some time to get her house ready.    Physical Exam Vitals and nursing note reviewed.  Pulmonary:     Effort: Pulmonary effort is normal.  Skin:    General: Skin is warm and dry.  Neurological:     Mental Status: She is alert. She is disoriented.     Motor: Weakness present.            Vital Signs: BP (!) 159/83 (BP Location: Right Arm)   Pulse 75  Temp 98.4 F (36.9 C) (Oral)   Resp 14   Ht 5' (1.524 m)   Wt 49.9 kg   SpO2 98%   BMI 21.48 kg/m  SpO2: SpO2: 98 % O2 Device: O2 Device: Room Air O2 Flow Rate:    Intake/output summary:  Intake/Output Summary (Last 24 hours) at 02/19/2021 1106 Last data filed at 02/19/2021 0600 Gross per 24 hour  Intake 1148.02 ml  Output 225 ml  Net 923.02 ml    LBM: Last BM Date:  (pta) Baseline Weight: Weight: 49.9 kg Most recent weight: Weight: 49.9 kg       Palliative Assessment/Data: PPS: 30%      Patient Active Problem List   Diagnosis Date Noted   ICH (intracerebral hemorrhage) ([place]) 02/16/2021   Malnutrition ([place]) 03/21/2012   Inadequate oral intake 03/21/2012   Constipation 03/20/2012   Abdominal pain 03/19/2012   Hypotension 03/18/2012   Hypokalemia 03/18/2012   Weakness generalized 03/18/2012   Breast mass, right 03/18/2012   Near syncope 03/17/2012   Dementia [facility])     Palliative Care Assessment & Plan    Assessment/Recommendations/Plan  Transition to full comfort only- will d/c IV fluids and labs Refer for Hospice services at home   Code Status: DNR  Prognosis:  < 6 months likely due to new stroke in the setting of advanced dementia- stage 7C  Discharge Planning: To Be Determined  Care plan was discussed with patient's family- [name] and patient's care team.  Thank you for allowing the Palliative Medicine Team to assist in the care of this patient.  Total time:  46 mins Prolonged billing:  No     Greater than 50%  of this time was spent counseling and coordinating care related to the above assessment and plan.  Mariana Kaufman, AGNP-C Palliative Medicine   Please contact Palliative Medicine Team phone at [phone] for questions and concerns.

## 2021-02-19 NOTE — TOC Initial Note (Signed)
Transition of Care [facility]) - Initial/Assessment Note    Patient Details  Name: Laura Colon MRN: [id number] Date of Birth: [birth year]  Transition of Care [facility] CM/SW Contact:    Pollie Friar, RN Phone Number: 02/19/2021, 11:22 AM  Clinical Narrative:                 Patient is very confused. CM met with her and the patients daughter at the bedside. Daughter is asking for hospice services at home. Daughter provided choice and she asked to use [facility]. CM has sent referral to [place] with [facility].  Per daughter pt will need hospital bed and over the bed table. Daughter is unsure of other DME needs.  Daughter requesting to have PT/OT see pt and go over care for pt at home so she will know what level of care she will need and how to provide the care. CM has sent message to PT/OT.  Daughter is planning to arrange caregivers to help with care at home once she sees what her needs are with PT/OT. Daughter needs a few days to get things arranged at home in order to get DME into the home and things ready for her return home. MD updated.  TOC following.  Expected Discharge Plan: Home w Hospice Care Barriers to Discharge: Continued Medical Work up   Patient Goals and CMS Choice   CMS Medicare.gov Compare Post Acute Care list provided to:: Patient Represenative (must comment) Choice offered to / list presented to : Adult Children (daughter)  Expected Discharge Plan and Services Expected Discharge Plan: Home w Hospice Care   Discharge Planning Services: CM Consult Post Acute Care Choice: Hospice Living arrangements for the past 2 months: Single Family Home                                      Prior Living Arrangements/Services Living arrangements for the past 2 months: Single Family Home Lives with:: Adult Children Patient language and need for interpreter reviewed:: Yes Do you feel safe going back to the place where you live?: Yes      Need for Family  Participation in Patient Care: Yes (Comment) Care giver support system in place?: Yes (comment) Current home services: DME (3 in 1 and shower seat) Criminal Activity/Legal Involvement Pertinent to Current Situation/Hospitalization: No - Comment as needed  Activities of Daily Living   ADL Screening (condition at time of admission) Is the patient deaf or have difficulty hearing?: Yes Does the patient have difficulty seeing, even when wearing glasses/contacts?: Yes Does the patient have difficulty concentrating, remembering, or making decisions?: Yes Patient able to express need for assistance with ADLs?: Yes Does the patient have difficulty dressing or bathing?: Yes Independently performs ADLs?: No Communication: Independent Walks in Home: Independent with device (comment) (Walker and gait belt) Does the patient have difficulty walking or climbing stairs?: Yes  Permission Sought/Granted                  Emotional Assessment Appearance:: Appears stated age            Admission diagnosis:  ICH (intracerebral hemorrhage) ([place]) [I61.9] Intraparenchymal hemorrhage of brain [facility]) [I61.9] Patient Active Problem List   Diagnosis Date Noted   ICH (intracerebral hemorrhage) ([place]) 02/16/2021   Malnutrition ([place]) 03/21/2012   Inadequate oral intake 03/21/2012   Constipation 03/20/2012   Abdominal pain 03/19/2012   Hypotension 03/18/2012  Hypokalemia 03/18/2012   Weakness generalized 03/18/2012   Breast mass, right 03/18/2012   Near syncope 03/17/2012   Dementia ([place])    PCP:  Kelton Pillar, MD Pharmacy:   [place], [place] [facility] [address] [place] [address] Phone: [phone] Fax: [phone]     Social Determinants of Health (SDOH) Interventions    Readmission Risk Interventions No flowsheet data found.

## 2021-02-19 NOTE — Progress Notes (Addendum)
Physical Therapy Treatment Patient Details Name: Laura Colon MRN: [id number] DOB: February 19, 1924 Today's Date: 02/19/2021   History of Present Illness Laura Colon is an 87 y.o. female with Alzheimer's dementia, arthritis, and history of left breast cancer s/p mastectomy admitted with AMS progressing to nonverbal.  CT head revealed large early subacute R frontal ICH.    PT Comments    Pt received in supine, daughter Laura Colon present for caregiver instruction, pt oriented to self and participatory in functional mobility training with encouragement. Pt needing increased time and multimodal cues for sequencing simple 1-step commands and was able to perform bed mobility and stand/squat pivot transfers with therapist and daughter assisting her with mod to maxA. Pt did better pivoting with face to face technique than with RW, so recommend daughter use this technique at home and she was able to perform this with pt after extensive discussion and practice. Emphasis on pressure relief strategies, fall risk prevention, safe body mechanics, activity pacing and aspiration prevention with handouts given to reinforce safety and proper technique for these tasks. Pt continues to benefit from PT services to progress toward functional mobility goals.    Recommendations for follow up therapy are one component of a multi-disciplinary discharge planning process, led by the attending physician.  Recommendations may be updated based on patient status, additional functional criteria and insurance authorization.  Follow Up Recommendations  SNF;Supervision for mobility/OOB;Other (comment);Supervision/Assistance - 24 hour (if home, rec initial HHPT consult to for home safety/fall risk prevention or if Hospice able to provide similar service and caregiver training for pt family.)     Equipment Recommendations  Wheelchair cushion (measurements PT);Wheelchair (measurements PT);Hospital bed;3in1 (PT);Other (comment)  (may also benefit from mechanical lift for safety of pt and caregiver)    Recommendations for Other Services       Precautions / Restrictions Precautions Precautions: Fall Precaution Comments: L neglect, SBP <160, no BP on LUE (hx L mastectomy) Restrictions Weight Bearing Restrictions: No     Mobility  Bed Mobility Overal bed mobility: Needs Assistance Bed Mobility: Supine to Sit;Rolling;Sit to Sidelying Rolling: Mod assist   Supine to sit: Max assist   Sit to sidelying: Mod assist General bed mobility comments: pt requires (A) to progress to L side of the bed and sustain static sitting (able to progress to min guard/brief close Supervision while static sitting) and pt able to lean forward 2-3" outside BOS to apply lotion to knees/calves without LOB    Transfers Overall transfer level: Needs assistance Equipment used: Rolling walker (2 wheeled);None (face to face transfer) Transfers: Sit to/from [employer] Transfers Sit to Stand: Mod assist Stand pivot transfers: Mod assist;+2 physical assistance;+2 safety/equipment Squat pivot transfers: Max assist     General transfer comment: assist to stand to walker for balance and safety with some posterior bias, to step to wheelchair pt needed modA +2 with RW for balance and sequencing, increased time to perform. OT gave demo for pt daughter on stand pivot transfer to/from bed with caregiver also demoing transfer prior to having her perform with pt. Daughter Laura Colon present assisted pt to perform stand pivot transfer from wheelchair>bed with therapist instruction on safety with body mechanics and guarding for safety. PTA also then demonstrated squat pivot to assist pt to scoot higher toward [facility] with transfer pad and pt arms around therapist shoulders/upper back.  Ambulation/Gait                 Stairs  Wheelchair Mobility    Modified Rankin (Stroke Patients Only) Modified Rankin  (Stroke Patients Only) Pre-Morbid Rankin Score: Moderate disability Modified Rankin: Severe disability     Balance Overall balance assessment: Needs assistance   Sitting balance-Leahy Scale: Fair Sitting balance - Comments: min guard to Supervision with 1-2 UE support   Standing balance support: Bilateral upper extremity supported Standing balance-Leahy Scale:  (variable; poor to zero) Standing balance comment: reliant bil UE, posterior lean throughout standing                            Cognition Arousal/Alertness: Awake/alert Behavior During Therapy: WFL for tasks assessed/performed Overall Cognitive Status: History of cognitive impairments - at baseline                                 General Comments: Daughter Laura Colon present for caregiver training. Pt startles easily with movement and less attention to the L side but will look to L with cues (too look out the window, etc). Multimodal cues and increased time for simple mobility tasks, pt needs repetition of most cues.      Exercises      General Comments General comments (skin integrity, edema, etc.): HR 79-95 bpm with exertion; SpO2 96% on RA. Pt denies dizziness with transfers today.      Pertinent Vitals/Pain Pain Assessment: Faces Faces Pain Scale: Hurts a little bit Pain Location: RLE (heel?) pt having difficulty localizing and R forearm from hospital bracelets on skin Pain Descriptors / Indicators: Discomfort;Grimacing Pain Intervention(s): Limited activity within patient's tolerance;Monitored during session;Repositioned    Home Living                      Prior Function            PT Goals (current goals can now be found in the care plan section) Acute Rehab PT Goals Patient Stated Goal: to be able to care for her per family (daughter Laura Colon) PT Goal Formulation: With patient/family Time For Goal Achievement: 03/03/21 Potential to Achieve Goals: Fair Progress towards PT  goals: Progressing toward goals    Frequency    Min 3X/week      PT Plan Current plan remains appropriate    Co-evaluation PT/OT/SLP Co-Evaluation/Treatment: Yes Reason for Co-Treatment: Complexity of the patient's impairments (multi-system involvement);Necessary to address cognition/behavior during functional activity;For patient/therapist safety;To address functional/ADL transfers PT goals addressed during session: Mobility/safety with mobility;Balance;Proper use of DME;Strengthening/ROM        AM-PAC PT "6 Clicks" Mobility   Outcome Measure  Help needed turning from your back to your side while in a flat bed without using bedrails?: A Lot Help needed moving from lying on your back to sitting on the side of a flat bed without using bedrails?: A Lot Help needed moving to and from a bed to a chair (including a wheelchair)?: A Lot Help needed standing up from a chair using your arms (e.g., wheelchair or bedside chair)?: A Lot Help needed to walk in hospital room?: Total Help needed climbing 3-5 steps with a railing? : Total 6 Click Score: 10    End of Session Equipment Utilized During Treatment: Gait belt Activity Tolerance: Patient tolerated treatment well;Patient limited by fatigue Patient left: in bed;with call bell/phone within reach;with family/visitor present;with bed alarm set (mitts donned; pt daughter preparing to assist her with lunch in about a  half hour, RN notified) Nurse Communication: Mobility status;Other (comment) (she will need IV hooked back up, Bryan RN had removed it prior to transfer training for pt safety) PT Visit Diagnosis: Other abnormalities of gait and mobility (R26.89);Muscle weakness (generalized) (M62.81);Other symptoms and signs involving the nervous system (R29.898)     Time: [id number] PT Time Calculation (min) (ACUTE ONLY): 53 min  Charges:  $Therapeutic Activity: 8-22 mins                     Chester Romero P., PTA Acute Rehabilitation  Services Pager: [phone] Office: [place] 02/19/2021, 2:46 PM

## 2021-02-20 MED ORDER — ACETAMINOPHEN 325 MG PO TABS: 650.0000 mg | ORAL_TABLET | Freq: Four times a day (QID) | ORAL | Status: AC | PRN

## 2021-02-20 MED ORDER — PANTOPRAZOLE SODIUM 40 MG PO TBEC
40.0000 mg | DELAYED_RELEASE_TABLET | Freq: Every day | ORAL | Status: DC
  Administered 2021-02-20: 40 mg via ORAL
  Filled 2021-02-20: qty 1

## 2021-02-20 MED ORDER — AMLODIPINE BESYLATE 5 MG PO TABS: 5.0000 mg | ORAL_TABLET | Freq: Every day | ORAL | 0 refills | Status: AC

## 2021-02-20 NOTE — Progress Notes (Addendum)
Manufacturing engineer (ACC)  Received call from [name], to discuss DME.  Support provided, questions answered.  Pt will need a hospital bed, air overlay, and wheelchair. [facility] will order.  Venia Carbon RN, BSN, [place] [facility] Liaison  **addendum 550 pm, plan to dc home in am discussed with [facility] manager. [facility] has a Therapist, sports to admit the pt onto hospice services at 11 am on Friday. [name] is aware and is preparing a space for the DME delivery.

## 2021-02-20 NOTE — Progress Notes (Signed)
Tele called and said pt has ST elevation of 2.3 in MCL 2.2 in V-lead. Asymptomatic and denies pain. Resting comfortably in the bed.  Called Dr Hal Hope and he ordered an EKG.

## 2021-02-20 NOTE — Progress Notes (Signed)
Physical Therapy Treatment Patient Details Name: Laura Colon MRN: [id number] DOB: [birth year] Today's Date: 02/20/2021   History of Present Illness Laura Colon is an 87 y.o. female admitted with AMS progressing to nonverbal. CT head revealed large early subacute R frontal ICH. PMH: Alzheimer's dementia, arthritis, and history of left breast cancer s/p mastectomy.    PT Comments    Pt received in supine, agreeable to therapy session although visibly fatigued, so per discussion with pt/daughter, limited to bed-level session therapeutic exercises for strengthening/ROM for pt comfort. Pt repositioned for comfort with heels floated, reviewed DME and answered questions for caretaker Laura Colon. PT will continue to follow for caregiver education and progression with functional mobility for the pt. Continue to recommend 24 hour supervision/assist, pt now comfort care and plan for home with hospice, will continue to follow acutely per family request.   Recommendations for follow up therapy are one component of a multi-disciplinary discharge planning process, led by the attending physician.  Recommendations may be updated based on patient status, additional functional criteria and insurance authorization.  Follow Up Recommendations  Supervision for mobility/OOB;Other (comment);Supervision/Assistance - 24 hour (home with hospice)     Equipment Recommendations  Wheelchair cushion (measurements PT);Wheelchair (measurements PT);Hospital bed; (PT);Other (comment) (may also benefit from mechanical lift for safety of pt and caregiver)  Per pt daughter Laura Colon, she has RW and 3in1 at home.   Recommendations for Other Services       Precautions / Restrictions Precautions Precautions: Fall Precaution Comments: L neglect, SBP <160, no BP on LUE (hx L mastectomy) Restrictions Weight Bearing Restrictions: No     Mobility  Bed Mobility     General bed mobility comments: repositioned heels for  pressure relief on pillow but deferred EOB transfer due to pt fatigue         Modified Rankin (Stroke Patients Only) Modified Rankin (Stroke Patients Only) Pre-Morbid Rankin Score: Moderate disability Modified Rankin: Severe disability     Balance       Sitting balance - Comments: UTA, pt too fatigued to attempt                          Cognition Arousal/Alertness:  (pt sleeping upon arrival and remains somewhat drowsy) Behavior During Therapy: WFL for tasks assessed/performed Overall Cognitive Status: History of cognitive impairments - at baseline         General Comments: Pt limited tolerance today due to fatigue, bed-level session. Multimodal cues and increased time for simple mobility tasks/UE/LE exercises, pt needs repetition of most cues but able to attend to PTA on her L side with frequent cues. Pt daughter present and discussed DME with her per case mgmt request, she said hospice has been in contact with her about equipment needs.      Exercises Other Exercises Other Exercises: supine AAROM: ankle pumps, SAQ, heel slides, hip abduction x10 reps ea Other Exercises: supine BUE AAROM: elbow flex/ext, shoulder flexion/reaching above head, wrist flex/ext, gross grasp x10 reps ea    General Comments General comments (skin integrity, edema, etc.): VSS on RA per tele/chart review, limited supine session; pt frequently closing eyes and notably fatigued.      Pertinent Vitals/Pain Pain Assessment: No/denies pain Faces Pain Scale: No hurt Pain Intervention(s): Monitored during session;Repositioned;Limited activity within patient's tolerance     PT Goals (current goals can now be found in the care plan section) Acute Rehab PT Goals Patient Stated Goal: to be able to care  for her per family (daughter Laura Colon) PT Goal Formulation: With patient/family Time For Goal Achievement: 03/03/21 Potential to Achieve Goals: Fair Progress towards PT goals: Progressing toward  goals    Frequency    Min 3X/week      PT Plan Current plan remains appropriate       AM-PAC PT "6 Clicks" Mobility   Outcome Measure  Help needed turning from your back to your side while in a flat bed without using bedrails?: A Lot Help needed moving from lying on your back to sitting on the side of a flat bed without using bedrails?: A Lot Help needed moving to and from a bed to a chair (including a wheelchair)?: A Lot Help needed standing up from a chair using your arms (e.g., wheelchair or bedside chair)?: Total Help needed to walk in hospital room?: Total Help needed climbing 3-5 steps with a railing? : Total 6 Click Score: 9    End of Session   Activity Tolerance: Patient limited by fatigue Patient left: in bed;with call bell/phone within reach;with bed alarm set (single mitt donned (did not see another in room so R mitt donned). pt not pulling on lines and says she will nap) Nurse Communication: Mobility status PT Visit Diagnosis: Other abnormalities of gait and mobility (R26.89);Muscle weakness (generalized) (M62.81);Other symptoms and signs involving the nervous system (R29.898)     Time: [id number] PT Time Calculation (min) (ACUTE ONLY): 32 min  Charges:  $Therapeutic Exercise: 8-22 mins $Therapeutic Activity: 8-22 mins                     Laura Colon P., PTA Acute Rehabilitation Services Pager: [phone] Office: [place] 02/20/2021, 2:29 PM

## 2021-02-20 NOTE — Discharge Summary (Signed)
PATIENT DETAILS Name: Laura Colon Age: 87 y.o. Sex: female Date of Birth: 10/19/1923 MRN: [id number]. Admitting Physician: Kerney Elbe, MD [id number], Margaretha Sheffield, MD  Admit Date: 02/16/2021 Discharge date: 02/21/2021  Recommendations for Outpatient Follow-up:  Optimize comfort measures-being discharged home with hospice care.  Admitted From:  Home  Disposition: Home with home [place]: No  Equipment/Devices: None  Discharge Condition: Stable  CODE STATUS: DNR  Diet recommendation:  Diet Order             Diet general           Diet regular Room service appropriate? Yes; Fluid consistency: Thin  Diet effective now                    Brief Summary: Patient is a 87 y.o. female with history of dementia, arthritis, history of left breast cancer-presented with left-sided weakness-she was found to have [place] and subsequently admitted to the neurology service.  Upon stability-she was transferred to the [facility] hospitalist service on 9/14.  Brief Hospital Course: Large right frontal intraparenchymal hematoma/inferior left cerebellar hemorrhage: Continue supportive care-neurology with no further recommendations.  Evaluated by palliative care on 9/14-after discussion with family-transition to full comfort measures.  Plans are for home hospice on discharge.     HTN: BP remains on the higher side-start low-dose amlodipine and follow.   Alzheimer's disease: Pleasantly confused-continue supportive care.   Palliative care: DNR in place-upon further discussion with family by palliative care on 9/14-full comfort measures initiated-with plans for home hospice on discharge.  Patient appears comfortable-pleasantly confused-do not think she requires morphine/Ativan at this point-going forward-she can be evaluated by home hospice and started on these medications as needed.   Procedures None  Discharge Diagnoses:  Active Problems:   ICH (intracerebral  hemorrhage) ([place])   Discharge Instructions:  Activity:  As tolerated with Full fall precautions use walker/cane & assistance as needed   Discharge Instructions     Diet general   Complete by: As directed    Increase activity slowly   Complete by: As directed       Allergies as of 02/20/2021   No Known Allergies      Medication List     STOP taking these medications    aspirin 81 MG EC tablet   calcium-vitamin D 500-200 MG-UNIT tablet Commonly known as: OSCAL WITH D   DSS 100 MG Caps   feeding supplement (ENSURE COMPLETE) Liqd   ibuprofen 400 MG tablet Commonly known as: ADVIL   multivitamin with minerals Tabs tablet   simethicone 80 MG chewable tablet Commonly known as: MYLICON       TAKE these medications    acetaminophen 325 MG tablet Commonly known as: TYLENOL Take 2 tablets (650 mg total) by mouth every 6 (six) hours as needed for mild pain (or temp > 37.5 C (99.5 F)).   amLODipine 5 MG tablet Commonly known as: NORVASC Take 1 tablet (5 mg total) by mouth daily. Start taking on: February 21, 2021        No Known Allergies    Consultations:  Neuro Palliative   Other Procedures/Studies: CT HEAD WO CONTRAST (5MM)  Result Date: 02/17/2021 CLINICAL DATA:  Follow-up examination for intracranial hemorrhage. EXAM: CT HEAD WITHOUT CONTRAST TECHNIQUE: Contiguous axial images were obtained from the base of the skull through the vertex without intravenous contrast. COMPARISON:  Prior CT from 02/16/2021. FINDINGS: Brain: Acute intraparenchymal hemorrhage positioned at the anterior right frontal  lobe again seen, measuring 4.7 x 5.2 x 6.0 cm, not significantly changed from prior exam. Surrounding vasogenic edema is slightly more pronounced, although right-to-left shift measures relatively similar at 5 mm. No intraventricular extension into the adjacent right lateral ventricle. Associated small volume adjacent subarachnoid hemorrhage. Additional acute  hemorrhage within the inferior left cerebellum is relatively stable measuring approximately 2 cm. Mild surrounding edema without significant regional mass effect. No other acute intracranial hemorrhage. No acute large vessel territory infarct. Age-related cerebral atrophy with fairly advanced chronic microvascular ischemic disease again noted. No hydrocephalus or trapping. No significant extra-axial collection. Vascular: No hyperdense vessel. Calcified atherosclerosis present at skull base. Skull: Stable with no new finding. Sinuses/Orbits: Globes and orbital soft tissues demonstrate no acute finding. Ocular senescent calcifications noted. Paranasal sinuses and mastoid air cells remain largely clear. Other: None. IMPRESSION: 1. No significant interval change in size of large right frontal intraparenchymal hematoma. Surrounding vasogenic edema with regional mass effect and 5 mm of right-to-left shift, similar to previous. No intraventricular extension or other complication. 2. Additional smaller approximate 2 cm inferior left cerebellar hemorrhage without significant mass effect, stable. 3. No other new acute intracranial abnormality. Electronically Signed   By: Jeannine Boga M.D.   On: 02/17/2021 03:12   CT HEAD WO CONTRAST  Result Date: 02/16/2021 CLINICAL DATA:  Mental status change, unknown cause EXAM: CT HEAD WITHOUT CONTRAST TECHNIQUE: Contiguous axial images were obtained from the base of the skull through the vertex without intravenous contrast. COMPARISON:  CT head August 19, 2020. FINDINGS: Brain: Large acute intraparenchymal hemorrhage in the right frontal lobe, measuring up to 6.0 x 4.8 x 5.3 cm (estimated volume of 76 mL). Small volume of adjacent extra-axial extension of hemorrhage and moderate surrounding edema. Mass effect on the adjacent frontal horn of the right lateral ventricle with approximately 5 mm of leftward midline shift anteriorly. Acute hemorrhage abuts the lateral ventricle  without definite intraventricular extension. Additional acute intraparenchymal hemorrhage in the inferior left cerebellum measuring approximately 1.1 x 2.1 x 1.0 (estimated volume of 1.2 mL). Mild surrounding edema without significant mass effect. No evidence of acute large vascular territory infarct. No evidence of hydrocephalus. Basal cisterns are patent. Moderate patchy white matter hypoattenuation, nonspecific but compatible with chronic microvascular ischemic disease. Mild for age atrophy. Vascular: Calcific intracranial atherosclerosis. No hyperdense vessel identified. Skull: No acute fracture. Sinuses/Orbits: Visualized sinuses are clear. Other: No mastoid effusions. IMPRESSION: 1. Large acute intraparenchymal hemorrhage in the right frontal lobe, measuring up to 6.0 x 4.8 x 5.3 cm (estimated volume of 76 mL). Small volume of adjacent extra-axial extension of hemorrhage and moderate surrounding edema. Mass effect on the adjacent frontal horn of the right lateral ventricle with approximately 5 mm of leftward midline shift anteriorly. 2. Smaller acute intraparenchymal hemorrhage in the inferior left cerebellum measuring approximately 1.1 x 2.1 x 1.0 (estimated volume of 1.2 mL). Mild surrounding edema without significant mass effect. 3. The location of these hemorrhages is somewhat atypical for hypertensive bleeds or hemorrhagic conversion of infarcts. Recommend correlation with any history of trauma or anticoagulation. Amyloid angiopathy is a consideration, particularly given patient age. While no obvious mass was visible on the prior CT head from August 19, 2020, consider follow-up MRI with contrast after resolution of acute hemorrhage to exclude underlying masses. 4. Moderate chronic microvascular ischemic disease and mild atrophy. Findings discussed with Dr. Armandina Gemma via telephone at 11:34 AM. Electronically Signed   By: Margaretha Sheffield M.D.   On: 02/16/2021 11:50  DG Chest Port 1 View  Result Date:  02/16/2021 CLINICAL DATA:  Altered mental status. EXAM: PORTABLE CHEST 1 VIEW COMPARISON:  08/19/2020 FINDINGS: Normal sized heart. Clear lungs. Diffuse osteopenia. Mild to moderate thoracolumbar scoliosis. Cholecystectomy clips. IMPRESSION: No acute abnormality. Electronically Signed   By: Claudie Revering M.D.   On: 02/16/2021 13:18   ECHOCARDIOGRAM COMPLETE  Result Date: 02/18/2021    ECHOCARDIOGRAM REPORT   Patient Name:   Laura Colon [facility] Date of Exam: 02/17/2021 Medical Rec #:  [id number]            Height:       60.0 in Accession #:    [id number]           Weight:       110.0 lb Date of Birth:  10-Jul-1923            BSA:          1.448 m Patient Age:    89 years             BP:           132/88 mmHg Patient Gender: F                    HR:           66 bpm. Exam Location:  Inpatient Procedure: 2D Echo, Color Doppler and Cardiac Doppler                            MODIFIED REPORT: This report was modified by Adrian Prows MD on 02/18/2021 due to Addendum.  Indications:     Stroke I63.9  History:         Patient has prior history of Echocardiogram examinations, most                  recent 03/18/2012.  Sonographer:     Merrie Roof RDCS Referring Phys:  [place] Diagnosing Phys: Adrian Prows MD IMPRESSIONS  1. Left ventricular ejection fraction, by estimation, is 60 to 65%. The left ventricle has normal function. The left ventricle has no regional wall motion abnormalities. Left ventricular diastolic parameters are consistent with Grade I diastolic dysfunction (impaired relaxation). Elevated left ventricular end-diastolic pressure.  2. Right ventricular systolic function is normal. The right ventricular size is normal. There is normal pulmonary artery systolic pressure.  3. Left atrial size was moderately dilated.  4. The mitral valve is normal in structure. Trivial mitral valve regurgitation.  5. The aortic valve is tricuspid. Aortic valve regurgitation is not visualized. Mild aortic valve sclerosis is  present, with no evidence of aortic valve stenosis.  6. There is mild (Grade II) plaque.  7. The inferior vena cava is normal in size with greater than 50% respiratory variability, suggesting right atrial pressure of 3 mmHg. Comparison(s): No prior Echocardiogram. Conclusion(s)/Recommendation(s): During examination, patient was in and out of atrial flutter with 4: 1 conduction. Atrial flutter could be the source of cardioembolic phenomena. FINDINGS  Left Ventricle: Left ventricular ejection fraction, by estimation, is 60 to 65%. The left ventricle has normal function. The left ventricle has no regional wall motion abnormalities. The left ventricular internal cavity size was normal in size. There is  no left ventricular hypertrophy. Left ventricular diastolic parameters are consistent with Grade I diastolic dysfunction (impaired relaxation). Elevated left ventricular end-diastolic pressure. Right Ventricle: The right ventricular size is normal. No increase in right ventricular wall thickness.  Right ventricular systolic function is normal. There is normal pulmonary artery systolic pressure. The tricuspid regurgitant velocity is 2.33 m/s, and  with an assumed right atrial pressure of 3 mmHg, the estimated right ventricular systolic pressure is [id number] mmHg. Left Atrium: Left atrial size was moderately dilated. Right Atrium: Right atrial size was normal in size. Pericardium: There is no evidence of pericardial effusion. Mitral Valve: The mitral valve is normal in structure. There is mild thickening of the mitral valve leaflet(s). There is mild calcification of the mitral valve leaflet(s). Mild mitral annular calcification. Trivial mitral valve regurgitation. Tricuspid Valve: The tricuspid valve is normal in structure. Tricuspid valve regurgitation is mild. Aortic Valve: The aortic valve is tricuspid. Aortic valve regurgitation is not visualized. Mild aortic valve sclerosis is present, with no evidence of aortic valve  stenosis. Aortic valve mean gradient measures 6.0 mmHg. Aortic valve peak gradient measures 9.2 mmHg. Aortic valve area, by VTI measures 1.52 cm. Pulmonic Valve: The pulmonic valve was grossly normal. Pulmonic valve regurgitation is trivial. Aorta: The aortic root is normal in size and structure. There is mild (Grade II) plaque. Venous: The inferior vena cava is normal in size with greater than 50% respiratory variability, suggesting right atrial pressure of 3 mmHg. IAS/Shunts: The interatrial septum appears to be lipomatous. No atrial level shunt detected by color flow Doppler.  LEFT VENTRICLE PLAX 2D LVIDd:         3.30 cm  Diastology LVIDs:         2.20 cm  LV e' medial:    5.11 cm/s LV PW:         1.10 cm  LV E/e' medial:  17.5 LV IVS:        1.10 cm  LV e' lateral:   6.09 cm/s LVOT diam:     1.80 cm  LV E/e' lateral: 14.6 LV SV:         48 LV SV Index:   33 LVOT Area:     2.54 cm  RIGHT VENTRICLE RV Basal diam:  3.10 cm LEFT ATRIUM             Index       RIGHT ATRIUM           Index LA diam:        2.30 cm 1.59 cm/m  RA Area:     12.40 cm LA Vol (A2C):   42.8 ml 29.56 ml/m RA Volume:   32.00 ml  22.10 ml/m LA Vol (A4C):   48.4 ml 33.43 ml/m LA Biplane Vol: 47.9 ml 33.08 ml/m  AORTIC VALVE AV Area (Vmax):    1.52 cm AV Area (Vmean):   1.25 cm AV Area (VTI):     1.52 cm AV Vmax:           152.00 cm/s AV Vmean:          114.000 cm/s AV VTI:            0.317 m AV Peak Grad:      9.2 mmHg AV Mean Grad:      6.0 mmHg LVOT Vmax:         90.80 cm/s LVOT Vmean:        55.800 cm/s LVOT VTI:          0.189 m LVOT/AV VTI ratio: 0.60  AORTA Ao Root diam: 2.80 cm Ao Asc diam:  2.80 cm MITRAL VALVE  TRICUSPID VALVE MV Area (PHT): 2.68 cm     TR Peak grad:   21.7 mmHg MV Decel Time: 283 msec     TR Vmax:        233.00 cm/s MV E velocity: 89.20 cm/s MV A velocity: 136.00 cm/s  SHUNTS MV E/A ratio:  0.66         Systemic VTI:  0.19 m                             Systemic Diam: 1.80 cm Adrian Prows MD  Electronically signed by Adrian Prows MD Signature Date/Time: 02/18/2021/2:56:14 PM    Final (Updated)      TODAY-DAY OF DISCHARGE:  Subjective:   Laura Colon today remains pleasantly confused.  Objective:   Blood pressure (!) 143/77, pulse 88, temperature 98.6 F (37 C), temperature source Oral, resp. rate 14, height 5' (1.524 m), weight 49.9 kg, SpO2 94 %.  Intake/Output Summary (Last 24 hours) at 02/20/2021 1432 Last data filed at 02/19/2021 1800 Gross per 24 hour  Intake 240 ml  Output --  Net 240 ml   Filed Weights   02/16/21 1049  Weight: 49.9 kg    Exam: Pleasantly confused. [place].AT,PERRAL Supp  Pleasantly confusedle Neck,No JVD, No cervical lymphadenopathy appriciated.  Symmetrical Chest wall movement, Good air movement bilaterally, CTAB RRR,No Gallops,Rubs or new Murmurs, No Parasternal Heave +ve B.Sounds, Abd Soft, Non tender, No organomegaly appriciated, No rebound -guarding or rigidity. No Cyanosis, Clubbing or edema, No new Rash or bruise   PERTINENT RADIOLOGIC STUDIES: No results found.   PERTINENT LAB RESULTS: CBC: No results for input(s): WBC, HGB, HCT, PLT in the last 72 hours. CMET CMP     Component Value Date/Time   NA 141 02/16/2021 1112   K 3.6 02/16/2021 1112   CL 105 02/16/2021 1112   CO2 27 02/16/2021 1112   GLUCOSE 120 (H) 02/16/2021 1112   BUN 19 02/16/2021 1112   CREATININE 0.70 02/16/2021 1112   CALCIUM 9.0 02/16/2021 1112   PROT 7.1 02/16/2021 1112   ALBUMIN 3.8 02/16/2021 1112   AST 20 02/16/2021 1112   ALT 10 02/16/2021 1112   ALKPHOS 66 02/16/2021 1112   BILITOT 1.2 02/16/2021 1112   GFRNONAA >60 02/16/2021 1112   GFRAA 90 (L) 03/22/2012 0557    GFR Estimated Creatinine Clearance: 30.2 mL/min (by C-G formula based on SCr of 0.7 mg/dL). No results for input(s): LIPASE, AMYLASE in the last 72 hours. No results for input(s): CKTOTAL, CKMB, CKMBINDEX, TROPONINI in the last 72 hours. Invalid input(s): POCBNP No results  for input(s): DDIMER in the last 72 hours. No results for input(s): HGBA1C in the last 72 hours. Recent Labs    02/18/21 0423  TRIG 67   No results for input(s): TSH, T4TOTAL, T3FREE, THYROIDAB in the last 72 hours.  Invalid input(s): FREET3 No results for input(s): VITAMINB12, FOLATE, FERRITIN, TIBC, IRON, RETICCTPCT in the last 72 hours. Coags: No results for input(s): INR in the last 72 hours.  Invalid input(s): PT Microbiology: Recent Results (from the past 240 hour(s))  Blood Cultures (routine x 2)     Status: None (Preliminary result)   Collection Time: 02/16/21 11:00 AM   Specimen: BLOOD  Result Value Ref Range Status   Specimen Description   Final    BLOOD RIGHT ANTECUBITAL Performed at [facility] [address] [place], [place] [address]    Special Requests  Final    BOTTLES DRAWN AEROBIC AND ANAEROBIC Blood Culture adequate volume Performed at [facility] [address], [place], [place] [address]    Culture   Final    NO GROWTH 4 DAYS Performed at [facility], [place] [address], [place], [place] [address]    Report Status PENDING  Incomplete  Resp Panel by RT-PCR (Flu A&B, Covid) Nasopharyngeal Swab     Status: None   Collection Time: 02/16/21 11:05 AM   Specimen: Nasopharyngeal Swab; Nasopharyngeal(NP) swabs in vial transport medium  Result Value Ref Range Status   SARS Coronavirus 2 by RT PCR NEGATIVE NEGATIVE Final    Comment: (NOTE) SARS-CoV-2 target nucleic acids are NOT DETECTED.  The SARS-CoV-2 RNA is generally detectable in upper respiratory specimens during the acute phase of infection. The lowest concentration of SARS-CoV-2 viral copies this assay can detect is 138 copies/mL. A negative result does not preclude SARS-Cov-2 infection and should not be used as the sole basis for treatment or other patient management decisions. A negative result may occur with  improper specimen collection/handling,  submission of specimen other than nasopharyngeal swab, presence of viral mutation(s) within the areas targeted by this assay, and inadequate number of viral copies(<138 copies/mL). A negative result must be combined with clinical observations, patient history, and epidemiological information. The expected result is Negative.  Fact Sheet for Patients:  EntrepreneurPulse.com.au  Fact Sheet for Healthcare Providers:  IncredibleEmployment.be  This test is no t yet approved or cleared by the [place] FDA and  has been authorized for detection and/or diagnosis of SARS-CoV-2 by FDA under an Emergency Use Authorization (EUA). This EUA will remain  in effect (meaning this test can be used) for the duration of the COVID-19 declaration under Section 564(b)(1) of the Act, 21 U.S.C.section 360bbb-3(b)(1), unless the authorization is terminated  or revoked sooner.       Influenza A by PCR NEGATIVE NEGATIVE Final   Influenza B by PCR NEGATIVE NEGATIVE Final    Comment: (NOTE) The Xpert Xpress SARS-CoV-2/FLU/RSV plus assay is intended as an aid in the diagnosis of influenza from Nasopharyngeal swab specimens and should not be used as a sole basis for treatment. Nasal washings and aspirates are unacceptable for Xpert Xpress SARS-CoV-2/FLU/RSV testing.  Fact Sheet for Patients: EntrepreneurPulse.com.au  Fact Sheet for Healthcare Providers: IncredibleEmployment.be  This test is not yet approved or cleared by the [place] FDA and has been authorized for detection and/or diagnosis of SARS-CoV-2 by FDA under an Emergency Use Authorization (EUA). This EUA will remain in effect (meaning this test can be used) for the duration of the COVID-19 declaration under Section 564(b)(1) of the Act, 21 U.S.C. section 360bbb-3(b)(1), unless the authorization is terminated or revoked.  Performed at [facility], [place] [address], [place], [place] [address]   Blood Cultures (routine x 2)     Status: None (Preliminary result)   Collection Time: 02/16/21 11:12 AM   Specimen: BLOOD  Result Value Ref Range Status   Specimen Description   Final    BLOOD LEFT ANTECUBITAL Performed at [facility] [address], [place], [place] [address]    Special Requests   Final    BOTTLES DRAWN AEROBIC AND ANAEROBIC Blood Culture results may not be optimal due to an inadequate volume of blood received in culture bottles Performed at [facility] [address], [place],  [address]    Culture   Final  NO GROWTH 4 DAYS Performed at [facility], [place] [address], [place], [place] [address]    Report Status PENDING  Incomplete  MRSA Next Gen by PCR, Nasal     Status: None   Collection Time: 02/16/21  3:35 PM   Specimen: Nasal Mucosa; Nasal Swab  Result Value Ref Range Status   MRSA by PCR Next Gen NOT DETECTED NOT DETECTED Final    Comment: (NOTE) The GeneXpert MRSA Assay (FDA approved for NASAL specimens only), is one component of a comprehensive MRSA colonization surveillance program. It is not intended to diagnose MRSA infection nor to guide or monitor treatment for MRSA infections. Test performance is not FDA approved in patients less than 52 years old. Performed at [facility], [place] [address] [place], [place] [address]   Urine Culture     Status: None   Collection Time: 02/18/21 10:14 AM   Specimen: Urine, Catheterized  Result Value Ref Range Status   Specimen Description URINE, CATHETERIZED  Final   Special Requests NONE  Final   Culture   Final    NO GROWTH Performed at [facility] [address], [place], [place] [address]    Report Status 02/19/2021 FINAL  Final    FURTHER DISCHARGE INSTRUCTIONS:  Get Medicines reviewed and adjusted: Please take all your medications with you for your next visit with your  Primary MD  Laboratory/radiological data: Please request your Primary MD to go over all hospital tests and procedure/radiological results at the follow up, please ask your Primary MD to get all Hospital records sent to his/her office.  In some cases, they will be blood work, cultures and biopsy results pending at the time of your discharge. Please request that your primary care M.D. goes through all the records of your hospital data and follows up on these results.  Also Note the following: If you experience worsening of your admission symptoms, develop shortness of breath, life threatening emergency, suicidal or homicidal thoughts you must seek medical attention immediately by calling 911 or calling your MD immediately  if symptoms less severe.  You must read complete instructions/literature along with all the possible adverse reactions/side effects for all the Medicines you take and that have been prescribed to you. Take any new Medicines after you have completely understood and accpet all the possible adverse reactions/side effects.   Do not drive when taking Pain medications or sleeping medications (Benzodaizepines)  Do not take more than prescribed Pain, Sleep and Anxiety Medications. It is not advisable to combine anxiety,sleep and pain medications without talking with your primary care practitioner  Special Instructions: If you have smoked or chewed Tobacco  in the last 2 yrs please stop smoking, stop any regular Alcohol  and or any Recreational drug use.  Wear Seat belts while driving.  Please note: You were cared for by a hospitalist during your hospital stay. Once you are discharged, your primary care physician will handle any further medical issues. Please note that NO REFILLS for any discharge medications will be authorized once you are discharged, as it is imperative that you return to your primary care physician (or establish a relationship with a primary care physician if you do  not have one) for your post hospital discharge needs so that they can reassess your need for medications and monitor your lab values.  Total Time spent coordinating discharge including counseling, education and face to face time equals 25 minutes.  SignedOren Binet 02/20/2021 2:32  PM

## 2021-02-20 NOTE — Plan of Care (Signed)
  Problem: Education: Goal: Knowledge of disease or condition will improve Outcome: Progressing Goal: Knowledge of secondary prevention will improve Outcome: Progressing Goal: Knowledge of patient specific risk factors addressed and post discharge goals established will improve Outcome: Progressing Goal: Individualized Educational Video(s) Outcome: Progressing   Problem: Self-Care: Goal: Ability to participate in self-care as condition permits will improve Outcome: Progressing   Problem: Nutrition: Goal: Risk of aspiration will decrease Outcome: Progressing   Problem: Intracerebral Hemorrhage Tissue Perfusion: Goal: Complications of Intracerebral Hemorrhage will be minimized Outcome: Progressing

## 2021-02-20 NOTE — TOC Progression Note (Signed)
Transition of Care [facility]) - Progression Note    Patient Details  Name: Laura Colon MRN: [id number] Date of Birth: Oct 30, 1923  Transition of Care [facility] CM/SW Contact  Pollie Friar, RN Phone Number: 02/20/2021, 3:53 PM  Clinical Narrative:    Patient will d/c home with hospice services through [facility] in the am. DME for home has been delivered to the home. PTAR will provide transport with address being verified.  TOC following.   Expected Discharge Plan: Home w Hospice Care Barriers to Discharge: Continued Medical Work up  Expected Discharge Plan and Services Expected Discharge Plan: [place]   Discharge Planning Services: CM Consult Post Acute Care Choice: Hospice Living arrangements for the past 2 months: Single Family Home Expected Discharge Date: 02/21/21                                     Social Determinants of Health (SDOH) Interventions    Readmission Risk Interventions No flowsheet data found.

## 2021-02-20 NOTE — Plan of Care (Signed)
  Problem: Nutrition: Goal: Risk of aspiration will decrease Outcome: Progressing   Problem: Intracerebral Hemorrhage Tissue Perfusion: Goal: Complications of Intracerebral Hemorrhage will be minimized Outcome: Progressing

## 2021-02-20 NOTE — Progress Notes (Signed)
PROGRESS NOTE        PATIENT DETAILS Name: Laura Colon Age: 87 y.o. Sex: female Date of Birth: May 28, 1924 Admit Date: 02/16/2021 Admitting Physician Kerney Elbe, MD [id number], Margaretha Sheffield, MD  Brief Narrative: Patient is a 87 y.o. female with history of dementia, arthritis, history of left breast cancer-presented with left-sided weakness-she was found to have [place] and subsequently admitted to the neurology service.  Upon stability-she was transferred to the [facility] hospitalist service on 9/14.  Subjective:  Pleasantly confused-no major issues overnight.  Objective: Blood pressure (!) 143/77, pulse 88, temperature 98.6 F (37 C), temperature source Oral, resp. rate 14, height 5' (1.524 m), weight 49.9 kg, SpO2 94 %.   Gen Exam: Pleasantly confused-not in any distress. HEENT:atraumatic, normocephalic Chest: B/L clear to auscultation anteriorly CVS:S1S2 regular Abdomen:soft non tender, non distended Extremities: Difficult exam but seems to be moving all 4 extremities. Neurology: Non focal Skin: no rash   Pertinent labs: No labs today.  Assessment/Plan: Large right frontal intraparenchymal hematoma/inferior left cerebellar hemorrhage: Continue supportive care-neurology with no further recommendations.  Evaluated by palliative care on 9/14-after discussion with family-transition to full comfort measures.  Plans are for home hospice on discharge.    HTN: BP remains on the higher side-start low-dose amlodipine and follow.  Alzheimer's disease: Pleasantly confused-continue supportive care.  Palliative care: DNR in place-upon further discussion with family by palliative care on 9/14-full comfort measures initiated-with plans for home hospice on discharge.  Procedures : None  Consults: Neurology Palliative care  DVT Prophylaxis :   Not needed-comfort measures.  Diet: Diet Order             Diet regular Room service appropriate? Yes; Fluid  consistency: Thin  Diet effective now                    Code Status: DNR  Family Communication: None at bedside-palliative care engaging with family.  Disposition Plan: Status is: Inpatient  Remains inpatient appropriate because:Inpatient level of care appropriate due to severity of illness  Dispo: The patient is from: Home              Anticipated d/c is to: Home hospice.              Patient currently is medically stable to d/c.   Difficult to place patient No   Barriers to Discharge: Arrangement for home hospice/DME etc. before discharge.  Antimicrobial agents: Anti-infectives (From admission, onward)    None        Time spent: 15-minutes-Greater than 50% of this time was spent in counseling, explanation of diagnosis, planning of further management, and coordination of care.  MEDICATIONS: Scheduled Meds:   stroke: mapping our early stages of recovery book   Does not apply Once   amLODipine  5 mg Oral Daily   chlorhexidine  15 mL Mouth Rinse BID   Chlorhexidine Gluconate Cloth  6 each Topical Daily   mouth rinse  15 mL Mouth Rinse q12n4p   pantoprazole  40 mg Oral QHS   senna-docusate  1 tablet Oral BID   Continuous Infusions:   PRN Meds:.acetaminophen **OR** acetaminophen (TYLENOL) oral liquid 160 mg/5 mL **OR** acetaminophen  I have personally reviewed following labs and imaging studies  LABORATORY DATA: CBC: Recent Labs  Lab 02/16/21 1112  WBC 11.0*  NEUTROABS 9.6*  HGB 12.4  HCT 38.4  MCV 91.4  PLT 249     Basic Metabolic Panel: Recent Labs  Lab 02/16/21 1112  NA 141  K 3.6  CL 105  CO2 27  GLUCOSE 120*  BUN 19  CREATININE 0.70  CALCIUM 9.0     GFR: Estimated Creatinine Clearance: 30.2 mL/min (by C-G formula based on SCr of 0.7 mg/dL).  Liver Function Tests: Recent Labs  Lab 02/16/21 1112  AST 20  ALT 10  ALKPHOS 66  BILITOT 1.2  PROT 7.1  ALBUMIN 3.8    No results for input(s): LIPASE, AMYLASE in the last 168  hours. Recent Labs  Lab 02/16/21 1112  AMMONIA 12     Coagulation Profile: No results for input(s): INR, PROTIME in the last 168 hours.  Cardiac Enzymes: No results for input(s): CKTOTAL, CKMB, CKMBINDEX, TROPONINI in the last 168 hours.  BNP (last 3 results) No results for input(s): PROBNP in the last 8760 hours.  Lipid Profile: Recent Labs    02/18/21 0423  TRIG 67     Thyroid Function Tests: No results for input(s): TSH, T4TOTAL, FREET4, T3FREE, THYROIDAB in the last 72 hours.   Anemia Panel: No results for input(s): VITAMINB12, FOLATE, FERRITIN, TIBC, IRON, RETICCTPCT in the last 72 hours.  Urine analysis:    Component Value Date/Time   COLORURINE YELLOW 02/18/2021 1014   APPEARANCEUR CLEAR 02/18/2021 1014   LABSPEC 1.014 02/18/2021 1014   PHURINE 5.0 02/18/2021 1014   GLUCOSEU NEGATIVE 02/18/2021 1014   HGBUR MODERATE (A) 02/18/2021 1014   BILIRUBINUR NEGATIVE 02/18/2021 1014   KETONESUR 20 (A) 02/18/2021 1014   PROTEINUR NEGATIVE 02/18/2021 1014   UROBILINOGEN 1.0 03/17/2012 1057   NITRITE NEGATIVE 02/18/2021 1014   LEUKOCYTESUR NEGATIVE 02/18/2021 1014    Sepsis Labs: Lactic Acid, Venous    Component Value Date/Time   LATICACIDVEN 1.9 02/16/2021 1112    MICROBIOLOGY: Recent Results (from the past 240 hour(s))  Blood Cultures (routine x 2)     Status: None (Preliminary result)   Collection Time: 02/16/21 11:00 AM   Specimen: BLOOD  Result Value Ref Range Status   Specimen Description   Final    BLOOD RIGHT ANTECUBITAL Performed at [facility], [place] [address], [place], [place] [address]    Special Requests   Final    BOTTLES DRAWN AEROBIC AND ANAEROBIC Blood Culture adequate volume Performed at [facility] [address], [place], [place] [address]    Culture   Final    NO GROWTH 4 DAYS Performed at [facility], [place] [address], [place], [place] [address]    Report Status PENDING  Incomplete   Resp Panel by RT-PCR (Flu A&B, Covid) Nasopharyngeal Swab     Status: None   Collection Time: 02/16/21 11:05 AM   Specimen: Nasopharyngeal Swab; Nasopharyngeal(NP) swabs in vial transport medium  Result Value Ref Range Status   SARS Coronavirus 2 by RT PCR NEGATIVE NEGATIVE Final    Comment: (NOTE) SARS-CoV-2 target nucleic acids are NOT DETECTED.  The SARS-CoV-2 RNA is generally detectable in upper respiratory specimens during the acute phase of infection. The lowest concentration of SARS-CoV-2 viral copies this assay can detect is 138 copies/mL. A negative result does not preclude SARS-Cov-2 infection and should not be used as the sole basis for treatment or other patient management decisions. A negative result may occur with  improper specimen collection/handling, submission of specimen other than nasopharyngeal swab, presence of viral mutation(s) within the areas targeted  by this assay, and inadequate number of viral copies(<138 copies/mL). A negative result must be combined with clinical observations, patient history, and epidemiological information. The expected result is Negative.  Fact Sheet for Patients:  EntrepreneurPulse.com.au  Fact Sheet for Healthcare Providers:  IncredibleEmployment.be  This test is no t yet approved or cleared by the [place] FDA and  has been authorized for detection and/or diagnosis of SARS-CoV-2 by FDA under an Emergency Use Authorization (EUA). This EUA will remain  in effect (meaning this test can be used) for the duration of the COVID-19 declaration under Section 564(b)(1) of the Act, 21 U.S.C.section 360bbb-3(b)(1), unless the authorization is terminated  or revoked sooner.       Influenza A by PCR NEGATIVE NEGATIVE Final   Influenza B by PCR NEGATIVE NEGATIVE Final    Comment: (NOTE) The Xpert Xpress SARS-CoV-2/FLU/RSV plus assay is intended as an aid in the diagnosis of influenza from  Nasopharyngeal swab specimens and should not be used as a sole basis for treatment. Nasal washings and aspirates are unacceptable for Xpert Xpress SARS-CoV-2/FLU/RSV testing.  Fact Sheet for Patients: EntrepreneurPulse.com.au  Fact Sheet for Healthcare Providers: IncredibleEmployment.be  This test is not yet approved or cleared by the [place] FDA and has been authorized for detection and/or diagnosis of SARS-CoV-2 by FDA under an Emergency Use Authorization (EUA). This EUA will remain in effect (meaning this test can be used) for the duration of the COVID-19 declaration under Section 564(b)(1) of the Act, 21 U.S.C. section 360bbb-3(b)(1), unless the authorization is terminated or revoked.  Performed at [facility], [place] [address], [place], [place] [address]   Blood Cultures (routine x 2)     Status: None (Preliminary result)   Collection Time: 02/16/21 11:12 AM   Specimen: BLOOD  Result Value Ref Range Status   Specimen Description   Final    BLOOD LEFT ANTECUBITAL Performed at [facility] [address] [place], [place] [address]    Special Requests   Final    BOTTLES DRAWN AEROBIC AND ANAEROBIC Blood Culture results may not be optimal due to an inadequate volume of blood received in culture bottles Performed at [facility] [address], [place], [place] [address]    Culture   Final    NO GROWTH 4 DAYS Performed at [facility], [place] [address], [place], [place] [address]    Report Status PENDING  Incomplete  MRSA Next Gen by PCR, Nasal     Status: None   Collection Time: 02/16/21  3:35 PM   Specimen: Nasal Mucosa; Nasal Swab  Result Value Ref Range Status   MRSA by PCR Next Gen NOT DETECTED NOT DETECTED Final    Comment: (NOTE) The GeneXpert MRSA Assay (FDA approved for NASAL specimens only), is one component of a comprehensive MRSA colonization  surveillance program. It is not intended to diagnose MRSA infection nor to guide or monitor treatment for MRSA infections. Test performance is not FDA approved in patients less than 19 years old. Performed at [facility], [place] [address] [place], [place] [address]   Urine Culture     Status: None   Collection Time: 02/18/21 10:14 AM   Specimen: Urine, Catheterized  Result Value Ref Range Status   Specimen Description URINE, CATHETERIZED  Final   Special Requests NONE  Final   Culture   Final    NO GROWTH Performed at [facility] [address],  [place], [place] [address]    Report Status 02/19/2021 FINAL  Final    RADIOLOGY STUDIES/RESULTS: No results found.   LOS: 4 days   Oren Binet, MD  [facility]    To contact the attending provider between 7A-7P or the covering provider during after hours 7P-7A, please log into the web site [email or web address] and access using universal [place] password for that web site. If you do not have the password, please call the hospital operator.  02/20/2021, 11:57 AM

## 2021-02-21 LAB — CULTURE, BLOOD (ROUTINE X 2)
Culture: NO GROWTH
Culture: NO GROWTH
Special Requests: ADEQUATE

## 2021-02-21 NOTE — Progress Notes (Signed)
PROGRESS NOTE        PATIENT DETAILS Name: Laura Colon Age: 87 y.o. Sex: female Date of Birth: 09/25/1923 Admit Date: 02/16/2021 Admitting Physician Kerney Elbe, MD [id number], Margaretha Sheffield, MD  Brief Narrative: Patient is a 87 y.o. female with history of dementia, arthritis, history of left breast cancer-presented with left-sided weakness-she was found to have [place] and subsequently admitted to the neurology service.  Upon stability-she was transferred to the [facility] service on 9/14.  Subjective:  Eating breakfast-daughter feeding her.   Objective: Blood pressure (!) 143/77, pulse 88, temperature 98.6 F (37 C), temperature source Oral, resp. rate 14, height 5' (1.524 m), weight 49.9 kg, SpO2 94 %.   Lying comfortably in bed   Pertinent labs: No labs today.  Assessment/Plan: Large right frontal intraparenchymal hematoma/inferior left cerebellar hemorrhage: Continue supportive care-neurology with no further recommendations.  Evaluated by palliative care on 9/14-after discussion with family-transition to full comfort measures.  Plans are for discharge with home hospice today  HTN: BP remains on the higher side-start low-dose amlodipine and follow.  Alzheimer's disease: Pleasantly confused-continue supportive care.  Palliative care: DNR in place-upon further discussion with family by palliative care on 9/14-full comfort measures initiated-with plans for home hospice on discharge.  Procedures : None  Consults: Neurology Palliative care  DVT Prophylaxis :   Not needed-comfort measures.  Diet: Diet Order             Diet general           Diet regular Room service appropriate? Yes; Fluid consistency: Thin  Diet effective now                    Code Status: DNR  Family Communication: None at bedside-palliative care engaging with family.  Disposition Plan: Status is: Inpatient  Remains inpatient appropriate  because:Inpatient level of care appropriate due to severity of illness  Dispo: The patient is from: Home              Anticipated d/c is to: Home hospice.              Patient currently is medically stable to d/c.   Difficult to place patient No   Barriers to Discharge: Arrangement for home hospice/DME etc. before discharge.  Antimicrobial agents: Anti-infectives (From admission, onward)    None        Time spent: 15-minutes-Greater than 50% of this time was spent in counseling, explanation of diagnosis, planning of further management, and coordination of care.  MEDICATIONS: Scheduled Meds:   stroke: mapping our early stages of recovery book   Does not apply Once   amLODipine  5 mg Oral Daily   chlorhexidine  15 mL Mouth Rinse BID   mouth rinse  15 mL Mouth Rinse q12n4p   pantoprazole  40 mg Oral QHS   senna-docusate  1 tablet Oral BID   Continuous Infusions:   PRN Meds:.acetaminophen **OR** acetaminophen (TYLENOL) oral liquid 160 mg/5 mL **OR** acetaminophen  I have personally reviewed following labs and imaging studies  LABORATORY DATA: CBC: Recent Labs  Lab 02/16/21 1112  WBC 11.0*  NEUTROABS 9.6*  HGB 12.4  HCT 38.4  MCV 91.4  PLT 249     Basic Metabolic Panel: Recent Labs  Lab 02/16/21 1112  NA 141  K 3.6  CL 105  CO2 27  GLUCOSE 120*  BUN 19  CREATININE 0.70  CALCIUM 9.0     GFR: Estimated Creatinine Clearance: 30.2 mL/min (by C-G formula based on SCr of 0.7 mg/dL).  Liver Function Tests: Recent Labs  Lab 02/16/21 1112  AST 20  ALT 10  ALKPHOS 66  BILITOT 1.2  PROT 7.1  ALBUMIN 3.8    No results for input(s): LIPASE, AMYLASE in the last 168 hours. Recent Labs  Lab 02/16/21 1112  AMMONIA 12     Coagulation Profile: No results for input(s): INR, PROTIME in the last 168 hours.  Cardiac Enzymes: No results for input(s): CKTOTAL, CKMB, CKMBINDEX, TROPONINI in the last 168 hours.  BNP (last 3 results) No results for  input(s): PROBNP in the last 8760 hours.  Lipid Profile: No results for input(s): CHOL, HDL, LDLCALC, TRIG, CHOLHDL, LDLDIRECT in the last 72 hours.   Thyroid Function Tests: No results for input(s): TSH, T4TOTAL, FREET4, T3FREE, THYROIDAB in the last 72 hours.   Anemia Panel: No results for input(s): VITAMINB12, FOLATE, FERRITIN, TIBC, IRON, RETICCTPCT in the last 72 hours.  Urine analysis:    Component Value Date/Time   COLORURINE YELLOW 02/18/2021 1014   APPEARANCEUR CLEAR 02/18/2021 1014   LABSPEC 1.014 02/18/2021 1014   PHURINE 5.0 02/18/2021 1014   GLUCOSEU NEGATIVE 02/18/2021 1014   HGBUR MODERATE (A) 02/18/2021 1014   BILIRUBINUR NEGATIVE 02/18/2021 1014   KETONESUR 20 (A) 02/18/2021 1014   PROTEINUR NEGATIVE 02/18/2021 1014   UROBILINOGEN 1.0 03/17/2012 1057   NITRITE NEGATIVE 02/18/2021 1014   LEUKOCYTESUR NEGATIVE 02/18/2021 1014    Sepsis Labs: Lactic Acid, Venous    Component Value Date/Time   LATICACIDVEN 1.9 02/16/2021 1112    MICROBIOLOGY: Recent Results (from the past 240 hour(s))  Blood Cultures (routine x 2)     Status: None   Collection Time: 02/16/21 11:00 AM   Specimen: BLOOD  Result Value Ref Range Status   Specimen Description   Final    BLOOD RIGHT ANTECUBITAL Performed at [facility] [address] [place], [place] [address]    Special Requests   Final    BOTTLES DRAWN AEROBIC AND ANAEROBIC Blood Culture adequate volume Performed at [facility] [address], [place], [place] [address]    Culture   Final    NO GROWTH 5 DAYS Performed at [facility], [place] [address] [place], [place] [address]    Report Status 02/21/2021 FINAL  Final  Resp Panel by RT-PCR (Flu A&B, Covid) Nasopharyngeal Swab     Status: None   Collection Time: 02/16/21 11:05 AM   Specimen: Nasopharyngeal Swab; Nasopharyngeal(NP) swabs in vial transport medium  Result Value Ref Range Status   SARS Coronavirus 2 by RT  PCR NEGATIVE NEGATIVE Final    Comment: (NOTE) SARS-CoV-2 target nucleic acids are NOT DETECTED.  The SARS-CoV-2 RNA is generally detectable in upper respiratory specimens during the acute phase of infection. The lowest concentration of SARS-CoV-2 viral copies this assay can detect is 138 copies/mL. A negative result does not preclude SARS-Cov-2 infection and should not be used as the sole basis for treatment or other patient management decisions. A negative result may occur with  improper specimen collection/handling, submission of specimen other than nasopharyngeal swab, presence of viral mutation(s) within the areas targeted by this assay, and inadequate number of viral copies(<138 copies/mL). A negative result must be combined with clinical observations, patient history, and epidemiological information. The expected result is Negative.  Fact Sheet  for Patients:  EntrepreneurPulse.com.au  Fact Sheet for Healthcare Providers:  IncredibleEmployment.be  This test is no t yet approved or cleared by the [place] FDA and  has been authorized for detection and/or diagnosis of SARS-CoV-2 by FDA under an Emergency Use Authorization (EUA). This EUA will remain  in effect (meaning this test can be used) for the duration of the COVID-19 declaration under Section 564(b)(1) of the Act, 21 U.S.C.section 360bbb-3(b)(1), unless the authorization is terminated  or revoked sooner.       Influenza A by PCR NEGATIVE NEGATIVE Final   Influenza B by PCR NEGATIVE NEGATIVE Final    Comment: (NOTE) The Xpert Xpress SARS-CoV-2/FLU/RSV plus assay is intended as an aid in the diagnosis of influenza from Nasopharyngeal swab specimens and should not be used as a sole basis for treatment. Nasal washings and aspirates are unacceptable for Xpert Xpress SARS-CoV-2/FLU/RSV testing.  Fact Sheet for Patients: EntrepreneurPulse.com.au  Fact Sheet for  Healthcare Providers: IncredibleEmployment.be  This test is not yet approved or cleared by the [place] FDA and has been authorized for detection and/or diagnosis of SARS-CoV-2 by FDA under an Emergency Use Authorization (EUA). This EUA will remain in effect (meaning this test can be used) for the duration of the COVID-19 declaration under Section 564(b)(1) of the Act, 21 U.S.C. section 360bbb-3(b)(1), unless the authorization is terminated or revoked.  Performed at [facility], [place] [address], [place], [place] [address]   Blood Cultures (routine x 2)     Status: None   Collection Time: 02/16/21 11:12 AM   Specimen: BLOOD  Result Value Ref Range Status   Specimen Description   Final    BLOOD LEFT ANTECUBITAL Performed at [facility] [address] [place], [place] [address]    Special Requests   Final    BOTTLES DRAWN AEROBIC AND ANAEROBIC Blood Culture results may not be optimal due to an inadequate volume of blood received in culture bottles Performed at [facility] [address] [place], [place] [address]    Culture   Final    NO GROWTH 5 DAYS Performed at [facility], [place] [address], [place], [place] [address]    Report Status 02/21/2021 FINAL  Final  MRSA Next Gen by PCR, Nasal     Status: None   Collection Time: 02/16/21  3:35 PM   Specimen: Nasal Mucosa; Nasal Swab  Result Value Ref Range Status   MRSA by PCR Next Gen NOT DETECTED NOT DETECTED Final    Comment: (NOTE) The GeneXpert MRSA Assay (FDA approved for NASAL specimens only), is one component of a comprehensive MRSA colonization surveillance program. It is not intended to diagnose MRSA infection nor to guide or monitor treatment for MRSA infections. Test performance is not FDA approved in patients less than 62 years old. Performed at [facility], [place] [address] [place], [place] [address]   Urine Culture      Status: None   Collection Time: 02/18/21 10:14 AM   Specimen: Urine, Catheterized  Result Value Ref Range Status   Specimen Description URINE, CATHETERIZED  Final   Special Requests NONE  Final   Culture   Final    NO GROWTH Performed at [facility] [address] [place], [place] [address]    Report Status 02/19/2021 FINAL  Final    RADIOLOGY STUDIES/RESULTS: No results found.   LOS: 5 days   Oren Binet, MD  [facility]  To contact the attending provider between 7A-7P or the covering provider during after hours 7P-7A, please log into the web site [email or web address] and access using universal [place] password for that web site. If you do not have the password, please call the hospital operator.  02/21/2021, 9:14 AM

## 2021-02-21 NOTE — TOC Transition Note (Signed)
Transition of Care [facility]) - CM/SW Discharge Note   Patient Details  Name: Laura Colon MRN: [id number] Date of Birth: Apr 06, 1924  [facility] CM/SW Contact:  Pollie Friar, RN Phone Number: 02/21/2021, 8:12 AM   Clinical Narrative:    Patient is discharging home with hospice care through [facility]. Misty with [facility] aware of d/c. Daughter is at the bedside. PTAR to provide transport home. Bedside RN updated and d/c packet is at the desk.    Final next level of care: Home w Hospice Care Barriers to Discharge: No Barriers Identified   Patient Goals and CMS Choice   CMS Medicare.gov Compare Post Acute Care list provided to:: Patient Represenative (must comment) Choice offered to / list presented to : Adult Children (daughter)  Discharge Placement                       Discharge Plan and Services   Discharge Planning Services: CM Consult Post Acute Care Choice: Hospice                               Social Determinants of Health (SDOH) Interventions     Readmission Risk Interventions No flowsheet data found.

## 2021-02-25 DIAGNOSIS — R2689 Other abnormalities of gait and mobility: Secondary | ICD-10-CM | POA: Diagnosis not present

## 2021-02-25 DIAGNOSIS — I6990 Unspecified sequelae of unspecified cerebrovascular disease: Secondary | ICD-10-CM | POA: Diagnosis not present

## 2021-02-25 DIAGNOSIS — F0150 Vascular dementia without behavioral disturbance: Secondary | ICD-10-CM | POA: Diagnosis not present

## 2021-02-25 DIAGNOSIS — I129 Hypertensive chronic kidney disease with stage 1 through stage 4 chronic kidney disease, or unspecified chronic kidney disease: Secondary | ICD-10-CM | POA: Diagnosis not present

## 2021-02-25 DIAGNOSIS — E46 Unspecified protein-calorie malnutrition: Secondary | ICD-10-CM | POA: Diagnosis not present

## 2021-02-25 DIAGNOSIS — N182 Chronic kidney disease, stage 2 (mild): Secondary | ICD-10-CM | POA: Diagnosis not present

## 2021-02-25 DIAGNOSIS — Z853 Personal history of malignant neoplasm of breast: Secondary | ICD-10-CM | POA: Diagnosis not present

## 2021-02-25 DIAGNOSIS — K5900 Constipation, unspecified: Secondary | ICD-10-CM | POA: Diagnosis not present

## 2021-05-08 DIAGNOSIS — G309 Alzheimer's disease, unspecified: Secondary | ICD-10-CM | POA: Diagnosis not present

## 2021-05-08 DIAGNOSIS — Z6821 Body mass index (BMI) 21.0-21.9, adult: Secondary | ICD-10-CM | POA: Diagnosis not present

## 2021-05-08 DIAGNOSIS — Z853 Personal history of malignant neoplasm of breast: Secondary | ICD-10-CM | POA: Diagnosis not present

## 2021-05-08 DIAGNOSIS — I10 Essential (primary) hypertension: Secondary | ICD-10-CM | POA: Diagnosis not present

## 2021-05-08 DIAGNOSIS — I69354 Hemiplegia and hemiparesis following cerebral infarction affecting left non-dominant side: Secondary | ICD-10-CM | POA: Diagnosis not present

## 2021-05-08 DIAGNOSIS — E43 Unspecified severe protein-calorie malnutrition: Secondary | ICD-10-CM | POA: Diagnosis not present

## 2021-05-08 DIAGNOSIS — F0280 Dementia in other diseases classified elsewhere without behavioral disturbance: Secondary | ICD-10-CM | POA: Diagnosis not present

## 2021-05-08 DIAGNOSIS — R634 Abnormal weight loss: Secondary | ICD-10-CM | POA: Diagnosis not present

## 2021-06-08 DIAGNOSIS — E43 Unspecified severe protein-calorie malnutrition: Secondary | ICD-10-CM | POA: Diagnosis not present

## 2021-06-08 DIAGNOSIS — Z853 Personal history of malignant neoplasm of breast: Secondary | ICD-10-CM | POA: Diagnosis not present

## 2021-06-08 DIAGNOSIS — F0280 Dementia in other diseases classified elsewhere without behavioral disturbance: Secondary | ICD-10-CM | POA: Diagnosis not present

## 2021-06-08 DIAGNOSIS — Z6821 Body mass index (BMI) 21.0-21.9, adult: Secondary | ICD-10-CM | POA: Diagnosis not present

## 2021-06-08 DIAGNOSIS — G309 Alzheimer's disease, unspecified: Secondary | ICD-10-CM | POA: Diagnosis not present

## 2021-06-08 DIAGNOSIS — I69354 Hemiplegia and hemiparesis following cerebral infarction affecting left non-dominant side: Secondary | ICD-10-CM | POA: Diagnosis not present

## 2021-06-08 DIAGNOSIS — I10 Essential (primary) hypertension: Secondary | ICD-10-CM | POA: Diagnosis not present

## 2021-06-08 DIAGNOSIS — R634 Abnormal weight loss: Secondary | ICD-10-CM | POA: Diagnosis not present

## 2021-06-09 DIAGNOSIS — G309 Alzheimer's disease, unspecified: Secondary | ICD-10-CM | POA: Diagnosis not present

## 2021-06-09 DIAGNOSIS — Z6821 Body mass index (BMI) 21.0-21.9, adult: Secondary | ICD-10-CM | POA: Diagnosis not present

## 2021-06-09 DIAGNOSIS — I10 Essential (primary) hypertension: Secondary | ICD-10-CM | POA: Diagnosis not present

## 2021-06-09 DIAGNOSIS — I69354 Hemiplegia and hemiparesis following cerebral infarction affecting left non-dominant side: Secondary | ICD-10-CM | POA: Diagnosis not present

## 2021-06-09 DIAGNOSIS — F0280 Dementia in other diseases classified elsewhere without behavioral disturbance: Secondary | ICD-10-CM | POA: Diagnosis not present

## 2021-06-09 DIAGNOSIS — E43 Unspecified severe protein-calorie malnutrition: Secondary | ICD-10-CM | POA: Diagnosis not present

## 2021-06-10 DIAGNOSIS — I69354 Hemiplegia and hemiparesis following cerebral infarction affecting left non-dominant side: Secondary | ICD-10-CM | POA: Diagnosis not present

## 2021-06-10 DIAGNOSIS — E43 Unspecified severe protein-calorie malnutrition: Secondary | ICD-10-CM | POA: Diagnosis not present

## 2021-06-10 DIAGNOSIS — Z6821 Body mass index (BMI) 21.0-21.9, adult: Secondary | ICD-10-CM | POA: Diagnosis not present

## 2021-06-10 DIAGNOSIS — I10 Essential (primary) hypertension: Secondary | ICD-10-CM | POA: Diagnosis not present

## 2021-06-10 DIAGNOSIS — F0280 Dementia in other diseases classified elsewhere without behavioral disturbance: Secondary | ICD-10-CM | POA: Diagnosis not present

## 2021-06-10 DIAGNOSIS — G309 Alzheimer's disease, unspecified: Secondary | ICD-10-CM | POA: Diagnosis not present

## 2021-06-13 DIAGNOSIS — G309 Alzheimer's disease, unspecified: Secondary | ICD-10-CM | POA: Diagnosis not present

## 2021-06-13 DIAGNOSIS — Z6821 Body mass index (BMI) 21.0-21.9, adult: Secondary | ICD-10-CM | POA: Diagnosis not present

## 2021-06-13 DIAGNOSIS — E43 Unspecified severe protein-calorie malnutrition: Secondary | ICD-10-CM | POA: Diagnosis not present

## 2021-06-13 DIAGNOSIS — I69354 Hemiplegia and hemiparesis following cerebral infarction affecting left non-dominant side: Secondary | ICD-10-CM | POA: Diagnosis not present

## 2021-06-13 DIAGNOSIS — F0280 Dementia in other diseases classified elsewhere without behavioral disturbance: Secondary | ICD-10-CM | POA: Diagnosis not present

## 2021-06-13 DIAGNOSIS — I10 Essential (primary) hypertension: Secondary | ICD-10-CM | POA: Diagnosis not present

## 2021-06-16 DIAGNOSIS — G309 Alzheimer's disease, unspecified: Secondary | ICD-10-CM | POA: Diagnosis not present

## 2021-06-16 DIAGNOSIS — F0280 Dementia in other diseases classified elsewhere without behavioral disturbance: Secondary | ICD-10-CM | POA: Diagnosis not present

## 2021-06-16 DIAGNOSIS — I69354 Hemiplegia and hemiparesis following cerebral infarction affecting left non-dominant side: Secondary | ICD-10-CM | POA: Diagnosis not present

## 2021-06-16 DIAGNOSIS — Z6821 Body mass index (BMI) 21.0-21.9, adult: Secondary | ICD-10-CM | POA: Diagnosis not present

## 2021-06-16 DIAGNOSIS — I10 Essential (primary) hypertension: Secondary | ICD-10-CM | POA: Diagnosis not present

## 2021-06-16 DIAGNOSIS — E43 Unspecified severe protein-calorie malnutrition: Secondary | ICD-10-CM | POA: Diagnosis not present

## 2021-06-17 DIAGNOSIS — I10 Essential (primary) hypertension: Secondary | ICD-10-CM | POA: Diagnosis not present

## 2021-06-17 DIAGNOSIS — G309 Alzheimer's disease, unspecified: Secondary | ICD-10-CM | POA: Diagnosis not present

## 2021-06-17 DIAGNOSIS — I69354 Hemiplegia and hemiparesis following cerebral infarction affecting left non-dominant side: Secondary | ICD-10-CM | POA: Diagnosis not present

## 2021-06-17 DIAGNOSIS — Z6821 Body mass index (BMI) 21.0-21.9, adult: Secondary | ICD-10-CM | POA: Diagnosis not present

## 2021-06-17 DIAGNOSIS — E43 Unspecified severe protein-calorie malnutrition: Secondary | ICD-10-CM | POA: Diagnosis not present

## 2021-06-17 DIAGNOSIS — F0280 Dementia in other diseases classified elsewhere without behavioral disturbance: Secondary | ICD-10-CM | POA: Diagnosis not present

## 2021-06-20 DIAGNOSIS — Z6821 Body mass index (BMI) 21.0-21.9, adult: Secondary | ICD-10-CM | POA: Diagnosis not present

## 2021-06-20 DIAGNOSIS — E43 Unspecified severe protein-calorie malnutrition: Secondary | ICD-10-CM | POA: Diagnosis not present

## 2021-06-20 DIAGNOSIS — F0280 Dementia in other diseases classified elsewhere without behavioral disturbance: Secondary | ICD-10-CM | POA: Diagnosis not present

## 2021-06-20 DIAGNOSIS — I69354 Hemiplegia and hemiparesis following cerebral infarction affecting left non-dominant side: Secondary | ICD-10-CM | POA: Diagnosis not present

## 2021-06-20 DIAGNOSIS — I10 Essential (primary) hypertension: Secondary | ICD-10-CM | POA: Diagnosis not present

## 2021-06-20 DIAGNOSIS — G309 Alzheimer's disease, unspecified: Secondary | ICD-10-CM | POA: Diagnosis not present

## 2021-06-24 DIAGNOSIS — Z6821 Body mass index (BMI) 21.0-21.9, adult: Secondary | ICD-10-CM | POA: Diagnosis not present

## 2021-06-24 DIAGNOSIS — I10 Essential (primary) hypertension: Secondary | ICD-10-CM | POA: Diagnosis not present

## 2021-06-24 DIAGNOSIS — F0280 Dementia in other diseases classified elsewhere without behavioral disturbance: Secondary | ICD-10-CM | POA: Diagnosis not present

## 2021-06-24 DIAGNOSIS — I69354 Hemiplegia and hemiparesis following cerebral infarction affecting left non-dominant side: Secondary | ICD-10-CM | POA: Diagnosis not present

## 2021-06-24 DIAGNOSIS — E43 Unspecified severe protein-calorie malnutrition: Secondary | ICD-10-CM | POA: Diagnosis not present

## 2021-06-24 DIAGNOSIS — G309 Alzheimer's disease, unspecified: Secondary | ICD-10-CM | POA: Diagnosis not present

## 2021-06-25 DIAGNOSIS — G309 Alzheimer's disease, unspecified: Secondary | ICD-10-CM | POA: Diagnosis not present

## 2021-06-25 DIAGNOSIS — Z6821 Body mass index (BMI) 21.0-21.9, adult: Secondary | ICD-10-CM | POA: Diagnosis not present

## 2021-06-25 DIAGNOSIS — E43 Unspecified severe protein-calorie malnutrition: Secondary | ICD-10-CM | POA: Diagnosis not present

## 2021-06-25 DIAGNOSIS — I69354 Hemiplegia and hemiparesis following cerebral infarction affecting left non-dominant side: Secondary | ICD-10-CM | POA: Diagnosis not present

## 2021-06-25 DIAGNOSIS — I10 Essential (primary) hypertension: Secondary | ICD-10-CM | POA: Diagnosis not present

## 2021-06-25 DIAGNOSIS — F0280 Dementia in other diseases classified elsewhere without behavioral disturbance: Secondary | ICD-10-CM | POA: Diagnosis not present

## 2021-06-27 DIAGNOSIS — Z6821 Body mass index (BMI) 21.0-21.9, adult: Secondary | ICD-10-CM | POA: Diagnosis not present

## 2021-06-27 DIAGNOSIS — G309 Alzheimer's disease, unspecified: Secondary | ICD-10-CM | POA: Diagnosis not present

## 2021-06-27 DIAGNOSIS — F0280 Dementia in other diseases classified elsewhere without behavioral disturbance: Secondary | ICD-10-CM | POA: Diagnosis not present

## 2021-06-27 DIAGNOSIS — I10 Essential (primary) hypertension: Secondary | ICD-10-CM | POA: Diagnosis not present

## 2021-06-27 DIAGNOSIS — E43 Unspecified severe protein-calorie malnutrition: Secondary | ICD-10-CM | POA: Diagnosis not present

## 2021-06-27 DIAGNOSIS — I69354 Hemiplegia and hemiparesis following cerebral infarction affecting left non-dominant side: Secondary | ICD-10-CM | POA: Diagnosis not present

## 2021-06-30 DIAGNOSIS — E43 Unspecified severe protein-calorie malnutrition: Secondary | ICD-10-CM | POA: Diagnosis not present

## 2021-06-30 DIAGNOSIS — G309 Alzheimer's disease, unspecified: Secondary | ICD-10-CM | POA: Diagnosis not present

## 2021-06-30 DIAGNOSIS — I10 Essential (primary) hypertension: Secondary | ICD-10-CM | POA: Diagnosis not present

## 2021-06-30 DIAGNOSIS — F0280 Dementia in other diseases classified elsewhere without behavioral disturbance: Secondary | ICD-10-CM | POA: Diagnosis not present

## 2021-06-30 DIAGNOSIS — Z6821 Body mass index (BMI) 21.0-21.9, adult: Secondary | ICD-10-CM | POA: Diagnosis not present

## 2021-06-30 DIAGNOSIS — I69354 Hemiplegia and hemiparesis following cerebral infarction affecting left non-dominant side: Secondary | ICD-10-CM | POA: Diagnosis not present

## 2021-07-01 DIAGNOSIS — Z6821 Body mass index (BMI) 21.0-21.9, adult: Secondary | ICD-10-CM | POA: Diagnosis not present

## 2021-07-01 DIAGNOSIS — I10 Essential (primary) hypertension: Secondary | ICD-10-CM | POA: Diagnosis not present

## 2021-07-01 DIAGNOSIS — F0280 Dementia in other diseases classified elsewhere without behavioral disturbance: Secondary | ICD-10-CM | POA: Diagnosis not present

## 2021-07-01 DIAGNOSIS — I69354 Hemiplegia and hemiparesis following cerebral infarction affecting left non-dominant side: Secondary | ICD-10-CM | POA: Diagnosis not present

## 2021-07-01 DIAGNOSIS — G309 Alzheimer's disease, unspecified: Secondary | ICD-10-CM | POA: Diagnosis not present

## 2021-07-01 DIAGNOSIS — E43 Unspecified severe protein-calorie malnutrition: Secondary | ICD-10-CM | POA: Diagnosis not present

## 2021-07-04 DIAGNOSIS — Z6821 Body mass index (BMI) 21.0-21.9, adult: Secondary | ICD-10-CM | POA: Diagnosis not present

## 2021-07-04 DIAGNOSIS — I10 Essential (primary) hypertension: Secondary | ICD-10-CM | POA: Diagnosis not present

## 2021-07-04 DIAGNOSIS — I69354 Hemiplegia and hemiparesis following cerebral infarction affecting left non-dominant side: Secondary | ICD-10-CM | POA: Diagnosis not present

## 2021-07-04 DIAGNOSIS — G309 Alzheimer's disease, unspecified: Secondary | ICD-10-CM | POA: Diagnosis not present

## 2021-07-04 DIAGNOSIS — F0280 Dementia in other diseases classified elsewhere without behavioral disturbance: Secondary | ICD-10-CM | POA: Diagnosis not present

## 2021-07-04 DIAGNOSIS — E43 Unspecified severe protein-calorie malnutrition: Secondary | ICD-10-CM | POA: Diagnosis not present

## 2021-07-07 DIAGNOSIS — E43 Unspecified severe protein-calorie malnutrition: Secondary | ICD-10-CM | POA: Diagnosis not present

## 2021-07-07 DIAGNOSIS — I69354 Hemiplegia and hemiparesis following cerebral infarction affecting left non-dominant side: Secondary | ICD-10-CM | POA: Diagnosis not present

## 2021-07-07 DIAGNOSIS — F0280 Dementia in other diseases classified elsewhere without behavioral disturbance: Secondary | ICD-10-CM | POA: Diagnosis not present

## 2021-07-07 DIAGNOSIS — G309 Alzheimer's disease, unspecified: Secondary | ICD-10-CM | POA: Diagnosis not present

## 2021-07-07 DIAGNOSIS — I10 Essential (primary) hypertension: Secondary | ICD-10-CM | POA: Diagnosis not present

## 2021-07-07 DIAGNOSIS — Z6821 Body mass index (BMI) 21.0-21.9, adult: Secondary | ICD-10-CM | POA: Diagnosis not present

## 2021-07-08 DIAGNOSIS — F0280 Dementia in other diseases classified elsewhere without behavioral disturbance: Secondary | ICD-10-CM | POA: Diagnosis not present

## 2021-07-08 DIAGNOSIS — I69354 Hemiplegia and hemiparesis following cerebral infarction affecting left non-dominant side: Secondary | ICD-10-CM | POA: Diagnosis not present

## 2021-07-08 DIAGNOSIS — Z6821 Body mass index (BMI) 21.0-21.9, adult: Secondary | ICD-10-CM | POA: Diagnosis not present

## 2021-07-08 DIAGNOSIS — I10 Essential (primary) hypertension: Secondary | ICD-10-CM | POA: Diagnosis not present

## 2021-07-08 DIAGNOSIS — G309 Alzheimer's disease, unspecified: Secondary | ICD-10-CM | POA: Diagnosis not present

## 2021-07-08 DIAGNOSIS — E43 Unspecified severe protein-calorie malnutrition: Secondary | ICD-10-CM | POA: Diagnosis not present

## 2021-07-09 DIAGNOSIS — F0280 Dementia in other diseases classified elsewhere without behavioral disturbance: Secondary | ICD-10-CM | POA: Diagnosis not present

## 2021-07-09 DIAGNOSIS — G309 Alzheimer's disease, unspecified: Secondary | ICD-10-CM | POA: Diagnosis not present

## 2021-07-09 DIAGNOSIS — E43 Unspecified severe protein-calorie malnutrition: Secondary | ICD-10-CM | POA: Diagnosis not present

## 2021-07-09 DIAGNOSIS — R634 Abnormal weight loss: Secondary | ICD-10-CM | POA: Diagnosis not present

## 2021-07-09 DIAGNOSIS — I10 Essential (primary) hypertension: Secondary | ICD-10-CM | POA: Diagnosis not present

## 2021-07-09 DIAGNOSIS — Z6821 Body mass index (BMI) 21.0-21.9, adult: Secondary | ICD-10-CM | POA: Diagnosis not present

## 2021-07-09 DIAGNOSIS — Z853 Personal history of malignant neoplasm of breast: Secondary | ICD-10-CM | POA: Diagnosis not present

## 2021-07-09 DIAGNOSIS — I69354 Hemiplegia and hemiparesis following cerebral infarction affecting left non-dominant side: Secondary | ICD-10-CM | POA: Diagnosis not present

## 2021-07-11 DIAGNOSIS — I10 Essential (primary) hypertension: Secondary | ICD-10-CM | POA: Diagnosis not present

## 2021-07-11 DIAGNOSIS — G309 Alzheimer's disease, unspecified: Secondary | ICD-10-CM | POA: Diagnosis not present

## 2021-07-11 DIAGNOSIS — Z6821 Body mass index (BMI) 21.0-21.9, adult: Secondary | ICD-10-CM | POA: Diagnosis not present

## 2021-07-11 DIAGNOSIS — E43 Unspecified severe protein-calorie malnutrition: Secondary | ICD-10-CM | POA: Diagnosis not present

## 2021-07-11 DIAGNOSIS — I69354 Hemiplegia and hemiparesis following cerebral infarction affecting left non-dominant side: Secondary | ICD-10-CM | POA: Diagnosis not present

## 2021-07-11 DIAGNOSIS — F0280 Dementia in other diseases classified elsewhere without behavioral disturbance: Secondary | ICD-10-CM | POA: Diagnosis not present

## 2021-07-14 DIAGNOSIS — F0280 Dementia in other diseases classified elsewhere without behavioral disturbance: Secondary | ICD-10-CM | POA: Diagnosis not present

## 2021-07-14 DIAGNOSIS — I69354 Hemiplegia and hemiparesis following cerebral infarction affecting left non-dominant side: Secondary | ICD-10-CM | POA: Diagnosis not present

## 2021-07-14 DIAGNOSIS — G309 Alzheimer's disease, unspecified: Secondary | ICD-10-CM | POA: Diagnosis not present

## 2021-07-14 DIAGNOSIS — Z6821 Body mass index (BMI) 21.0-21.9, adult: Secondary | ICD-10-CM | POA: Diagnosis not present

## 2021-07-14 DIAGNOSIS — I10 Essential (primary) hypertension: Secondary | ICD-10-CM | POA: Diagnosis not present

## 2021-07-14 DIAGNOSIS — E43 Unspecified severe protein-calorie malnutrition: Secondary | ICD-10-CM | POA: Diagnosis not present

## 2021-07-15 DIAGNOSIS — I10 Essential (primary) hypertension: Secondary | ICD-10-CM | POA: Diagnosis not present

## 2021-07-15 DIAGNOSIS — Z6821 Body mass index (BMI) 21.0-21.9, adult: Secondary | ICD-10-CM | POA: Diagnosis not present

## 2021-07-15 DIAGNOSIS — I69354 Hemiplegia and hemiparesis following cerebral infarction affecting left non-dominant side: Secondary | ICD-10-CM | POA: Diagnosis not present

## 2021-07-15 DIAGNOSIS — E43 Unspecified severe protein-calorie malnutrition: Secondary | ICD-10-CM | POA: Diagnosis not present

## 2021-07-15 DIAGNOSIS — G309 Alzheimer's disease, unspecified: Secondary | ICD-10-CM | POA: Diagnosis not present

## 2021-07-15 DIAGNOSIS — F0280 Dementia in other diseases classified elsewhere without behavioral disturbance: Secondary | ICD-10-CM | POA: Diagnosis not present

## 2021-07-18 DIAGNOSIS — Z6821 Body mass index (BMI) 21.0-21.9, adult: Secondary | ICD-10-CM | POA: Diagnosis not present

## 2021-07-18 DIAGNOSIS — F0280 Dementia in other diseases classified elsewhere without behavioral disturbance: Secondary | ICD-10-CM | POA: Diagnosis not present

## 2021-07-18 DIAGNOSIS — I69354 Hemiplegia and hemiparesis following cerebral infarction affecting left non-dominant side: Secondary | ICD-10-CM | POA: Diagnosis not present

## 2021-07-18 DIAGNOSIS — G309 Alzheimer's disease, unspecified: Secondary | ICD-10-CM | POA: Diagnosis not present

## 2021-07-18 DIAGNOSIS — E43 Unspecified severe protein-calorie malnutrition: Secondary | ICD-10-CM | POA: Diagnosis not present

## 2021-07-18 DIAGNOSIS — I10 Essential (primary) hypertension: Secondary | ICD-10-CM | POA: Diagnosis not present

## 2021-07-21 DIAGNOSIS — G309 Alzheimer's disease, unspecified: Secondary | ICD-10-CM | POA: Diagnosis not present

## 2021-07-21 DIAGNOSIS — I10 Essential (primary) hypertension: Secondary | ICD-10-CM | POA: Diagnosis not present

## 2021-07-21 DIAGNOSIS — Z6821 Body mass index (BMI) 21.0-21.9, adult: Secondary | ICD-10-CM | POA: Diagnosis not present

## 2021-07-21 DIAGNOSIS — F0280 Dementia in other diseases classified elsewhere without behavioral disturbance: Secondary | ICD-10-CM | POA: Diagnosis not present

## 2021-07-21 DIAGNOSIS — I69354 Hemiplegia and hemiparesis following cerebral infarction affecting left non-dominant side: Secondary | ICD-10-CM | POA: Diagnosis not present

## 2021-07-21 DIAGNOSIS — E43 Unspecified severe protein-calorie malnutrition: Secondary | ICD-10-CM | POA: Diagnosis not present

## 2021-07-22 DIAGNOSIS — F0280 Dementia in other diseases classified elsewhere without behavioral disturbance: Secondary | ICD-10-CM | POA: Diagnosis not present

## 2021-07-22 DIAGNOSIS — Z6821 Body mass index (BMI) 21.0-21.9, adult: Secondary | ICD-10-CM | POA: Diagnosis not present

## 2021-07-22 DIAGNOSIS — I10 Essential (primary) hypertension: Secondary | ICD-10-CM | POA: Diagnosis not present

## 2021-07-22 DIAGNOSIS — I69354 Hemiplegia and hemiparesis following cerebral infarction affecting left non-dominant side: Secondary | ICD-10-CM | POA: Diagnosis not present

## 2021-07-22 DIAGNOSIS — E43 Unspecified severe protein-calorie malnutrition: Secondary | ICD-10-CM | POA: Diagnosis not present

## 2021-07-22 DIAGNOSIS — G309 Alzheimer's disease, unspecified: Secondary | ICD-10-CM | POA: Diagnosis not present

## 2021-07-25 DIAGNOSIS — I69354 Hemiplegia and hemiparesis following cerebral infarction affecting left non-dominant side: Secondary | ICD-10-CM | POA: Diagnosis not present

## 2021-07-25 DIAGNOSIS — G309 Alzheimer's disease, unspecified: Secondary | ICD-10-CM | POA: Diagnosis not present

## 2021-07-25 DIAGNOSIS — Z6821 Body mass index (BMI) 21.0-21.9, adult: Secondary | ICD-10-CM | POA: Diagnosis not present

## 2021-07-25 DIAGNOSIS — F0280 Dementia in other diseases classified elsewhere without behavioral disturbance: Secondary | ICD-10-CM | POA: Diagnosis not present

## 2021-07-25 DIAGNOSIS — I10 Essential (primary) hypertension: Secondary | ICD-10-CM | POA: Diagnosis not present

## 2021-07-25 DIAGNOSIS — E43 Unspecified severe protein-calorie malnutrition: Secondary | ICD-10-CM | POA: Diagnosis not present

## 2021-07-28 DIAGNOSIS — Z6821 Body mass index (BMI) 21.0-21.9, adult: Secondary | ICD-10-CM | POA: Diagnosis not present

## 2021-07-28 DIAGNOSIS — E43 Unspecified severe protein-calorie malnutrition: Secondary | ICD-10-CM | POA: Diagnosis not present

## 2021-07-28 DIAGNOSIS — I69354 Hemiplegia and hemiparesis following cerebral infarction affecting left non-dominant side: Secondary | ICD-10-CM | POA: Diagnosis not present

## 2021-07-28 DIAGNOSIS — G309 Alzheimer's disease, unspecified: Secondary | ICD-10-CM | POA: Diagnosis not present

## 2021-07-28 DIAGNOSIS — F0280 Dementia in other diseases classified elsewhere without behavioral disturbance: Secondary | ICD-10-CM | POA: Diagnosis not present

## 2021-07-28 DIAGNOSIS — I10 Essential (primary) hypertension: Secondary | ICD-10-CM | POA: Diagnosis not present

## 2021-07-29 DIAGNOSIS — F0280 Dementia in other diseases classified elsewhere without behavioral disturbance: Secondary | ICD-10-CM | POA: Diagnosis not present

## 2021-07-29 DIAGNOSIS — G309 Alzheimer's disease, unspecified: Secondary | ICD-10-CM | POA: Diagnosis not present

## 2021-07-29 DIAGNOSIS — I69354 Hemiplegia and hemiparesis following cerebral infarction affecting left non-dominant side: Secondary | ICD-10-CM | POA: Diagnosis not present

## 2021-07-29 DIAGNOSIS — E43 Unspecified severe protein-calorie malnutrition: Secondary | ICD-10-CM | POA: Diagnosis not present

## 2021-07-29 DIAGNOSIS — Z6821 Body mass index (BMI) 21.0-21.9, adult: Secondary | ICD-10-CM | POA: Diagnosis not present

## 2021-07-29 DIAGNOSIS — I10 Essential (primary) hypertension: Secondary | ICD-10-CM | POA: Diagnosis not present

## 2021-08-01 DIAGNOSIS — I69354 Hemiplegia and hemiparesis following cerebral infarction affecting left non-dominant side: Secondary | ICD-10-CM | POA: Diagnosis not present

## 2021-08-01 DIAGNOSIS — F0280 Dementia in other diseases classified elsewhere without behavioral disturbance: Secondary | ICD-10-CM | POA: Diagnosis not present

## 2021-08-01 DIAGNOSIS — I10 Essential (primary) hypertension: Secondary | ICD-10-CM | POA: Diagnosis not present

## 2021-08-01 DIAGNOSIS — Z6821 Body mass index (BMI) 21.0-21.9, adult: Secondary | ICD-10-CM | POA: Diagnosis not present

## 2021-08-01 DIAGNOSIS — E43 Unspecified severe protein-calorie malnutrition: Secondary | ICD-10-CM | POA: Diagnosis not present

## 2021-08-01 DIAGNOSIS — G309 Alzheimer's disease, unspecified: Secondary | ICD-10-CM | POA: Diagnosis not present

## 2021-08-04 DIAGNOSIS — F0280 Dementia in other diseases classified elsewhere without behavioral disturbance: Secondary | ICD-10-CM | POA: Diagnosis not present

## 2021-08-04 DIAGNOSIS — G309 Alzheimer's disease, unspecified: Secondary | ICD-10-CM | POA: Diagnosis not present

## 2021-08-04 DIAGNOSIS — E43 Unspecified severe protein-calorie malnutrition: Secondary | ICD-10-CM | POA: Diagnosis not present

## 2021-08-04 DIAGNOSIS — I69354 Hemiplegia and hemiparesis following cerebral infarction affecting left non-dominant side: Secondary | ICD-10-CM | POA: Diagnosis not present

## 2021-08-04 DIAGNOSIS — Z6821 Body mass index (BMI) 21.0-21.9, adult: Secondary | ICD-10-CM | POA: Diagnosis not present

## 2021-08-04 DIAGNOSIS — I10 Essential (primary) hypertension: Secondary | ICD-10-CM | POA: Diagnosis not present

## 2021-08-05 DIAGNOSIS — I10 Essential (primary) hypertension: Secondary | ICD-10-CM | POA: Diagnosis not present

## 2021-08-05 DIAGNOSIS — E43 Unspecified severe protein-calorie malnutrition: Secondary | ICD-10-CM | POA: Diagnosis not present

## 2021-08-05 DIAGNOSIS — Z6821 Body mass index (BMI) 21.0-21.9, adult: Secondary | ICD-10-CM | POA: Diagnosis not present

## 2021-08-05 DIAGNOSIS — I69354 Hemiplegia and hemiparesis following cerebral infarction affecting left non-dominant side: Secondary | ICD-10-CM | POA: Diagnosis not present

## 2021-08-05 DIAGNOSIS — F0280 Dementia in other diseases classified elsewhere without behavioral disturbance: Secondary | ICD-10-CM | POA: Diagnosis not present

## 2021-08-05 DIAGNOSIS — G309 Alzheimer's disease, unspecified: Secondary | ICD-10-CM | POA: Diagnosis not present

## 2021-08-06 DIAGNOSIS — Z853 Personal history of malignant neoplasm of breast: Secondary | ICD-10-CM | POA: Diagnosis not present

## 2021-08-06 DIAGNOSIS — F0280 Dementia in other diseases classified elsewhere without behavioral disturbance: Secondary | ICD-10-CM | POA: Diagnosis not present

## 2021-08-06 DIAGNOSIS — I10 Essential (primary) hypertension: Secondary | ICD-10-CM | POA: Diagnosis not present

## 2021-08-06 DIAGNOSIS — G309 Alzheimer's disease, unspecified: Secondary | ICD-10-CM | POA: Diagnosis not present

## 2021-08-06 DIAGNOSIS — Z6821 Body mass index (BMI) 21.0-21.9, adult: Secondary | ICD-10-CM | POA: Diagnosis not present

## 2021-08-06 DIAGNOSIS — E43 Unspecified severe protein-calorie malnutrition: Secondary | ICD-10-CM | POA: Diagnosis not present

## 2021-08-06 DIAGNOSIS — I69354 Hemiplegia and hemiparesis following cerebral infarction affecting left non-dominant side: Secondary | ICD-10-CM | POA: Diagnosis not present

## 2021-08-06 DIAGNOSIS — R634 Abnormal weight loss: Secondary | ICD-10-CM | POA: Diagnosis not present

## 2021-08-08 DIAGNOSIS — F0280 Dementia in other diseases classified elsewhere without behavioral disturbance: Secondary | ICD-10-CM | POA: Diagnosis not present

## 2021-08-08 DIAGNOSIS — I69354 Hemiplegia and hemiparesis following cerebral infarction affecting left non-dominant side: Secondary | ICD-10-CM | POA: Diagnosis not present

## 2021-08-08 DIAGNOSIS — G309 Alzheimer's disease, unspecified: Secondary | ICD-10-CM | POA: Diagnosis not present

## 2021-08-08 DIAGNOSIS — Z6821 Body mass index (BMI) 21.0-21.9, adult: Secondary | ICD-10-CM | POA: Diagnosis not present

## 2021-08-08 DIAGNOSIS — E43 Unspecified severe protein-calorie malnutrition: Secondary | ICD-10-CM | POA: Diagnosis not present

## 2021-08-08 DIAGNOSIS — I10 Essential (primary) hypertension: Secondary | ICD-10-CM | POA: Diagnosis not present

## 2021-08-11 DIAGNOSIS — G309 Alzheimer's disease, unspecified: Secondary | ICD-10-CM | POA: Diagnosis not present

## 2021-08-11 DIAGNOSIS — I10 Essential (primary) hypertension: Secondary | ICD-10-CM | POA: Diagnosis not present

## 2021-08-11 DIAGNOSIS — Z6821 Body mass index (BMI) 21.0-21.9, adult: Secondary | ICD-10-CM | POA: Diagnosis not present

## 2021-08-11 DIAGNOSIS — E43 Unspecified severe protein-calorie malnutrition: Secondary | ICD-10-CM | POA: Diagnosis not present

## 2021-08-11 DIAGNOSIS — F0280 Dementia in other diseases classified elsewhere without behavioral disturbance: Secondary | ICD-10-CM | POA: Diagnosis not present

## 2021-08-11 DIAGNOSIS — I69354 Hemiplegia and hemiparesis following cerebral infarction affecting left non-dominant side: Secondary | ICD-10-CM | POA: Diagnosis not present

## 2021-08-12 DIAGNOSIS — E43 Unspecified severe protein-calorie malnutrition: Secondary | ICD-10-CM | POA: Diagnosis not present

## 2021-08-12 DIAGNOSIS — I10 Essential (primary) hypertension: Secondary | ICD-10-CM | POA: Diagnosis not present

## 2021-08-12 DIAGNOSIS — F0280 Dementia in other diseases classified elsewhere without behavioral disturbance: Secondary | ICD-10-CM | POA: Diagnosis not present

## 2021-08-12 DIAGNOSIS — I69354 Hemiplegia and hemiparesis following cerebral infarction affecting left non-dominant side: Secondary | ICD-10-CM | POA: Diagnosis not present

## 2021-08-12 DIAGNOSIS — G309 Alzheimer's disease, unspecified: Secondary | ICD-10-CM | POA: Diagnosis not present

## 2021-08-12 DIAGNOSIS — Z6821 Body mass index (BMI) 21.0-21.9, adult: Secondary | ICD-10-CM | POA: Diagnosis not present

## 2021-08-15 DIAGNOSIS — I69354 Hemiplegia and hemiparesis following cerebral infarction affecting left non-dominant side: Secondary | ICD-10-CM | POA: Diagnosis not present

## 2021-08-15 DIAGNOSIS — F0280 Dementia in other diseases classified elsewhere without behavioral disturbance: Secondary | ICD-10-CM | POA: Diagnosis not present

## 2021-08-15 DIAGNOSIS — Z6821 Body mass index (BMI) 21.0-21.9, adult: Secondary | ICD-10-CM | POA: Diagnosis not present

## 2021-08-15 DIAGNOSIS — E43 Unspecified severe protein-calorie malnutrition: Secondary | ICD-10-CM | POA: Diagnosis not present

## 2021-08-15 DIAGNOSIS — G309 Alzheimer's disease, unspecified: Secondary | ICD-10-CM | POA: Diagnosis not present

## 2021-08-15 DIAGNOSIS — I10 Essential (primary) hypertension: Secondary | ICD-10-CM | POA: Diagnosis not present

## 2021-08-18 DIAGNOSIS — E43 Unspecified severe protein-calorie malnutrition: Secondary | ICD-10-CM | POA: Diagnosis not present

## 2021-08-18 DIAGNOSIS — I69354 Hemiplegia and hemiparesis following cerebral infarction affecting left non-dominant side: Secondary | ICD-10-CM | POA: Diagnosis not present

## 2021-08-18 DIAGNOSIS — Z6821 Body mass index (BMI) 21.0-21.9, adult: Secondary | ICD-10-CM | POA: Diagnosis not present

## 2021-08-18 DIAGNOSIS — G309 Alzheimer's disease, unspecified: Secondary | ICD-10-CM | POA: Diagnosis not present

## 2021-08-18 DIAGNOSIS — I10 Essential (primary) hypertension: Secondary | ICD-10-CM | POA: Diagnosis not present

## 2021-08-18 DIAGNOSIS — F0280 Dementia in other diseases classified elsewhere without behavioral disturbance: Secondary | ICD-10-CM | POA: Diagnosis not present

## 2021-08-19 DIAGNOSIS — I10 Essential (primary) hypertension: Secondary | ICD-10-CM | POA: Diagnosis not present

## 2021-08-19 DIAGNOSIS — G309 Alzheimer's disease, unspecified: Secondary | ICD-10-CM | POA: Diagnosis not present

## 2021-08-19 DIAGNOSIS — I69354 Hemiplegia and hemiparesis following cerebral infarction affecting left non-dominant side: Secondary | ICD-10-CM | POA: Diagnosis not present

## 2021-08-19 DIAGNOSIS — E43 Unspecified severe protein-calorie malnutrition: Secondary | ICD-10-CM | POA: Diagnosis not present

## 2021-08-19 DIAGNOSIS — F0280 Dementia in other diseases classified elsewhere without behavioral disturbance: Secondary | ICD-10-CM | POA: Diagnosis not present

## 2021-08-19 DIAGNOSIS — Z6821 Body mass index (BMI) 21.0-21.9, adult: Secondary | ICD-10-CM | POA: Diagnosis not present

## 2021-08-22 DIAGNOSIS — G309 Alzheimer's disease, unspecified: Secondary | ICD-10-CM | POA: Diagnosis not present

## 2021-08-22 DIAGNOSIS — E43 Unspecified severe protein-calorie malnutrition: Secondary | ICD-10-CM | POA: Diagnosis not present

## 2021-08-22 DIAGNOSIS — I69354 Hemiplegia and hemiparesis following cerebral infarction affecting left non-dominant side: Secondary | ICD-10-CM | POA: Diagnosis not present

## 2021-08-22 DIAGNOSIS — F0280 Dementia in other diseases classified elsewhere without behavioral disturbance: Secondary | ICD-10-CM | POA: Diagnosis not present

## 2021-08-22 DIAGNOSIS — I10 Essential (primary) hypertension: Secondary | ICD-10-CM | POA: Diagnosis not present

## 2021-08-22 DIAGNOSIS — Z6821 Body mass index (BMI) 21.0-21.9, adult: Secondary | ICD-10-CM | POA: Diagnosis not present

## 2021-08-25 DIAGNOSIS — Z6821 Body mass index (BMI) 21.0-21.9, adult: Secondary | ICD-10-CM | POA: Diagnosis not present

## 2021-08-25 DIAGNOSIS — I10 Essential (primary) hypertension: Secondary | ICD-10-CM | POA: Diagnosis not present

## 2021-08-25 DIAGNOSIS — F0280 Dementia in other diseases classified elsewhere without behavioral disturbance: Secondary | ICD-10-CM | POA: Diagnosis not present

## 2021-08-25 DIAGNOSIS — E43 Unspecified severe protein-calorie malnutrition: Secondary | ICD-10-CM | POA: Diagnosis not present

## 2021-08-25 DIAGNOSIS — I69354 Hemiplegia and hemiparesis following cerebral infarction affecting left non-dominant side: Secondary | ICD-10-CM | POA: Diagnosis not present

## 2021-08-25 DIAGNOSIS — G309 Alzheimer's disease, unspecified: Secondary | ICD-10-CM | POA: Diagnosis not present

## 2021-08-26 DIAGNOSIS — F0280 Dementia in other diseases classified elsewhere without behavioral disturbance: Secondary | ICD-10-CM | POA: Diagnosis not present

## 2021-08-26 DIAGNOSIS — G309 Alzheimer's disease, unspecified: Secondary | ICD-10-CM | POA: Diagnosis not present

## 2021-08-26 DIAGNOSIS — E43 Unspecified severe protein-calorie malnutrition: Secondary | ICD-10-CM | POA: Diagnosis not present

## 2021-08-26 DIAGNOSIS — I10 Essential (primary) hypertension: Secondary | ICD-10-CM | POA: Diagnosis not present

## 2021-08-26 DIAGNOSIS — Z6821 Body mass index (BMI) 21.0-21.9, adult: Secondary | ICD-10-CM | POA: Diagnosis not present

## 2021-08-26 DIAGNOSIS — I69354 Hemiplegia and hemiparesis following cerebral infarction affecting left non-dominant side: Secondary | ICD-10-CM | POA: Diagnosis not present

## 2021-08-29 DIAGNOSIS — G309 Alzheimer's disease, unspecified: Secondary | ICD-10-CM | POA: Diagnosis not present

## 2021-08-29 DIAGNOSIS — I10 Essential (primary) hypertension: Secondary | ICD-10-CM | POA: Diagnosis not present

## 2021-08-29 DIAGNOSIS — E43 Unspecified severe protein-calorie malnutrition: Secondary | ICD-10-CM | POA: Diagnosis not present

## 2021-08-29 DIAGNOSIS — Z6821 Body mass index (BMI) 21.0-21.9, adult: Secondary | ICD-10-CM | POA: Diagnosis not present

## 2021-08-29 DIAGNOSIS — F0280 Dementia in other diseases classified elsewhere without behavioral disturbance: Secondary | ICD-10-CM | POA: Diagnosis not present

## 2021-08-29 DIAGNOSIS — I69354 Hemiplegia and hemiparesis following cerebral infarction affecting left non-dominant side: Secondary | ICD-10-CM | POA: Diagnosis not present

## 2021-09-01 DIAGNOSIS — Z6821 Body mass index (BMI) 21.0-21.9, adult: Secondary | ICD-10-CM | POA: Diagnosis not present

## 2021-09-01 DIAGNOSIS — G309 Alzheimer's disease, unspecified: Secondary | ICD-10-CM | POA: Diagnosis not present

## 2021-09-01 DIAGNOSIS — F0280 Dementia in other diseases classified elsewhere without behavioral disturbance: Secondary | ICD-10-CM | POA: Diagnosis not present

## 2021-09-01 DIAGNOSIS — E43 Unspecified severe protein-calorie malnutrition: Secondary | ICD-10-CM | POA: Diagnosis not present

## 2021-09-01 DIAGNOSIS — I69354 Hemiplegia and hemiparesis following cerebral infarction affecting left non-dominant side: Secondary | ICD-10-CM | POA: Diagnosis not present

## 2021-09-01 DIAGNOSIS — I10 Essential (primary) hypertension: Secondary | ICD-10-CM | POA: Diagnosis not present

## 2021-09-02 DIAGNOSIS — I69354 Hemiplegia and hemiparesis following cerebral infarction affecting left non-dominant side: Secondary | ICD-10-CM | POA: Diagnosis not present

## 2021-09-02 DIAGNOSIS — G309 Alzheimer's disease, unspecified: Secondary | ICD-10-CM | POA: Diagnosis not present

## 2021-09-02 DIAGNOSIS — E43 Unspecified severe protein-calorie malnutrition: Secondary | ICD-10-CM | POA: Diagnosis not present

## 2021-09-02 DIAGNOSIS — I10 Essential (primary) hypertension: Secondary | ICD-10-CM | POA: Diagnosis not present

## 2021-09-02 DIAGNOSIS — F0280 Dementia in other diseases classified elsewhere without behavioral disturbance: Secondary | ICD-10-CM | POA: Diagnosis not present

## 2021-09-02 DIAGNOSIS — Z6821 Body mass index (BMI) 21.0-21.9, adult: Secondary | ICD-10-CM | POA: Diagnosis not present

## 2021-09-05 DIAGNOSIS — E43 Unspecified severe protein-calorie malnutrition: Secondary | ICD-10-CM | POA: Diagnosis not present

## 2021-09-05 DIAGNOSIS — G309 Alzheimer's disease, unspecified: Secondary | ICD-10-CM | POA: Diagnosis not present

## 2021-09-05 DIAGNOSIS — I10 Essential (primary) hypertension: Secondary | ICD-10-CM | POA: Diagnosis not present

## 2021-09-05 DIAGNOSIS — Z6821 Body mass index (BMI) 21.0-21.9, adult: Secondary | ICD-10-CM | POA: Diagnosis not present

## 2021-09-05 DIAGNOSIS — F0280 Dementia in other diseases classified elsewhere without behavioral disturbance: Secondary | ICD-10-CM | POA: Diagnosis not present

## 2021-09-05 DIAGNOSIS — I69354 Hemiplegia and hemiparesis following cerebral infarction affecting left non-dominant side: Secondary | ICD-10-CM | POA: Diagnosis not present

## 2021-09-06 DIAGNOSIS — Z9981 Dependence on supplemental oxygen: Secondary | ICD-10-CM | POA: Diagnosis not present

## 2021-09-06 DIAGNOSIS — Z853 Personal history of malignant neoplasm of breast: Secondary | ICD-10-CM | POA: Diagnosis not present

## 2021-09-06 DIAGNOSIS — G309 Alzheimer's disease, unspecified: Secondary | ICD-10-CM | POA: Diagnosis not present

## 2021-09-06 DIAGNOSIS — I69354 Hemiplegia and hemiparesis following cerebral infarction affecting left non-dominant side: Secondary | ICD-10-CM | POA: Diagnosis not present

## 2021-09-06 DIAGNOSIS — F0280 Dementia in other diseases classified elsewhere without behavioral disturbance: Secondary | ICD-10-CM | POA: Diagnosis not present

## 2021-09-06 DIAGNOSIS — L89152 Pressure ulcer of sacral region, stage 2: Secondary | ICD-10-CM | POA: Diagnosis not present

## 2021-09-06 DIAGNOSIS — E43 Unspecified severe protein-calorie malnutrition: Secondary | ICD-10-CM | POA: Diagnosis not present

## 2021-09-06 DIAGNOSIS — I10 Essential (primary) hypertension: Secondary | ICD-10-CM | POA: Diagnosis not present

## 2021-09-06 DIAGNOSIS — R634 Abnormal weight loss: Secondary | ICD-10-CM | POA: Diagnosis not present

## 2021-09-06 DIAGNOSIS — Z6821 Body mass index (BMI) 21.0-21.9, adult: Secondary | ICD-10-CM | POA: Diagnosis not present

## 2021-09-08 DIAGNOSIS — I69354 Hemiplegia and hemiparesis following cerebral infarction affecting left non-dominant side: Secondary | ICD-10-CM | POA: Diagnosis not present

## 2021-09-08 DIAGNOSIS — I10 Essential (primary) hypertension: Secondary | ICD-10-CM | POA: Diagnosis not present

## 2021-09-08 DIAGNOSIS — G309 Alzheimer's disease, unspecified: Secondary | ICD-10-CM | POA: Diagnosis not present

## 2021-09-08 DIAGNOSIS — E43 Unspecified severe protein-calorie malnutrition: Secondary | ICD-10-CM | POA: Diagnosis not present

## 2021-09-08 DIAGNOSIS — Z6821 Body mass index (BMI) 21.0-21.9, adult: Secondary | ICD-10-CM | POA: Diagnosis not present

## 2021-09-08 DIAGNOSIS — F0280 Dementia in other diseases classified elsewhere without behavioral disturbance: Secondary | ICD-10-CM | POA: Diagnosis not present

## 2021-09-09 DIAGNOSIS — I10 Essential (primary) hypertension: Secondary | ICD-10-CM | POA: Diagnosis not present

## 2021-09-09 DIAGNOSIS — F0280 Dementia in other diseases classified elsewhere without behavioral disturbance: Secondary | ICD-10-CM | POA: Diagnosis not present

## 2021-09-09 DIAGNOSIS — I69354 Hemiplegia and hemiparesis following cerebral infarction affecting left non-dominant side: Secondary | ICD-10-CM | POA: Diagnosis not present

## 2021-09-09 DIAGNOSIS — G309 Alzheimer's disease, unspecified: Secondary | ICD-10-CM | POA: Diagnosis not present

## 2021-09-09 DIAGNOSIS — Z6821 Body mass index (BMI) 21.0-21.9, adult: Secondary | ICD-10-CM | POA: Diagnosis not present

## 2021-09-09 DIAGNOSIS — E43 Unspecified severe protein-calorie malnutrition: Secondary | ICD-10-CM | POA: Diagnosis not present

## 2021-09-10 DIAGNOSIS — G309 Alzheimer's disease, unspecified: Secondary | ICD-10-CM | POA: Diagnosis not present

## 2021-09-10 DIAGNOSIS — I69354 Hemiplegia and hemiparesis following cerebral infarction affecting left non-dominant side: Secondary | ICD-10-CM | POA: Diagnosis not present

## 2021-09-10 DIAGNOSIS — E43 Unspecified severe protein-calorie malnutrition: Secondary | ICD-10-CM | POA: Diagnosis not present

## 2021-09-10 DIAGNOSIS — F0280 Dementia in other diseases classified elsewhere without behavioral disturbance: Secondary | ICD-10-CM | POA: Diagnosis not present

## 2021-09-10 DIAGNOSIS — Z6821 Body mass index (BMI) 21.0-21.9, adult: Secondary | ICD-10-CM | POA: Diagnosis not present

## 2021-09-10 DIAGNOSIS — I10 Essential (primary) hypertension: Secondary | ICD-10-CM | POA: Diagnosis not present

## 2021-09-12 DIAGNOSIS — G309 Alzheimer's disease, unspecified: Secondary | ICD-10-CM | POA: Diagnosis not present

## 2021-09-12 DIAGNOSIS — I69354 Hemiplegia and hemiparesis following cerebral infarction affecting left non-dominant side: Secondary | ICD-10-CM | POA: Diagnosis not present

## 2021-09-12 DIAGNOSIS — F0280 Dementia in other diseases classified elsewhere without behavioral disturbance: Secondary | ICD-10-CM | POA: Diagnosis not present

## 2021-09-12 DIAGNOSIS — E43 Unspecified severe protein-calorie malnutrition: Secondary | ICD-10-CM | POA: Diagnosis not present

## 2021-09-12 DIAGNOSIS — Z6821 Body mass index (BMI) 21.0-21.9, adult: Secondary | ICD-10-CM | POA: Diagnosis not present

## 2021-09-12 DIAGNOSIS — I10 Essential (primary) hypertension: Secondary | ICD-10-CM | POA: Diagnosis not present

## 2021-09-15 DIAGNOSIS — G309 Alzheimer's disease, unspecified: Secondary | ICD-10-CM | POA: Diagnosis not present

## 2021-09-15 DIAGNOSIS — Z6821 Body mass index (BMI) 21.0-21.9, adult: Secondary | ICD-10-CM | POA: Diagnosis not present

## 2021-09-15 DIAGNOSIS — I69354 Hemiplegia and hemiparesis following cerebral infarction affecting left non-dominant side: Secondary | ICD-10-CM | POA: Diagnosis not present

## 2021-09-15 DIAGNOSIS — E43 Unspecified severe protein-calorie malnutrition: Secondary | ICD-10-CM | POA: Diagnosis not present

## 2021-09-15 DIAGNOSIS — F0280 Dementia in other diseases classified elsewhere without behavioral disturbance: Secondary | ICD-10-CM | POA: Diagnosis not present

## 2021-09-15 DIAGNOSIS — I10 Essential (primary) hypertension: Secondary | ICD-10-CM | POA: Diagnosis not present

## 2021-09-16 DIAGNOSIS — Z6821 Body mass index (BMI) 21.0-21.9, adult: Secondary | ICD-10-CM | POA: Diagnosis not present

## 2021-09-16 DIAGNOSIS — F0280 Dementia in other diseases classified elsewhere without behavioral disturbance: Secondary | ICD-10-CM | POA: Diagnosis not present

## 2021-09-16 DIAGNOSIS — I10 Essential (primary) hypertension: Secondary | ICD-10-CM | POA: Diagnosis not present

## 2021-09-16 DIAGNOSIS — I69354 Hemiplegia and hemiparesis following cerebral infarction affecting left non-dominant side: Secondary | ICD-10-CM | POA: Diagnosis not present

## 2021-09-16 DIAGNOSIS — G309 Alzheimer's disease, unspecified: Secondary | ICD-10-CM | POA: Diagnosis not present

## 2021-09-16 DIAGNOSIS — E43 Unspecified severe protein-calorie malnutrition: Secondary | ICD-10-CM | POA: Diagnosis not present

## 2021-09-19 DIAGNOSIS — Z6821 Body mass index (BMI) 21.0-21.9, adult: Secondary | ICD-10-CM | POA: Diagnosis not present

## 2021-09-19 DIAGNOSIS — E43 Unspecified severe protein-calorie malnutrition: Secondary | ICD-10-CM | POA: Diagnosis not present

## 2021-09-19 DIAGNOSIS — I10 Essential (primary) hypertension: Secondary | ICD-10-CM | POA: Diagnosis not present

## 2021-09-19 DIAGNOSIS — G309 Alzheimer's disease, unspecified: Secondary | ICD-10-CM | POA: Diagnosis not present

## 2021-09-19 DIAGNOSIS — I69354 Hemiplegia and hemiparesis following cerebral infarction affecting left non-dominant side: Secondary | ICD-10-CM | POA: Diagnosis not present

## 2021-09-19 DIAGNOSIS — F0280 Dementia in other diseases classified elsewhere without behavioral disturbance: Secondary | ICD-10-CM | POA: Diagnosis not present

## 2021-09-22 DIAGNOSIS — I69354 Hemiplegia and hemiparesis following cerebral infarction affecting left non-dominant side: Secondary | ICD-10-CM | POA: Diagnosis not present

## 2021-09-22 DIAGNOSIS — F0280 Dementia in other diseases classified elsewhere without behavioral disturbance: Secondary | ICD-10-CM | POA: Diagnosis not present

## 2021-09-22 DIAGNOSIS — I10 Essential (primary) hypertension: Secondary | ICD-10-CM | POA: Diagnosis not present

## 2021-09-22 DIAGNOSIS — G309 Alzheimer's disease, unspecified: Secondary | ICD-10-CM | POA: Diagnosis not present

## 2021-09-22 DIAGNOSIS — E43 Unspecified severe protein-calorie malnutrition: Secondary | ICD-10-CM | POA: Diagnosis not present

## 2021-09-22 DIAGNOSIS — Z6821 Body mass index (BMI) 21.0-21.9, adult: Secondary | ICD-10-CM | POA: Diagnosis not present

## 2021-09-23 DIAGNOSIS — E43 Unspecified severe protein-calorie malnutrition: Secondary | ICD-10-CM | POA: Diagnosis not present

## 2021-09-23 DIAGNOSIS — I10 Essential (primary) hypertension: Secondary | ICD-10-CM | POA: Diagnosis not present

## 2021-09-23 DIAGNOSIS — F0280 Dementia in other diseases classified elsewhere without behavioral disturbance: Secondary | ICD-10-CM | POA: Diagnosis not present

## 2021-09-23 DIAGNOSIS — G309 Alzheimer's disease, unspecified: Secondary | ICD-10-CM | POA: Diagnosis not present

## 2021-09-23 DIAGNOSIS — Z6821 Body mass index (BMI) 21.0-21.9, adult: Secondary | ICD-10-CM | POA: Diagnosis not present

## 2021-09-23 DIAGNOSIS — I69354 Hemiplegia and hemiparesis following cerebral infarction affecting left non-dominant side: Secondary | ICD-10-CM | POA: Diagnosis not present

## 2021-09-26 DIAGNOSIS — F0280 Dementia in other diseases classified elsewhere without behavioral disturbance: Secondary | ICD-10-CM | POA: Diagnosis not present

## 2021-09-26 DIAGNOSIS — I10 Essential (primary) hypertension: Secondary | ICD-10-CM | POA: Diagnosis not present

## 2021-09-26 DIAGNOSIS — G309 Alzheimer's disease, unspecified: Secondary | ICD-10-CM | POA: Diagnosis not present

## 2021-09-26 DIAGNOSIS — E43 Unspecified severe protein-calorie malnutrition: Secondary | ICD-10-CM | POA: Diagnosis not present

## 2021-09-26 DIAGNOSIS — Z6821 Body mass index (BMI) 21.0-21.9, adult: Secondary | ICD-10-CM | POA: Diagnosis not present

## 2021-09-26 DIAGNOSIS — I69354 Hemiplegia and hemiparesis following cerebral infarction affecting left non-dominant side: Secondary | ICD-10-CM | POA: Diagnosis not present

## 2021-09-29 DIAGNOSIS — Z6821 Body mass index (BMI) 21.0-21.9, adult: Secondary | ICD-10-CM | POA: Diagnosis not present

## 2021-09-29 DIAGNOSIS — I10 Essential (primary) hypertension: Secondary | ICD-10-CM | POA: Diagnosis not present

## 2021-09-29 DIAGNOSIS — F0280 Dementia in other diseases classified elsewhere without behavioral disturbance: Secondary | ICD-10-CM | POA: Diagnosis not present

## 2021-09-29 DIAGNOSIS — G309 Alzheimer's disease, unspecified: Secondary | ICD-10-CM | POA: Diagnosis not present

## 2021-09-29 DIAGNOSIS — E43 Unspecified severe protein-calorie malnutrition: Secondary | ICD-10-CM | POA: Diagnosis not present

## 2021-09-29 DIAGNOSIS — I69354 Hemiplegia and hemiparesis following cerebral infarction affecting left non-dominant side: Secondary | ICD-10-CM | POA: Diagnosis not present

## 2021-09-30 DIAGNOSIS — F0280 Dementia in other diseases classified elsewhere without behavioral disturbance: Secondary | ICD-10-CM | POA: Diagnosis not present

## 2021-09-30 DIAGNOSIS — I69354 Hemiplegia and hemiparesis following cerebral infarction affecting left non-dominant side: Secondary | ICD-10-CM | POA: Diagnosis not present

## 2021-09-30 DIAGNOSIS — E43 Unspecified severe protein-calorie malnutrition: Secondary | ICD-10-CM | POA: Diagnosis not present

## 2021-09-30 DIAGNOSIS — I10 Essential (primary) hypertension: Secondary | ICD-10-CM | POA: Diagnosis not present

## 2021-09-30 DIAGNOSIS — Z6821 Body mass index (BMI) 21.0-21.9, adult: Secondary | ICD-10-CM | POA: Diagnosis not present

## 2021-09-30 DIAGNOSIS — G309 Alzheimer's disease, unspecified: Secondary | ICD-10-CM | POA: Diagnosis not present

## 2021-10-03 DIAGNOSIS — I10 Essential (primary) hypertension: Secondary | ICD-10-CM | POA: Diagnosis not present

## 2021-10-03 DIAGNOSIS — G309 Alzheimer's disease, unspecified: Secondary | ICD-10-CM | POA: Diagnosis not present

## 2021-10-03 DIAGNOSIS — E43 Unspecified severe protein-calorie malnutrition: Secondary | ICD-10-CM | POA: Diagnosis not present

## 2021-10-03 DIAGNOSIS — Z6821 Body mass index (BMI) 21.0-21.9, adult: Secondary | ICD-10-CM | POA: Diagnosis not present

## 2021-10-03 DIAGNOSIS — F0280 Dementia in other diseases classified elsewhere without behavioral disturbance: Secondary | ICD-10-CM | POA: Diagnosis not present

## 2021-10-03 DIAGNOSIS — I69354 Hemiplegia and hemiparesis following cerebral infarction affecting left non-dominant side: Secondary | ICD-10-CM | POA: Diagnosis not present

## 2021-10-06 DIAGNOSIS — Z853 Personal history of malignant neoplasm of breast: Secondary | ICD-10-CM | POA: Diagnosis not present

## 2021-10-06 DIAGNOSIS — L89152 Pressure ulcer of sacral region, stage 2: Secondary | ICD-10-CM | POA: Diagnosis not present

## 2021-10-06 DIAGNOSIS — Z9981 Dependence on supplemental oxygen: Secondary | ICD-10-CM | POA: Diagnosis not present

## 2021-10-06 DIAGNOSIS — I10 Essential (primary) hypertension: Secondary | ICD-10-CM | POA: Diagnosis not present

## 2021-10-06 DIAGNOSIS — E43 Unspecified severe protein-calorie malnutrition: Secondary | ICD-10-CM | POA: Diagnosis not present

## 2021-10-06 DIAGNOSIS — F0280 Dementia in other diseases classified elsewhere without behavioral disturbance: Secondary | ICD-10-CM | POA: Diagnosis not present

## 2021-10-06 DIAGNOSIS — Z6821 Body mass index (BMI) 21.0-21.9, adult: Secondary | ICD-10-CM | POA: Diagnosis not present

## 2021-10-06 DIAGNOSIS — I69354 Hemiplegia and hemiparesis following cerebral infarction affecting left non-dominant side: Secondary | ICD-10-CM | POA: Diagnosis not present

## 2021-10-06 DIAGNOSIS — G309 Alzheimer's disease, unspecified: Secondary | ICD-10-CM | POA: Diagnosis not present

## 2021-10-06 DIAGNOSIS — R634 Abnormal weight loss: Secondary | ICD-10-CM | POA: Diagnosis not present

## 2021-10-07 DIAGNOSIS — I10 Essential (primary) hypertension: Secondary | ICD-10-CM | POA: Diagnosis not present

## 2021-10-07 DIAGNOSIS — G309 Alzheimer's disease, unspecified: Secondary | ICD-10-CM | POA: Diagnosis not present

## 2021-10-07 DIAGNOSIS — F0280 Dementia in other diseases classified elsewhere without behavioral disturbance: Secondary | ICD-10-CM | POA: Diagnosis not present

## 2021-10-07 DIAGNOSIS — E43 Unspecified severe protein-calorie malnutrition: Secondary | ICD-10-CM | POA: Diagnosis not present

## 2021-10-07 DIAGNOSIS — I69354 Hemiplegia and hemiparesis following cerebral infarction affecting left non-dominant side: Secondary | ICD-10-CM | POA: Diagnosis not present

## 2021-10-07 DIAGNOSIS — Z6821 Body mass index (BMI) 21.0-21.9, adult: Secondary | ICD-10-CM | POA: Diagnosis not present

## 2021-10-10 DIAGNOSIS — G309 Alzheimer's disease, unspecified: Secondary | ICD-10-CM | POA: Diagnosis not present

## 2021-10-10 DIAGNOSIS — F0280 Dementia in other diseases classified elsewhere without behavioral disturbance: Secondary | ICD-10-CM | POA: Diagnosis not present

## 2021-10-10 DIAGNOSIS — I69354 Hemiplegia and hemiparesis following cerebral infarction affecting left non-dominant side: Secondary | ICD-10-CM | POA: Diagnosis not present

## 2021-10-10 DIAGNOSIS — Z6821 Body mass index (BMI) 21.0-21.9, adult: Secondary | ICD-10-CM | POA: Diagnosis not present

## 2021-10-10 DIAGNOSIS — E43 Unspecified severe protein-calorie malnutrition: Secondary | ICD-10-CM | POA: Diagnosis not present

## 2021-10-10 DIAGNOSIS — I10 Essential (primary) hypertension: Secondary | ICD-10-CM | POA: Diagnosis not present

## 2021-10-14 DIAGNOSIS — G309 Alzheimer's disease, unspecified: Secondary | ICD-10-CM | POA: Diagnosis not present

## 2021-10-14 DIAGNOSIS — E43 Unspecified severe protein-calorie malnutrition: Secondary | ICD-10-CM | POA: Diagnosis not present

## 2021-10-14 DIAGNOSIS — Z6821 Body mass index (BMI) 21.0-21.9, adult: Secondary | ICD-10-CM | POA: Diagnosis not present

## 2021-10-14 DIAGNOSIS — F0280 Dementia in other diseases classified elsewhere without behavioral disturbance: Secondary | ICD-10-CM | POA: Diagnosis not present

## 2021-10-14 DIAGNOSIS — I69354 Hemiplegia and hemiparesis following cerebral infarction affecting left non-dominant side: Secondary | ICD-10-CM | POA: Diagnosis not present

## 2021-10-14 DIAGNOSIS — I10 Essential (primary) hypertension: Secondary | ICD-10-CM | POA: Diagnosis not present

## 2021-10-15 DIAGNOSIS — Z6821 Body mass index (BMI) 21.0-21.9, adult: Secondary | ICD-10-CM | POA: Diagnosis not present

## 2021-10-15 DIAGNOSIS — G309 Alzheimer's disease, unspecified: Secondary | ICD-10-CM | POA: Diagnosis not present

## 2021-10-15 DIAGNOSIS — I69354 Hemiplegia and hemiparesis following cerebral infarction affecting left non-dominant side: Secondary | ICD-10-CM | POA: Diagnosis not present

## 2021-10-15 DIAGNOSIS — F0280 Dementia in other diseases classified elsewhere without behavioral disturbance: Secondary | ICD-10-CM | POA: Diagnosis not present

## 2021-10-15 DIAGNOSIS — I10 Essential (primary) hypertension: Secondary | ICD-10-CM | POA: Diagnosis not present

## 2021-10-15 DIAGNOSIS — E43 Unspecified severe protein-calorie malnutrition: Secondary | ICD-10-CM | POA: Diagnosis not present

## 2021-10-16 DIAGNOSIS — I10 Essential (primary) hypertension: Secondary | ICD-10-CM | POA: Diagnosis not present

## 2021-10-16 DIAGNOSIS — I69354 Hemiplegia and hemiparesis following cerebral infarction affecting left non-dominant side: Secondary | ICD-10-CM | POA: Diagnosis not present

## 2021-10-16 DIAGNOSIS — G309 Alzheimer's disease, unspecified: Secondary | ICD-10-CM | POA: Diagnosis not present

## 2021-10-16 DIAGNOSIS — E43 Unspecified severe protein-calorie malnutrition: Secondary | ICD-10-CM | POA: Diagnosis not present

## 2021-10-16 DIAGNOSIS — Z6821 Body mass index (BMI) 21.0-21.9, adult: Secondary | ICD-10-CM | POA: Diagnosis not present

## 2021-10-16 DIAGNOSIS — F0280 Dementia in other diseases classified elsewhere without behavioral disturbance: Secondary | ICD-10-CM | POA: Diagnosis not present

## 2021-10-17 DIAGNOSIS — I10 Essential (primary) hypertension: Secondary | ICD-10-CM | POA: Diagnosis not present

## 2021-10-17 DIAGNOSIS — F0280 Dementia in other diseases classified elsewhere without behavioral disturbance: Secondary | ICD-10-CM | POA: Diagnosis not present

## 2021-10-17 DIAGNOSIS — E43 Unspecified severe protein-calorie malnutrition: Secondary | ICD-10-CM | POA: Diagnosis not present

## 2021-10-17 DIAGNOSIS — Z6821 Body mass index (BMI) 21.0-21.9, adult: Secondary | ICD-10-CM | POA: Diagnosis not present

## 2021-10-17 DIAGNOSIS — G309 Alzheimer's disease, unspecified: Secondary | ICD-10-CM | POA: Diagnosis not present

## 2021-10-17 DIAGNOSIS — I69354 Hemiplegia and hemiparesis following cerebral infarction affecting left non-dominant side: Secondary | ICD-10-CM | POA: Diagnosis not present

## 2021-10-18 DIAGNOSIS — E43 Unspecified severe protein-calorie malnutrition: Secondary | ICD-10-CM | POA: Diagnosis not present

## 2021-10-18 DIAGNOSIS — Z6821 Body mass index (BMI) 21.0-21.9, adult: Secondary | ICD-10-CM | POA: Diagnosis not present

## 2021-10-18 DIAGNOSIS — I69354 Hemiplegia and hemiparesis following cerebral infarction affecting left non-dominant side: Secondary | ICD-10-CM | POA: Diagnosis not present

## 2021-10-18 DIAGNOSIS — I10 Essential (primary) hypertension: Secondary | ICD-10-CM | POA: Diagnosis not present

## 2021-10-18 DIAGNOSIS — G309 Alzheimer's disease, unspecified: Secondary | ICD-10-CM | POA: Diagnosis not present

## 2021-10-18 DIAGNOSIS — F0280 Dementia in other diseases classified elsewhere without behavioral disturbance: Secondary | ICD-10-CM | POA: Diagnosis not present

## 2021-10-19 DIAGNOSIS — G309 Alzheimer's disease, unspecified: Secondary | ICD-10-CM | POA: Diagnosis not present

## 2021-10-19 DIAGNOSIS — Z6821 Body mass index (BMI) 21.0-21.9, adult: Secondary | ICD-10-CM | POA: Diagnosis not present

## 2021-10-19 DIAGNOSIS — I69354 Hemiplegia and hemiparesis following cerebral infarction affecting left non-dominant side: Secondary | ICD-10-CM | POA: Diagnosis not present

## 2021-10-19 DIAGNOSIS — F0280 Dementia in other diseases classified elsewhere without behavioral disturbance: Secondary | ICD-10-CM | POA: Diagnosis not present

## 2021-10-19 DIAGNOSIS — E43 Unspecified severe protein-calorie malnutrition: Secondary | ICD-10-CM | POA: Diagnosis not present

## 2021-10-19 DIAGNOSIS — I10 Essential (primary) hypertension: Secondary | ICD-10-CM | POA: Diagnosis not present

## 2021-10-20 DIAGNOSIS — I69354 Hemiplegia and hemiparesis following cerebral infarction affecting left non-dominant side: Secondary | ICD-10-CM | POA: Diagnosis not present

## 2021-10-20 DIAGNOSIS — G309 Alzheimer's disease, unspecified: Secondary | ICD-10-CM | POA: Diagnosis not present

## 2021-10-20 DIAGNOSIS — E43 Unspecified severe protein-calorie malnutrition: Secondary | ICD-10-CM | POA: Diagnosis not present

## 2021-10-20 DIAGNOSIS — F0280 Dementia in other diseases classified elsewhere without behavioral disturbance: Secondary | ICD-10-CM | POA: Diagnosis not present

## 2021-10-20 DIAGNOSIS — I10 Essential (primary) hypertension: Secondary | ICD-10-CM | POA: Diagnosis not present

## 2021-10-20 DIAGNOSIS — Z6821 Body mass index (BMI) 21.0-21.9, adult: Secondary | ICD-10-CM | POA: Diagnosis not present

## 2021-10-21 DIAGNOSIS — Z6821 Body mass index (BMI) 21.0-21.9, adult: Secondary | ICD-10-CM | POA: Diagnosis not present

## 2021-10-21 DIAGNOSIS — G309 Alzheimer's disease, unspecified: Secondary | ICD-10-CM | POA: Diagnosis not present

## 2021-10-21 DIAGNOSIS — E43 Unspecified severe protein-calorie malnutrition: Secondary | ICD-10-CM | POA: Diagnosis not present

## 2021-10-21 DIAGNOSIS — I69354 Hemiplegia and hemiparesis following cerebral infarction affecting left non-dominant side: Secondary | ICD-10-CM | POA: Diagnosis not present

## 2021-10-21 DIAGNOSIS — I10 Essential (primary) hypertension: Secondary | ICD-10-CM | POA: Diagnosis not present

## 2021-10-21 DIAGNOSIS — F0280 Dementia in other diseases classified elsewhere without behavioral disturbance: Secondary | ICD-10-CM | POA: Diagnosis not present

## 2021-10-24 DIAGNOSIS — E43 Unspecified severe protein-calorie malnutrition: Secondary | ICD-10-CM | POA: Diagnosis not present

## 2021-10-24 DIAGNOSIS — F0280 Dementia in other diseases classified elsewhere without behavioral disturbance: Secondary | ICD-10-CM | POA: Diagnosis not present

## 2021-10-24 DIAGNOSIS — I10 Essential (primary) hypertension: Secondary | ICD-10-CM | POA: Diagnosis not present

## 2021-10-24 DIAGNOSIS — I69354 Hemiplegia and hemiparesis following cerebral infarction affecting left non-dominant side: Secondary | ICD-10-CM | POA: Diagnosis not present

## 2021-10-24 DIAGNOSIS — G309 Alzheimer's disease, unspecified: Secondary | ICD-10-CM | POA: Diagnosis not present

## 2021-10-24 DIAGNOSIS — Z6821 Body mass index (BMI) 21.0-21.9, adult: Secondary | ICD-10-CM | POA: Diagnosis not present

## 2021-10-27 DIAGNOSIS — I69354 Hemiplegia and hemiparesis following cerebral infarction affecting left non-dominant side: Secondary | ICD-10-CM | POA: Diagnosis not present

## 2021-10-27 DIAGNOSIS — F0280 Dementia in other diseases classified elsewhere without behavioral disturbance: Secondary | ICD-10-CM | POA: Diagnosis not present

## 2021-10-27 DIAGNOSIS — G309 Alzheimer's disease, unspecified: Secondary | ICD-10-CM | POA: Diagnosis not present

## 2021-10-27 DIAGNOSIS — I10 Essential (primary) hypertension: Secondary | ICD-10-CM | POA: Diagnosis not present

## 2021-10-27 DIAGNOSIS — E43 Unspecified severe protein-calorie malnutrition: Secondary | ICD-10-CM | POA: Diagnosis not present

## 2021-10-27 DIAGNOSIS — Z6821 Body mass index (BMI) 21.0-21.9, adult: Secondary | ICD-10-CM | POA: Diagnosis not present

## 2021-10-28 DIAGNOSIS — I10 Essential (primary) hypertension: Secondary | ICD-10-CM | POA: Diagnosis not present

## 2021-10-28 DIAGNOSIS — I69354 Hemiplegia and hemiparesis following cerebral infarction affecting left non-dominant side: Secondary | ICD-10-CM | POA: Diagnosis not present

## 2021-10-28 DIAGNOSIS — G309 Alzheimer's disease, unspecified: Secondary | ICD-10-CM | POA: Diagnosis not present

## 2021-10-28 DIAGNOSIS — Z6821 Body mass index (BMI) 21.0-21.9, adult: Secondary | ICD-10-CM | POA: Diagnosis not present

## 2021-10-28 DIAGNOSIS — E43 Unspecified severe protein-calorie malnutrition: Secondary | ICD-10-CM | POA: Diagnosis not present

## 2021-10-28 DIAGNOSIS — F0280 Dementia in other diseases classified elsewhere without behavioral disturbance: Secondary | ICD-10-CM | POA: Diagnosis not present

## 2021-10-31 DIAGNOSIS — F0280 Dementia in other diseases classified elsewhere without behavioral disturbance: Secondary | ICD-10-CM | POA: Diagnosis not present

## 2021-10-31 DIAGNOSIS — I10 Essential (primary) hypertension: Secondary | ICD-10-CM | POA: Diagnosis not present

## 2021-10-31 DIAGNOSIS — Z6821 Body mass index (BMI) 21.0-21.9, adult: Secondary | ICD-10-CM | POA: Diagnosis not present

## 2021-10-31 DIAGNOSIS — E43 Unspecified severe protein-calorie malnutrition: Secondary | ICD-10-CM | POA: Diagnosis not present

## 2021-10-31 DIAGNOSIS — I69354 Hemiplegia and hemiparesis following cerebral infarction affecting left non-dominant side: Secondary | ICD-10-CM | POA: Diagnosis not present

## 2021-10-31 DIAGNOSIS — G309 Alzheimer's disease, unspecified: Secondary | ICD-10-CM | POA: Diagnosis not present

## 2021-11-04 DIAGNOSIS — Z6821 Body mass index (BMI) 21.0-21.9, adult: Secondary | ICD-10-CM | POA: Diagnosis not present

## 2021-11-04 DIAGNOSIS — G309 Alzheimer's disease, unspecified: Secondary | ICD-10-CM | POA: Diagnosis not present

## 2021-11-04 DIAGNOSIS — E43 Unspecified severe protein-calorie malnutrition: Secondary | ICD-10-CM | POA: Diagnosis not present

## 2021-11-04 DIAGNOSIS — I10 Essential (primary) hypertension: Secondary | ICD-10-CM | POA: Diagnosis not present

## 2021-11-04 DIAGNOSIS — F0280 Dementia in other diseases classified elsewhere without behavioral disturbance: Secondary | ICD-10-CM | POA: Diagnosis not present

## 2021-11-04 DIAGNOSIS — I69354 Hemiplegia and hemiparesis following cerebral infarction affecting left non-dominant side: Secondary | ICD-10-CM | POA: Diagnosis not present

## 2021-11-06 DIAGNOSIS — Z6821 Body mass index (BMI) 21.0-21.9, adult: Secondary | ICD-10-CM | POA: Diagnosis not present

## 2021-11-06 DIAGNOSIS — R634 Abnormal weight loss: Secondary | ICD-10-CM | POA: Diagnosis not present

## 2021-11-06 DIAGNOSIS — I69354 Hemiplegia and hemiparesis following cerebral infarction affecting left non-dominant side: Secondary | ICD-10-CM | POA: Diagnosis not present

## 2021-11-06 DIAGNOSIS — F0280 Dementia in other diseases classified elsewhere without behavioral disturbance: Secondary | ICD-10-CM | POA: Diagnosis not present

## 2021-11-06 DIAGNOSIS — Z853 Personal history of malignant neoplasm of breast: Secondary | ICD-10-CM | POA: Diagnosis not present

## 2021-11-06 DIAGNOSIS — G309 Alzheimer's disease, unspecified: Secondary | ICD-10-CM | POA: Diagnosis not present

## 2021-11-06 DIAGNOSIS — I10 Essential (primary) hypertension: Secondary | ICD-10-CM | POA: Diagnosis not present

## 2021-11-06 DIAGNOSIS — L89152 Pressure ulcer of sacral region, stage 2: Secondary | ICD-10-CM | POA: Diagnosis not present

## 2021-11-06 DIAGNOSIS — Z9981 Dependence on supplemental oxygen: Secondary | ICD-10-CM | POA: Diagnosis not present

## 2021-11-06 DIAGNOSIS — E43 Unspecified severe protein-calorie malnutrition: Secondary | ICD-10-CM | POA: Diagnosis not present

## 2021-11-07 DIAGNOSIS — G309 Alzheimer's disease, unspecified: Secondary | ICD-10-CM | POA: Diagnosis not present

## 2021-11-07 DIAGNOSIS — I10 Essential (primary) hypertension: Secondary | ICD-10-CM | POA: Diagnosis not present

## 2021-11-07 DIAGNOSIS — E43 Unspecified severe protein-calorie malnutrition: Secondary | ICD-10-CM | POA: Diagnosis not present

## 2021-11-07 DIAGNOSIS — I69354 Hemiplegia and hemiparesis following cerebral infarction affecting left non-dominant side: Secondary | ICD-10-CM | POA: Diagnosis not present

## 2021-11-07 DIAGNOSIS — Z6821 Body mass index (BMI) 21.0-21.9, adult: Secondary | ICD-10-CM | POA: Diagnosis not present

## 2021-11-07 DIAGNOSIS — F0280 Dementia in other diseases classified elsewhere without behavioral disturbance: Secondary | ICD-10-CM | POA: Diagnosis not present

## 2021-11-10 DIAGNOSIS — F0280 Dementia in other diseases classified elsewhere without behavioral disturbance: Secondary | ICD-10-CM | POA: Diagnosis not present

## 2021-11-10 DIAGNOSIS — E43 Unspecified severe protein-calorie malnutrition: Secondary | ICD-10-CM | POA: Diagnosis not present

## 2021-11-10 DIAGNOSIS — I69354 Hemiplegia and hemiparesis following cerebral infarction affecting left non-dominant side: Secondary | ICD-10-CM | POA: Diagnosis not present

## 2021-11-10 DIAGNOSIS — Z6821 Body mass index (BMI) 21.0-21.9, adult: Secondary | ICD-10-CM | POA: Diagnosis not present

## 2021-11-10 DIAGNOSIS — I10 Essential (primary) hypertension: Secondary | ICD-10-CM | POA: Diagnosis not present

## 2021-11-10 DIAGNOSIS — G309 Alzheimer's disease, unspecified: Secondary | ICD-10-CM | POA: Diagnosis not present

## 2021-11-11 DIAGNOSIS — Z6821 Body mass index (BMI) 21.0-21.9, adult: Secondary | ICD-10-CM | POA: Diagnosis not present

## 2021-11-11 DIAGNOSIS — I10 Essential (primary) hypertension: Secondary | ICD-10-CM | POA: Diagnosis not present

## 2021-11-11 DIAGNOSIS — I69354 Hemiplegia and hemiparesis following cerebral infarction affecting left non-dominant side: Secondary | ICD-10-CM | POA: Diagnosis not present

## 2021-11-11 DIAGNOSIS — G309 Alzheimer's disease, unspecified: Secondary | ICD-10-CM | POA: Diagnosis not present

## 2021-11-11 DIAGNOSIS — E43 Unspecified severe protein-calorie malnutrition: Secondary | ICD-10-CM | POA: Diagnosis not present

## 2021-11-11 DIAGNOSIS — F0280 Dementia in other diseases classified elsewhere without behavioral disturbance: Secondary | ICD-10-CM | POA: Diagnosis not present

## 2021-11-14 DIAGNOSIS — F0280 Dementia in other diseases classified elsewhere without behavioral disturbance: Secondary | ICD-10-CM | POA: Diagnosis not present

## 2021-11-14 DIAGNOSIS — I10 Essential (primary) hypertension: Secondary | ICD-10-CM | POA: Diagnosis not present

## 2021-11-14 DIAGNOSIS — G309 Alzheimer's disease, unspecified: Secondary | ICD-10-CM | POA: Diagnosis not present

## 2021-11-14 DIAGNOSIS — I69354 Hemiplegia and hemiparesis following cerebral infarction affecting left non-dominant side: Secondary | ICD-10-CM | POA: Diagnosis not present

## 2021-11-14 DIAGNOSIS — Z6821 Body mass index (BMI) 21.0-21.9, adult: Secondary | ICD-10-CM | POA: Diagnosis not present

## 2021-11-14 DIAGNOSIS — E43 Unspecified severe protein-calorie malnutrition: Secondary | ICD-10-CM | POA: Diagnosis not present

## 2021-11-17 DIAGNOSIS — F0280 Dementia in other diseases classified elsewhere without behavioral disturbance: Secondary | ICD-10-CM | POA: Diagnosis not present

## 2021-11-17 DIAGNOSIS — I69354 Hemiplegia and hemiparesis following cerebral infarction affecting left non-dominant side: Secondary | ICD-10-CM | POA: Diagnosis not present

## 2021-11-17 DIAGNOSIS — I10 Essential (primary) hypertension: Secondary | ICD-10-CM | POA: Diagnosis not present

## 2021-11-17 DIAGNOSIS — G309 Alzheimer's disease, unspecified: Secondary | ICD-10-CM | POA: Diagnosis not present

## 2021-11-17 DIAGNOSIS — Z6821 Body mass index (BMI) 21.0-21.9, adult: Secondary | ICD-10-CM | POA: Diagnosis not present

## 2021-11-17 DIAGNOSIS — E43 Unspecified severe protein-calorie malnutrition: Secondary | ICD-10-CM | POA: Diagnosis not present

## 2021-11-18 DIAGNOSIS — G309 Alzheimer's disease, unspecified: Secondary | ICD-10-CM | POA: Diagnosis not present

## 2021-11-18 DIAGNOSIS — E43 Unspecified severe protein-calorie malnutrition: Secondary | ICD-10-CM | POA: Diagnosis not present

## 2021-11-18 DIAGNOSIS — I10 Essential (primary) hypertension: Secondary | ICD-10-CM | POA: Diagnosis not present

## 2021-11-18 DIAGNOSIS — F0280 Dementia in other diseases classified elsewhere without behavioral disturbance: Secondary | ICD-10-CM | POA: Diagnosis not present

## 2021-11-18 DIAGNOSIS — I69354 Hemiplegia and hemiparesis following cerebral infarction affecting left non-dominant side: Secondary | ICD-10-CM | POA: Diagnosis not present

## 2021-11-18 DIAGNOSIS — Z6821 Body mass index (BMI) 21.0-21.9, adult: Secondary | ICD-10-CM | POA: Diagnosis not present

## 2021-11-21 DIAGNOSIS — E43 Unspecified severe protein-calorie malnutrition: Secondary | ICD-10-CM | POA: Diagnosis not present

## 2021-11-21 DIAGNOSIS — F0280 Dementia in other diseases classified elsewhere without behavioral disturbance: Secondary | ICD-10-CM | POA: Diagnosis not present

## 2021-11-21 DIAGNOSIS — I10 Essential (primary) hypertension: Secondary | ICD-10-CM | POA: Diagnosis not present

## 2021-11-21 DIAGNOSIS — I69354 Hemiplegia and hemiparesis following cerebral infarction affecting left non-dominant side: Secondary | ICD-10-CM | POA: Diagnosis not present

## 2021-11-21 DIAGNOSIS — Z6821 Body mass index (BMI) 21.0-21.9, adult: Secondary | ICD-10-CM | POA: Diagnosis not present

## 2021-11-21 DIAGNOSIS — G309 Alzheimer's disease, unspecified: Secondary | ICD-10-CM | POA: Diagnosis not present

## 2021-11-24 DIAGNOSIS — Z6821 Body mass index (BMI) 21.0-21.9, adult: Secondary | ICD-10-CM | POA: Diagnosis not present

## 2021-11-24 DIAGNOSIS — F0280 Dementia in other diseases classified elsewhere without behavioral disturbance: Secondary | ICD-10-CM | POA: Diagnosis not present

## 2021-11-24 DIAGNOSIS — I69354 Hemiplegia and hemiparesis following cerebral infarction affecting left non-dominant side: Secondary | ICD-10-CM | POA: Diagnosis not present

## 2021-11-24 DIAGNOSIS — E43 Unspecified severe protein-calorie malnutrition: Secondary | ICD-10-CM | POA: Diagnosis not present

## 2021-11-24 DIAGNOSIS — G309 Alzheimer's disease, unspecified: Secondary | ICD-10-CM | POA: Diagnosis not present

## 2021-11-24 DIAGNOSIS — I10 Essential (primary) hypertension: Secondary | ICD-10-CM | POA: Diagnosis not present

## 2021-11-25 DIAGNOSIS — I69354 Hemiplegia and hemiparesis following cerebral infarction affecting left non-dominant side: Secondary | ICD-10-CM | POA: Diagnosis not present

## 2021-11-25 DIAGNOSIS — Z6821 Body mass index (BMI) 21.0-21.9, adult: Secondary | ICD-10-CM | POA: Diagnosis not present

## 2021-11-25 DIAGNOSIS — E43 Unspecified severe protein-calorie malnutrition: Secondary | ICD-10-CM | POA: Diagnosis not present

## 2021-11-25 DIAGNOSIS — G309 Alzheimer's disease, unspecified: Secondary | ICD-10-CM | POA: Diagnosis not present

## 2021-11-25 DIAGNOSIS — I10 Essential (primary) hypertension: Secondary | ICD-10-CM | POA: Diagnosis not present

## 2021-11-25 DIAGNOSIS — F0280 Dementia in other diseases classified elsewhere without behavioral disturbance: Secondary | ICD-10-CM | POA: Diagnosis not present

## 2021-11-28 DIAGNOSIS — I69354 Hemiplegia and hemiparesis following cerebral infarction affecting left non-dominant side: Secondary | ICD-10-CM | POA: Diagnosis not present

## 2021-11-28 DIAGNOSIS — F0280 Dementia in other diseases classified elsewhere without behavioral disturbance: Secondary | ICD-10-CM | POA: Diagnosis not present

## 2021-11-28 DIAGNOSIS — I10 Essential (primary) hypertension: Secondary | ICD-10-CM | POA: Diagnosis not present

## 2021-11-28 DIAGNOSIS — E43 Unspecified severe protein-calorie malnutrition: Secondary | ICD-10-CM | POA: Diagnosis not present

## 2021-11-28 DIAGNOSIS — Z6821 Body mass index (BMI) 21.0-21.9, adult: Secondary | ICD-10-CM | POA: Diagnosis not present

## 2021-11-28 DIAGNOSIS — G309 Alzheimer's disease, unspecified: Secondary | ICD-10-CM | POA: Diagnosis not present

## 2021-12-01 DIAGNOSIS — F0280 Dementia in other diseases classified elsewhere without behavioral disturbance: Secondary | ICD-10-CM | POA: Diagnosis not present

## 2021-12-01 DIAGNOSIS — G309 Alzheimer's disease, unspecified: Secondary | ICD-10-CM | POA: Diagnosis not present

## 2021-12-01 DIAGNOSIS — I69354 Hemiplegia and hemiparesis following cerebral infarction affecting left non-dominant side: Secondary | ICD-10-CM | POA: Diagnosis not present

## 2021-12-01 DIAGNOSIS — E43 Unspecified severe protein-calorie malnutrition: Secondary | ICD-10-CM | POA: Diagnosis not present

## 2021-12-01 DIAGNOSIS — Z6821 Body mass index (BMI) 21.0-21.9, adult: Secondary | ICD-10-CM | POA: Diagnosis not present

## 2021-12-01 DIAGNOSIS — I10 Essential (primary) hypertension: Secondary | ICD-10-CM | POA: Diagnosis not present

## 2021-12-02 DIAGNOSIS — E43 Unspecified severe protein-calorie malnutrition: Secondary | ICD-10-CM | POA: Diagnosis not present

## 2021-12-02 DIAGNOSIS — G309 Alzheimer's disease, unspecified: Secondary | ICD-10-CM | POA: Diagnosis not present

## 2021-12-02 DIAGNOSIS — F0280 Dementia in other diseases classified elsewhere without behavioral disturbance: Secondary | ICD-10-CM | POA: Diagnosis not present

## 2021-12-02 DIAGNOSIS — I69354 Hemiplegia and hemiparesis following cerebral infarction affecting left non-dominant side: Secondary | ICD-10-CM | POA: Diagnosis not present

## 2021-12-02 DIAGNOSIS — Z6821 Body mass index (BMI) 21.0-21.9, adult: Secondary | ICD-10-CM | POA: Diagnosis not present

## 2021-12-02 DIAGNOSIS — I10 Essential (primary) hypertension: Secondary | ICD-10-CM | POA: Diagnosis not present

## 2021-12-05 DIAGNOSIS — G309 Alzheimer's disease, unspecified: Secondary | ICD-10-CM | POA: Diagnosis not present

## 2021-12-05 DIAGNOSIS — Z6821 Body mass index (BMI) 21.0-21.9, adult: Secondary | ICD-10-CM | POA: Diagnosis not present

## 2021-12-05 DIAGNOSIS — I10 Essential (primary) hypertension: Secondary | ICD-10-CM | POA: Diagnosis not present

## 2021-12-05 DIAGNOSIS — E43 Unspecified severe protein-calorie malnutrition: Secondary | ICD-10-CM | POA: Diagnosis not present

## 2021-12-05 DIAGNOSIS — I69354 Hemiplegia and hemiparesis following cerebral infarction affecting left non-dominant side: Secondary | ICD-10-CM | POA: Diagnosis not present

## 2021-12-05 DIAGNOSIS — F0280 Dementia in other diseases classified elsewhere without behavioral disturbance: Secondary | ICD-10-CM | POA: Diagnosis not present

## 2021-12-06 DIAGNOSIS — Z6821 Body mass index (BMI) 21.0-21.9, adult: Secondary | ICD-10-CM | POA: Diagnosis not present

## 2021-12-06 DIAGNOSIS — I10 Essential (primary) hypertension: Secondary | ICD-10-CM | POA: Diagnosis not present

## 2021-12-06 DIAGNOSIS — G309 Alzheimer's disease, unspecified: Secondary | ICD-10-CM | POA: Diagnosis not present

## 2021-12-06 DIAGNOSIS — E43 Unspecified severe protein-calorie malnutrition: Secondary | ICD-10-CM | POA: Diagnosis not present

## 2021-12-06 DIAGNOSIS — F0280 Dementia in other diseases classified elsewhere without behavioral disturbance: Secondary | ICD-10-CM | POA: Diagnosis not present

## 2021-12-06 DIAGNOSIS — Z853 Personal history of malignant neoplasm of breast: Secondary | ICD-10-CM | POA: Diagnosis not present

## 2021-12-06 DIAGNOSIS — L89152 Pressure ulcer of sacral region, stage 2: Secondary | ICD-10-CM | POA: Diagnosis not present

## 2021-12-06 DIAGNOSIS — Z9981 Dependence on supplemental oxygen: Secondary | ICD-10-CM | POA: Diagnosis not present

## 2021-12-06 DIAGNOSIS — R634 Abnormal weight loss: Secondary | ICD-10-CM | POA: Diagnosis not present

## 2021-12-06 DIAGNOSIS — I69354 Hemiplegia and hemiparesis following cerebral infarction affecting left non-dominant side: Secondary | ICD-10-CM | POA: Diagnosis not present

## 2021-12-08 DIAGNOSIS — F0280 Dementia in other diseases classified elsewhere without behavioral disturbance: Secondary | ICD-10-CM | POA: Diagnosis not present

## 2021-12-08 DIAGNOSIS — E43 Unspecified severe protein-calorie malnutrition: Secondary | ICD-10-CM | POA: Diagnosis not present

## 2021-12-08 DIAGNOSIS — I69354 Hemiplegia and hemiparesis following cerebral infarction affecting left non-dominant side: Secondary | ICD-10-CM | POA: Diagnosis not present

## 2021-12-08 DIAGNOSIS — Z6821 Body mass index (BMI) 21.0-21.9, adult: Secondary | ICD-10-CM | POA: Diagnosis not present

## 2021-12-08 DIAGNOSIS — I10 Essential (primary) hypertension: Secondary | ICD-10-CM | POA: Diagnosis not present

## 2021-12-08 DIAGNOSIS — G309 Alzheimer's disease, unspecified: Secondary | ICD-10-CM | POA: Diagnosis not present

## 2021-12-12 DIAGNOSIS — I10 Essential (primary) hypertension: Secondary | ICD-10-CM | POA: Diagnosis not present

## 2021-12-12 DIAGNOSIS — F0280 Dementia in other diseases classified elsewhere without behavioral disturbance: Secondary | ICD-10-CM | POA: Diagnosis not present

## 2021-12-12 DIAGNOSIS — G309 Alzheimer's disease, unspecified: Secondary | ICD-10-CM | POA: Diagnosis not present

## 2021-12-12 DIAGNOSIS — E43 Unspecified severe protein-calorie malnutrition: Secondary | ICD-10-CM | POA: Diagnosis not present

## 2021-12-12 DIAGNOSIS — Z6821 Body mass index (BMI) 21.0-21.9, adult: Secondary | ICD-10-CM | POA: Diagnosis not present

## 2021-12-12 DIAGNOSIS — I69354 Hemiplegia and hemiparesis following cerebral infarction affecting left non-dominant side: Secondary | ICD-10-CM | POA: Diagnosis not present

## 2021-12-15 DIAGNOSIS — G309 Alzheimer's disease, unspecified: Secondary | ICD-10-CM | POA: Diagnosis not present

## 2021-12-15 DIAGNOSIS — I69354 Hemiplegia and hemiparesis following cerebral infarction affecting left non-dominant side: Secondary | ICD-10-CM | POA: Diagnosis not present

## 2021-12-15 DIAGNOSIS — F0280 Dementia in other diseases classified elsewhere without behavioral disturbance: Secondary | ICD-10-CM | POA: Diagnosis not present

## 2021-12-15 DIAGNOSIS — I10 Essential (primary) hypertension: Secondary | ICD-10-CM | POA: Diagnosis not present

## 2021-12-15 DIAGNOSIS — E43 Unspecified severe protein-calorie malnutrition: Secondary | ICD-10-CM | POA: Diagnosis not present

## 2021-12-15 DIAGNOSIS — Z6821 Body mass index (BMI) 21.0-21.9, adult: Secondary | ICD-10-CM | POA: Diagnosis not present

## 2021-12-16 DIAGNOSIS — G309 Alzheimer's disease, unspecified: Secondary | ICD-10-CM | POA: Diagnosis not present

## 2021-12-16 DIAGNOSIS — Z6821 Body mass index (BMI) 21.0-21.9, adult: Secondary | ICD-10-CM | POA: Diagnosis not present

## 2021-12-16 DIAGNOSIS — F0280 Dementia in other diseases classified elsewhere without behavioral disturbance: Secondary | ICD-10-CM | POA: Diagnosis not present

## 2021-12-16 DIAGNOSIS — I69354 Hemiplegia and hemiparesis following cerebral infarction affecting left non-dominant side: Secondary | ICD-10-CM | POA: Diagnosis not present

## 2021-12-16 DIAGNOSIS — E43 Unspecified severe protein-calorie malnutrition: Secondary | ICD-10-CM | POA: Diagnosis not present

## 2021-12-16 DIAGNOSIS — I10 Essential (primary) hypertension: Secondary | ICD-10-CM | POA: Diagnosis not present

## 2021-12-19 DIAGNOSIS — F0280 Dementia in other diseases classified elsewhere without behavioral disturbance: Secondary | ICD-10-CM | POA: Diagnosis not present

## 2021-12-19 DIAGNOSIS — Z6821 Body mass index (BMI) 21.0-21.9, adult: Secondary | ICD-10-CM | POA: Diagnosis not present

## 2021-12-19 DIAGNOSIS — E43 Unspecified severe protein-calorie malnutrition: Secondary | ICD-10-CM | POA: Diagnosis not present

## 2021-12-19 DIAGNOSIS — I10 Essential (primary) hypertension: Secondary | ICD-10-CM | POA: Diagnosis not present

## 2021-12-19 DIAGNOSIS — I69354 Hemiplegia and hemiparesis following cerebral infarction affecting left non-dominant side: Secondary | ICD-10-CM | POA: Diagnosis not present

## 2021-12-19 DIAGNOSIS — G309 Alzheimer's disease, unspecified: Secondary | ICD-10-CM | POA: Diagnosis not present

## 2021-12-22 DIAGNOSIS — E43 Unspecified severe protein-calorie malnutrition: Secondary | ICD-10-CM | POA: Diagnosis not present

## 2021-12-22 DIAGNOSIS — I69354 Hemiplegia and hemiparesis following cerebral infarction affecting left non-dominant side: Secondary | ICD-10-CM | POA: Diagnosis not present

## 2021-12-22 DIAGNOSIS — I10 Essential (primary) hypertension: Secondary | ICD-10-CM | POA: Diagnosis not present

## 2021-12-22 DIAGNOSIS — Z6821 Body mass index (BMI) 21.0-21.9, adult: Secondary | ICD-10-CM | POA: Diagnosis not present

## 2021-12-22 DIAGNOSIS — F0280 Dementia in other diseases classified elsewhere without behavioral disturbance: Secondary | ICD-10-CM | POA: Diagnosis not present

## 2021-12-22 DIAGNOSIS — G309 Alzheimer's disease, unspecified: Secondary | ICD-10-CM | POA: Diagnosis not present

## 2021-12-23 DIAGNOSIS — I69354 Hemiplegia and hemiparesis following cerebral infarction affecting left non-dominant side: Secondary | ICD-10-CM | POA: Diagnosis not present

## 2021-12-23 DIAGNOSIS — I10 Essential (primary) hypertension: Secondary | ICD-10-CM | POA: Diagnosis not present

## 2021-12-23 DIAGNOSIS — G309 Alzheimer's disease, unspecified: Secondary | ICD-10-CM | POA: Diagnosis not present

## 2021-12-23 DIAGNOSIS — F0280 Dementia in other diseases classified elsewhere without behavioral disturbance: Secondary | ICD-10-CM | POA: Diagnosis not present

## 2021-12-23 DIAGNOSIS — Z6821 Body mass index (BMI) 21.0-21.9, adult: Secondary | ICD-10-CM | POA: Diagnosis not present

## 2021-12-23 DIAGNOSIS — E43 Unspecified severe protein-calorie malnutrition: Secondary | ICD-10-CM | POA: Diagnosis not present

## 2021-12-26 DIAGNOSIS — Z6821 Body mass index (BMI) 21.0-21.9, adult: Secondary | ICD-10-CM | POA: Diagnosis not present

## 2021-12-26 DIAGNOSIS — E43 Unspecified severe protein-calorie malnutrition: Secondary | ICD-10-CM | POA: Diagnosis not present

## 2021-12-26 DIAGNOSIS — F0280 Dementia in other diseases classified elsewhere without behavioral disturbance: Secondary | ICD-10-CM | POA: Diagnosis not present

## 2021-12-26 DIAGNOSIS — I69354 Hemiplegia and hemiparesis following cerebral infarction affecting left non-dominant side: Secondary | ICD-10-CM | POA: Diagnosis not present

## 2021-12-26 DIAGNOSIS — I10 Essential (primary) hypertension: Secondary | ICD-10-CM | POA: Diagnosis not present

## 2021-12-26 DIAGNOSIS — G309 Alzheimer's disease, unspecified: Secondary | ICD-10-CM | POA: Diagnosis not present

## 2021-12-29 DIAGNOSIS — Z6821 Body mass index (BMI) 21.0-21.9, adult: Secondary | ICD-10-CM | POA: Diagnosis not present

## 2021-12-29 DIAGNOSIS — G309 Alzheimer's disease, unspecified: Secondary | ICD-10-CM | POA: Diagnosis not present

## 2021-12-29 DIAGNOSIS — F0280 Dementia in other diseases classified elsewhere without behavioral disturbance: Secondary | ICD-10-CM | POA: Diagnosis not present

## 2021-12-29 DIAGNOSIS — E43 Unspecified severe protein-calorie malnutrition: Secondary | ICD-10-CM | POA: Diagnosis not present

## 2021-12-29 DIAGNOSIS — I10 Essential (primary) hypertension: Secondary | ICD-10-CM | POA: Diagnosis not present

## 2021-12-29 DIAGNOSIS — I69354 Hemiplegia and hemiparesis following cerebral infarction affecting left non-dominant side: Secondary | ICD-10-CM | POA: Diagnosis not present

## 2021-12-30 DIAGNOSIS — G309 Alzheimer's disease, unspecified: Secondary | ICD-10-CM | POA: Diagnosis not present

## 2021-12-30 DIAGNOSIS — Z6821 Body mass index (BMI) 21.0-21.9, adult: Secondary | ICD-10-CM | POA: Diagnosis not present

## 2021-12-30 DIAGNOSIS — I69354 Hemiplegia and hemiparesis following cerebral infarction affecting left non-dominant side: Secondary | ICD-10-CM | POA: Diagnosis not present

## 2021-12-30 DIAGNOSIS — E43 Unspecified severe protein-calorie malnutrition: Secondary | ICD-10-CM | POA: Diagnosis not present

## 2021-12-30 DIAGNOSIS — I10 Essential (primary) hypertension: Secondary | ICD-10-CM | POA: Diagnosis not present

## 2021-12-30 DIAGNOSIS — F0280 Dementia in other diseases classified elsewhere without behavioral disturbance: Secondary | ICD-10-CM | POA: Diagnosis not present

## 2022-01-02 DIAGNOSIS — I69354 Hemiplegia and hemiparesis following cerebral infarction affecting left non-dominant side: Secondary | ICD-10-CM | POA: Diagnosis not present

## 2022-01-02 DIAGNOSIS — I10 Essential (primary) hypertension: Secondary | ICD-10-CM | POA: Diagnosis not present

## 2022-01-02 DIAGNOSIS — F0280 Dementia in other diseases classified elsewhere without behavioral disturbance: Secondary | ICD-10-CM | POA: Diagnosis not present

## 2022-01-02 DIAGNOSIS — G309 Alzheimer's disease, unspecified: Secondary | ICD-10-CM | POA: Diagnosis not present

## 2022-01-02 DIAGNOSIS — Z6821 Body mass index (BMI) 21.0-21.9, adult: Secondary | ICD-10-CM | POA: Diagnosis not present

## 2022-01-02 DIAGNOSIS — E43 Unspecified severe protein-calorie malnutrition: Secondary | ICD-10-CM | POA: Diagnosis not present

## 2022-01-05 DIAGNOSIS — E43 Unspecified severe protein-calorie malnutrition: Secondary | ICD-10-CM | POA: Diagnosis not present

## 2022-01-05 DIAGNOSIS — Z6821 Body mass index (BMI) 21.0-21.9, adult: Secondary | ICD-10-CM | POA: Diagnosis not present

## 2022-01-05 DIAGNOSIS — G309 Alzheimer's disease, unspecified: Secondary | ICD-10-CM | POA: Diagnosis not present

## 2022-01-05 DIAGNOSIS — I69354 Hemiplegia and hemiparesis following cerebral infarction affecting left non-dominant side: Secondary | ICD-10-CM | POA: Diagnosis not present

## 2022-01-05 DIAGNOSIS — I10 Essential (primary) hypertension: Secondary | ICD-10-CM | POA: Diagnosis not present

## 2022-01-05 DIAGNOSIS — F0280 Dementia in other diseases classified elsewhere without behavioral disturbance: Secondary | ICD-10-CM | POA: Diagnosis not present

## 2022-01-06 DIAGNOSIS — I10 Essential (primary) hypertension: Secondary | ICD-10-CM | POA: Diagnosis not present

## 2022-01-06 DIAGNOSIS — R634 Abnormal weight loss: Secondary | ICD-10-CM | POA: Diagnosis not present

## 2022-01-06 DIAGNOSIS — I69354 Hemiplegia and hemiparesis following cerebral infarction affecting left non-dominant side: Secondary | ICD-10-CM | POA: Diagnosis not present

## 2022-01-06 DIAGNOSIS — L89152 Pressure ulcer of sacral region, stage 2: Secondary | ICD-10-CM | POA: Diagnosis not present

## 2022-01-06 DIAGNOSIS — Z681 Body mass index (BMI) 19 or less, adult: Secondary | ICD-10-CM | POA: Diagnosis not present

## 2022-01-06 DIAGNOSIS — G309 Alzheimer's disease, unspecified: Secondary | ICD-10-CM | POA: Diagnosis not present

## 2022-01-06 DIAGNOSIS — Z9981 Dependence on supplemental oxygen: Secondary | ICD-10-CM | POA: Diagnosis not present

## 2022-01-06 DIAGNOSIS — Z7401 Bed confinement status: Secondary | ICD-10-CM | POA: Diagnosis not present

## 2022-01-06 DIAGNOSIS — Z741 Need for assistance with personal care: Secondary | ICD-10-CM | POA: Diagnosis not present

## 2022-01-06 DIAGNOSIS — R64 Cachexia: Secondary | ICD-10-CM | POA: Diagnosis not present

## 2022-01-06 DIAGNOSIS — F02811 Dementia in other diseases classified elsewhere, unspecified severity, with agitation: Secondary | ICD-10-CM | POA: Diagnosis not present

## 2022-01-06 DIAGNOSIS — E43 Unspecified severe protein-calorie malnutrition: Secondary | ICD-10-CM | POA: Diagnosis not present

## 2022-01-06 DIAGNOSIS — Z853 Personal history of malignant neoplasm of breast: Secondary | ICD-10-CM | POA: Diagnosis not present

## 2022-01-09 DIAGNOSIS — E43 Unspecified severe protein-calorie malnutrition: Secondary | ICD-10-CM | POA: Diagnosis not present

## 2022-01-09 DIAGNOSIS — R634 Abnormal weight loss: Secondary | ICD-10-CM | POA: Diagnosis not present

## 2022-01-09 DIAGNOSIS — I10 Essential (primary) hypertension: Secondary | ICD-10-CM | POA: Diagnosis not present

## 2022-01-09 DIAGNOSIS — I69354 Hemiplegia and hemiparesis following cerebral infarction affecting left non-dominant side: Secondary | ICD-10-CM | POA: Diagnosis not present

## 2022-01-09 DIAGNOSIS — G309 Alzheimer's disease, unspecified: Secondary | ICD-10-CM | POA: Diagnosis not present

## 2022-01-09 DIAGNOSIS — F02811 Dementia in other diseases classified elsewhere, unspecified severity, with agitation: Secondary | ICD-10-CM | POA: Diagnosis not present

## 2022-01-12 DIAGNOSIS — E43 Unspecified severe protein-calorie malnutrition: Secondary | ICD-10-CM | POA: Diagnosis not present

## 2022-01-12 DIAGNOSIS — F02811 Dementia in other diseases classified elsewhere, unspecified severity, with agitation: Secondary | ICD-10-CM | POA: Diagnosis not present

## 2022-01-12 DIAGNOSIS — R634 Abnormal weight loss: Secondary | ICD-10-CM | POA: Diagnosis not present

## 2022-01-12 DIAGNOSIS — I10 Essential (primary) hypertension: Secondary | ICD-10-CM | POA: Diagnosis not present

## 2022-01-12 DIAGNOSIS — G309 Alzheimer's disease, unspecified: Secondary | ICD-10-CM | POA: Diagnosis not present

## 2022-01-12 DIAGNOSIS — I69354 Hemiplegia and hemiparesis following cerebral infarction affecting left non-dominant side: Secondary | ICD-10-CM | POA: Diagnosis not present

## 2022-01-13 DIAGNOSIS — I10 Essential (primary) hypertension: Secondary | ICD-10-CM | POA: Diagnosis not present

## 2022-01-13 DIAGNOSIS — R634 Abnormal weight loss: Secondary | ICD-10-CM | POA: Diagnosis not present

## 2022-01-13 DIAGNOSIS — G309 Alzheimer's disease, unspecified: Secondary | ICD-10-CM | POA: Diagnosis not present

## 2022-01-13 DIAGNOSIS — F02811 Dementia in other diseases classified elsewhere, unspecified severity, with agitation: Secondary | ICD-10-CM | POA: Diagnosis not present

## 2022-01-13 DIAGNOSIS — I69354 Hemiplegia and hemiparesis following cerebral infarction affecting left non-dominant side: Secondary | ICD-10-CM | POA: Diagnosis not present

## 2022-01-13 DIAGNOSIS — E43 Unspecified severe protein-calorie malnutrition: Secondary | ICD-10-CM | POA: Diagnosis not present

## 2022-01-16 DIAGNOSIS — E43 Unspecified severe protein-calorie malnutrition: Secondary | ICD-10-CM | POA: Diagnosis not present

## 2022-01-16 DIAGNOSIS — G309 Alzheimer's disease, unspecified: Secondary | ICD-10-CM | POA: Diagnosis not present

## 2022-01-16 DIAGNOSIS — F02811 Dementia in other diseases classified elsewhere, unspecified severity, with agitation: Secondary | ICD-10-CM | POA: Diagnosis not present

## 2022-01-16 DIAGNOSIS — R634 Abnormal weight loss: Secondary | ICD-10-CM | POA: Diagnosis not present

## 2022-01-16 DIAGNOSIS — I10 Essential (primary) hypertension: Secondary | ICD-10-CM | POA: Diagnosis not present

## 2022-01-16 DIAGNOSIS — I69354 Hemiplegia and hemiparesis following cerebral infarction affecting left non-dominant side: Secondary | ICD-10-CM | POA: Diagnosis not present

## 2022-01-19 DIAGNOSIS — G309 Alzheimer's disease, unspecified: Secondary | ICD-10-CM | POA: Diagnosis not present

## 2022-01-19 DIAGNOSIS — E43 Unspecified severe protein-calorie malnutrition: Secondary | ICD-10-CM | POA: Diagnosis not present

## 2022-01-19 DIAGNOSIS — F02811 Dementia in other diseases classified elsewhere, unspecified severity, with agitation: Secondary | ICD-10-CM | POA: Diagnosis not present

## 2022-01-19 DIAGNOSIS — I10 Essential (primary) hypertension: Secondary | ICD-10-CM | POA: Diagnosis not present

## 2022-01-19 DIAGNOSIS — R634 Abnormal weight loss: Secondary | ICD-10-CM | POA: Diagnosis not present

## 2022-01-19 DIAGNOSIS — I69354 Hemiplegia and hemiparesis following cerebral infarction affecting left non-dominant side: Secondary | ICD-10-CM | POA: Diagnosis not present

## 2022-01-20 DIAGNOSIS — I10 Essential (primary) hypertension: Secondary | ICD-10-CM | POA: Diagnosis not present

## 2022-01-20 DIAGNOSIS — E43 Unspecified severe protein-calorie malnutrition: Secondary | ICD-10-CM | POA: Diagnosis not present

## 2022-01-20 DIAGNOSIS — R634 Abnormal weight loss: Secondary | ICD-10-CM | POA: Diagnosis not present

## 2022-01-20 DIAGNOSIS — F02811 Dementia in other diseases classified elsewhere, unspecified severity, with agitation: Secondary | ICD-10-CM | POA: Diagnosis not present

## 2022-01-20 DIAGNOSIS — G309 Alzheimer's disease, unspecified: Secondary | ICD-10-CM | POA: Diagnosis not present

## 2022-01-20 DIAGNOSIS — I69354 Hemiplegia and hemiparesis following cerebral infarction affecting left non-dominant side: Secondary | ICD-10-CM | POA: Diagnosis not present

## 2022-01-23 DIAGNOSIS — G309 Alzheimer's disease, unspecified: Secondary | ICD-10-CM | POA: Diagnosis not present

## 2022-01-23 DIAGNOSIS — F02811 Dementia in other diseases classified elsewhere, unspecified severity, with agitation: Secondary | ICD-10-CM | POA: Diagnosis not present

## 2022-01-23 DIAGNOSIS — R634 Abnormal weight loss: Secondary | ICD-10-CM | POA: Diagnosis not present

## 2022-01-23 DIAGNOSIS — I10 Essential (primary) hypertension: Secondary | ICD-10-CM | POA: Diagnosis not present

## 2022-01-23 DIAGNOSIS — I69354 Hemiplegia and hemiparesis following cerebral infarction affecting left non-dominant side: Secondary | ICD-10-CM | POA: Diagnosis not present

## 2022-01-23 DIAGNOSIS — E43 Unspecified severe protein-calorie malnutrition: Secondary | ICD-10-CM | POA: Diagnosis not present

## 2022-01-26 DIAGNOSIS — I69354 Hemiplegia and hemiparesis following cerebral infarction affecting left non-dominant side: Secondary | ICD-10-CM | POA: Diagnosis not present

## 2022-01-26 DIAGNOSIS — I10 Essential (primary) hypertension: Secondary | ICD-10-CM | POA: Diagnosis not present

## 2022-01-26 DIAGNOSIS — R634 Abnormal weight loss: Secondary | ICD-10-CM | POA: Diagnosis not present

## 2022-01-26 DIAGNOSIS — E43 Unspecified severe protein-calorie malnutrition: Secondary | ICD-10-CM | POA: Diagnosis not present

## 2022-01-26 DIAGNOSIS — G309 Alzheimer's disease, unspecified: Secondary | ICD-10-CM | POA: Diagnosis not present

## 2022-01-26 DIAGNOSIS — F02811 Dementia in other diseases classified elsewhere, unspecified severity, with agitation: Secondary | ICD-10-CM | POA: Diagnosis not present

## 2022-01-27 DIAGNOSIS — E43 Unspecified severe protein-calorie malnutrition: Secondary | ICD-10-CM | POA: Diagnosis not present

## 2022-01-27 DIAGNOSIS — I10 Essential (primary) hypertension: Secondary | ICD-10-CM | POA: Diagnosis not present

## 2022-01-27 DIAGNOSIS — F02811 Dementia in other diseases classified elsewhere, unspecified severity, with agitation: Secondary | ICD-10-CM | POA: Diagnosis not present

## 2022-01-27 DIAGNOSIS — I69354 Hemiplegia and hemiparesis following cerebral infarction affecting left non-dominant side: Secondary | ICD-10-CM | POA: Diagnosis not present

## 2022-01-27 DIAGNOSIS — G309 Alzheimer's disease, unspecified: Secondary | ICD-10-CM | POA: Diagnosis not present

## 2022-01-27 DIAGNOSIS — R634 Abnormal weight loss: Secondary | ICD-10-CM | POA: Diagnosis not present

## 2022-01-30 DIAGNOSIS — I69354 Hemiplegia and hemiparesis following cerebral infarction affecting left non-dominant side: Secondary | ICD-10-CM | POA: Diagnosis not present

## 2022-01-30 DIAGNOSIS — G309 Alzheimer's disease, unspecified: Secondary | ICD-10-CM | POA: Diagnosis not present

## 2022-01-30 DIAGNOSIS — E43 Unspecified severe protein-calorie malnutrition: Secondary | ICD-10-CM | POA: Diagnosis not present

## 2022-01-30 DIAGNOSIS — I10 Essential (primary) hypertension: Secondary | ICD-10-CM | POA: Diagnosis not present

## 2022-01-30 DIAGNOSIS — F02811 Dementia in other diseases classified elsewhere, unspecified severity, with agitation: Secondary | ICD-10-CM | POA: Diagnosis not present

## 2022-01-30 DIAGNOSIS — R634 Abnormal weight loss: Secondary | ICD-10-CM | POA: Diagnosis not present

## 2022-02-02 DIAGNOSIS — F02811 Dementia in other diseases classified elsewhere, unspecified severity, with agitation: Secondary | ICD-10-CM | POA: Diagnosis not present

## 2022-02-02 DIAGNOSIS — G309 Alzheimer's disease, unspecified: Secondary | ICD-10-CM | POA: Diagnosis not present

## 2022-02-02 DIAGNOSIS — I10 Essential (primary) hypertension: Secondary | ICD-10-CM | POA: Diagnosis not present

## 2022-02-02 DIAGNOSIS — E43 Unspecified severe protein-calorie malnutrition: Secondary | ICD-10-CM | POA: Diagnosis not present

## 2022-02-02 DIAGNOSIS — R634 Abnormal weight loss: Secondary | ICD-10-CM | POA: Diagnosis not present

## 2022-02-02 DIAGNOSIS — I69354 Hemiplegia and hemiparesis following cerebral infarction affecting left non-dominant side: Secondary | ICD-10-CM | POA: Diagnosis not present

## 2022-02-03 DIAGNOSIS — E43 Unspecified severe protein-calorie malnutrition: Secondary | ICD-10-CM | POA: Diagnosis not present

## 2022-02-03 DIAGNOSIS — G309 Alzheimer's disease, unspecified: Secondary | ICD-10-CM | POA: Diagnosis not present

## 2022-02-03 DIAGNOSIS — R634 Abnormal weight loss: Secondary | ICD-10-CM | POA: Diagnosis not present

## 2022-02-03 DIAGNOSIS — F02811 Dementia in other diseases classified elsewhere, unspecified severity, with agitation: Secondary | ICD-10-CM | POA: Diagnosis not present

## 2022-02-03 DIAGNOSIS — I69354 Hemiplegia and hemiparesis following cerebral infarction affecting left non-dominant side: Secondary | ICD-10-CM | POA: Diagnosis not present

## 2022-02-03 DIAGNOSIS — I10 Essential (primary) hypertension: Secondary | ICD-10-CM | POA: Diagnosis not present

## 2022-02-06 DIAGNOSIS — R634 Abnormal weight loss: Secondary | ICD-10-CM | POA: Diagnosis not present

## 2022-02-06 DIAGNOSIS — Z853 Personal history of malignant neoplasm of breast: Secondary | ICD-10-CM | POA: Diagnosis not present

## 2022-02-06 DIAGNOSIS — R64 Cachexia: Secondary | ICD-10-CM | POA: Diagnosis not present

## 2022-02-06 DIAGNOSIS — G309 Alzheimer's disease, unspecified: Secondary | ICD-10-CM | POA: Diagnosis not present

## 2022-02-06 DIAGNOSIS — Z7401 Bed confinement status: Secondary | ICD-10-CM | POA: Diagnosis not present

## 2022-02-06 DIAGNOSIS — Z681 Body mass index (BMI) 19 or less, adult: Secondary | ICD-10-CM | POA: Diagnosis not present

## 2022-02-06 DIAGNOSIS — I69354 Hemiplegia and hemiparesis following cerebral infarction affecting left non-dominant side: Secondary | ICD-10-CM | POA: Diagnosis not present

## 2022-02-06 DIAGNOSIS — F02811 Dementia in other diseases classified elsewhere, unspecified severity, with agitation: Secondary | ICD-10-CM | POA: Diagnosis not present

## 2022-02-06 DIAGNOSIS — Z741 Need for assistance with personal care: Secondary | ICD-10-CM | POA: Diagnosis not present

## 2022-02-06 DIAGNOSIS — Z9981 Dependence on supplemental oxygen: Secondary | ICD-10-CM | POA: Diagnosis not present

## 2022-02-06 DIAGNOSIS — E43 Unspecified severe protein-calorie malnutrition: Secondary | ICD-10-CM | POA: Diagnosis not present

## 2022-02-06 DIAGNOSIS — I10 Essential (primary) hypertension: Secondary | ICD-10-CM | POA: Diagnosis not present

## 2022-02-06 DIAGNOSIS — L89152 Pressure ulcer of sacral region, stage 2: Secondary | ICD-10-CM | POA: Diagnosis not present

## 2022-02-10 DIAGNOSIS — R634 Abnormal weight loss: Secondary | ICD-10-CM | POA: Diagnosis not present

## 2022-02-10 DIAGNOSIS — I10 Essential (primary) hypertension: Secondary | ICD-10-CM | POA: Diagnosis not present

## 2022-02-10 DIAGNOSIS — E43 Unspecified severe protein-calorie malnutrition: Secondary | ICD-10-CM | POA: Diagnosis not present

## 2022-02-10 DIAGNOSIS — G309 Alzheimer's disease, unspecified: Secondary | ICD-10-CM | POA: Diagnosis not present

## 2022-02-10 DIAGNOSIS — F02811 Dementia in other diseases classified elsewhere, unspecified severity, with agitation: Secondary | ICD-10-CM | POA: Diagnosis not present

## 2022-02-10 DIAGNOSIS — I69354 Hemiplegia and hemiparesis following cerebral infarction affecting left non-dominant side: Secondary | ICD-10-CM | POA: Diagnosis not present

## 2022-02-13 DIAGNOSIS — R634 Abnormal weight loss: Secondary | ICD-10-CM | POA: Diagnosis not present

## 2022-02-13 DIAGNOSIS — F02811 Dementia in other diseases classified elsewhere, unspecified severity, with agitation: Secondary | ICD-10-CM | POA: Diagnosis not present

## 2022-02-13 DIAGNOSIS — I69354 Hemiplegia and hemiparesis following cerebral infarction affecting left non-dominant side: Secondary | ICD-10-CM | POA: Diagnosis not present

## 2022-02-13 DIAGNOSIS — G309 Alzheimer's disease, unspecified: Secondary | ICD-10-CM | POA: Diagnosis not present

## 2022-02-13 DIAGNOSIS — E43 Unspecified severe protein-calorie malnutrition: Secondary | ICD-10-CM | POA: Diagnosis not present

## 2022-02-13 DIAGNOSIS — I10 Essential (primary) hypertension: Secondary | ICD-10-CM | POA: Diagnosis not present

## 2022-02-17 DIAGNOSIS — F02811 Dementia in other diseases classified elsewhere, unspecified severity, with agitation: Secondary | ICD-10-CM | POA: Diagnosis not present

## 2022-02-17 DIAGNOSIS — I69354 Hemiplegia and hemiparesis following cerebral infarction affecting left non-dominant side: Secondary | ICD-10-CM | POA: Diagnosis not present

## 2022-02-17 DIAGNOSIS — E43 Unspecified severe protein-calorie malnutrition: Secondary | ICD-10-CM | POA: Diagnosis not present

## 2022-02-17 DIAGNOSIS — R634 Abnormal weight loss: Secondary | ICD-10-CM | POA: Diagnosis not present

## 2022-02-17 DIAGNOSIS — I10 Essential (primary) hypertension: Secondary | ICD-10-CM | POA: Diagnosis not present

## 2022-02-17 DIAGNOSIS — G309 Alzheimer's disease, unspecified: Secondary | ICD-10-CM | POA: Diagnosis not present

## 2022-02-20 DIAGNOSIS — G309 Alzheimer's disease, unspecified: Secondary | ICD-10-CM | POA: Diagnosis not present

## 2022-02-20 DIAGNOSIS — E43 Unspecified severe protein-calorie malnutrition: Secondary | ICD-10-CM | POA: Diagnosis not present

## 2022-02-20 DIAGNOSIS — F02811 Dementia in other diseases classified elsewhere, unspecified severity, with agitation: Secondary | ICD-10-CM | POA: Diagnosis not present

## 2022-02-20 DIAGNOSIS — R634 Abnormal weight loss: Secondary | ICD-10-CM | POA: Diagnosis not present

## 2022-02-20 DIAGNOSIS — I69354 Hemiplegia and hemiparesis following cerebral infarction affecting left non-dominant side: Secondary | ICD-10-CM | POA: Diagnosis not present

## 2022-02-20 DIAGNOSIS — I10 Essential (primary) hypertension: Secondary | ICD-10-CM | POA: Diagnosis not present

## 2022-02-23 DIAGNOSIS — I10 Essential (primary) hypertension: Secondary | ICD-10-CM | POA: Diagnosis not present

## 2022-02-23 DIAGNOSIS — I69354 Hemiplegia and hemiparesis following cerebral infarction affecting left non-dominant side: Secondary | ICD-10-CM | POA: Diagnosis not present

## 2022-02-23 DIAGNOSIS — R634 Abnormal weight loss: Secondary | ICD-10-CM | POA: Diagnosis not present

## 2022-02-23 DIAGNOSIS — E43 Unspecified severe protein-calorie malnutrition: Secondary | ICD-10-CM | POA: Diagnosis not present

## 2022-02-23 DIAGNOSIS — F02811 Dementia in other diseases classified elsewhere, unspecified severity, with agitation: Secondary | ICD-10-CM | POA: Diagnosis not present

## 2022-02-23 DIAGNOSIS — G309 Alzheimer's disease, unspecified: Secondary | ICD-10-CM | POA: Diagnosis not present

## 2022-02-24 DIAGNOSIS — I10 Essential (primary) hypertension: Secondary | ICD-10-CM | POA: Diagnosis not present

## 2022-02-24 DIAGNOSIS — E43 Unspecified severe protein-calorie malnutrition: Secondary | ICD-10-CM | POA: Diagnosis not present

## 2022-02-24 DIAGNOSIS — R634 Abnormal weight loss: Secondary | ICD-10-CM | POA: Diagnosis not present

## 2022-02-24 DIAGNOSIS — G309 Alzheimer's disease, unspecified: Secondary | ICD-10-CM | POA: Diagnosis not present

## 2022-02-24 DIAGNOSIS — F02811 Dementia in other diseases classified elsewhere, unspecified severity, with agitation: Secondary | ICD-10-CM | POA: Diagnosis not present

## 2022-02-24 DIAGNOSIS — I69354 Hemiplegia and hemiparesis following cerebral infarction affecting left non-dominant side: Secondary | ICD-10-CM | POA: Diagnosis not present

## 2022-02-27 DIAGNOSIS — F02811 Dementia in other diseases classified elsewhere, unspecified severity, with agitation: Secondary | ICD-10-CM | POA: Diagnosis not present

## 2022-02-27 DIAGNOSIS — E43 Unspecified severe protein-calorie malnutrition: Secondary | ICD-10-CM | POA: Diagnosis not present

## 2022-02-27 DIAGNOSIS — R634 Abnormal weight loss: Secondary | ICD-10-CM | POA: Diagnosis not present

## 2022-02-27 DIAGNOSIS — I10 Essential (primary) hypertension: Secondary | ICD-10-CM | POA: Diagnosis not present

## 2022-02-27 DIAGNOSIS — I69354 Hemiplegia and hemiparesis following cerebral infarction affecting left non-dominant side: Secondary | ICD-10-CM | POA: Diagnosis not present

## 2022-02-27 DIAGNOSIS — G309 Alzheimer's disease, unspecified: Secondary | ICD-10-CM | POA: Diagnosis not present

## 2022-03-02 DIAGNOSIS — R634 Abnormal weight loss: Secondary | ICD-10-CM | POA: Diagnosis not present

## 2022-03-02 DIAGNOSIS — I10 Essential (primary) hypertension: Secondary | ICD-10-CM | POA: Diagnosis not present

## 2022-03-02 DIAGNOSIS — E43 Unspecified severe protein-calorie malnutrition: Secondary | ICD-10-CM | POA: Diagnosis not present

## 2022-03-02 DIAGNOSIS — I69354 Hemiplegia and hemiparesis following cerebral infarction affecting left non-dominant side: Secondary | ICD-10-CM | POA: Diagnosis not present

## 2022-03-02 DIAGNOSIS — G309 Alzheimer's disease, unspecified: Secondary | ICD-10-CM | POA: Diagnosis not present

## 2022-03-02 DIAGNOSIS — F02811 Dementia in other diseases classified elsewhere, unspecified severity, with agitation: Secondary | ICD-10-CM | POA: Diagnosis not present

## 2022-03-03 DIAGNOSIS — I10 Essential (primary) hypertension: Secondary | ICD-10-CM | POA: Diagnosis not present

## 2022-03-03 DIAGNOSIS — G309 Alzheimer's disease, unspecified: Secondary | ICD-10-CM | POA: Diagnosis not present

## 2022-03-03 DIAGNOSIS — R634 Abnormal weight loss: Secondary | ICD-10-CM | POA: Diagnosis not present

## 2022-03-03 DIAGNOSIS — I69354 Hemiplegia and hemiparesis following cerebral infarction affecting left non-dominant side: Secondary | ICD-10-CM | POA: Diagnosis not present

## 2022-03-03 DIAGNOSIS — E43 Unspecified severe protein-calorie malnutrition: Secondary | ICD-10-CM | POA: Diagnosis not present

## 2022-03-03 DIAGNOSIS — F02811 Dementia in other diseases classified elsewhere, unspecified severity, with agitation: Secondary | ICD-10-CM | POA: Diagnosis not present

## 2022-03-06 DIAGNOSIS — R634 Abnormal weight loss: Secondary | ICD-10-CM | POA: Diagnosis not present

## 2022-03-06 DIAGNOSIS — I10 Essential (primary) hypertension: Secondary | ICD-10-CM | POA: Diagnosis not present

## 2022-03-06 DIAGNOSIS — G309 Alzheimer's disease, unspecified: Secondary | ICD-10-CM | POA: Diagnosis not present

## 2022-03-06 DIAGNOSIS — F02811 Dementia in other diseases classified elsewhere, unspecified severity, with agitation: Secondary | ICD-10-CM | POA: Diagnosis not present

## 2022-03-06 DIAGNOSIS — I69354 Hemiplegia and hemiparesis following cerebral infarction affecting left non-dominant side: Secondary | ICD-10-CM | POA: Diagnosis not present

## 2022-03-06 DIAGNOSIS — E43 Unspecified severe protein-calorie malnutrition: Secondary | ICD-10-CM | POA: Diagnosis not present

## 2022-03-08 DIAGNOSIS — Z7401 Bed confinement status: Secondary | ICD-10-CM | POA: Diagnosis not present

## 2022-03-08 DIAGNOSIS — G309 Alzheimer's disease, unspecified: Secondary | ICD-10-CM | POA: Diagnosis not present

## 2022-03-08 DIAGNOSIS — I69354 Hemiplegia and hemiparesis following cerebral infarction affecting left non-dominant side: Secondary | ICD-10-CM | POA: Diagnosis not present

## 2022-03-08 DIAGNOSIS — Z681 Body mass index (BMI) 19 or less, adult: Secondary | ICD-10-CM | POA: Diagnosis not present

## 2022-03-08 DIAGNOSIS — F02811 Dementia in other diseases classified elsewhere, unspecified severity, with agitation: Secondary | ICD-10-CM | POA: Diagnosis not present

## 2022-03-08 DIAGNOSIS — Z853 Personal history of malignant neoplasm of breast: Secondary | ICD-10-CM | POA: Diagnosis not present

## 2022-03-08 DIAGNOSIS — E43 Unspecified severe protein-calorie malnutrition: Secondary | ICD-10-CM | POA: Diagnosis not present

## 2022-03-08 DIAGNOSIS — I10 Essential (primary) hypertension: Secondary | ICD-10-CM | POA: Diagnosis not present

## 2022-03-08 DIAGNOSIS — L89152 Pressure ulcer of sacral region, stage 2: Secondary | ICD-10-CM | POA: Diagnosis not present

## 2022-03-08 DIAGNOSIS — Z741 Need for assistance with personal care: Secondary | ICD-10-CM | POA: Diagnosis not present

## 2022-03-08 DIAGNOSIS — R634 Abnormal weight loss: Secondary | ICD-10-CM | POA: Diagnosis not present

## 2022-03-08 DIAGNOSIS — R64 Cachexia: Secondary | ICD-10-CM | POA: Diagnosis not present

## 2022-03-08 DIAGNOSIS — Z9981 Dependence on supplemental oxygen: Secondary | ICD-10-CM | POA: Diagnosis not present

## 2022-03-09 DIAGNOSIS — G309 Alzheimer's disease, unspecified: Secondary | ICD-10-CM | POA: Diagnosis not present

## 2022-03-09 DIAGNOSIS — E43 Unspecified severe protein-calorie malnutrition: Secondary | ICD-10-CM | POA: Diagnosis not present

## 2022-03-09 DIAGNOSIS — I69354 Hemiplegia and hemiparesis following cerebral infarction affecting left non-dominant side: Secondary | ICD-10-CM | POA: Diagnosis not present

## 2022-03-09 DIAGNOSIS — F02811 Dementia in other diseases classified elsewhere, unspecified severity, with agitation: Secondary | ICD-10-CM | POA: Diagnosis not present

## 2022-03-09 DIAGNOSIS — I10 Essential (primary) hypertension: Secondary | ICD-10-CM | POA: Diagnosis not present

## 2022-03-09 DIAGNOSIS — R634 Abnormal weight loss: Secondary | ICD-10-CM | POA: Diagnosis not present

## 2022-03-10 DIAGNOSIS — I10 Essential (primary) hypertension: Secondary | ICD-10-CM | POA: Diagnosis not present

## 2022-03-10 DIAGNOSIS — E43 Unspecified severe protein-calorie malnutrition: Secondary | ICD-10-CM | POA: Diagnosis not present

## 2022-03-10 DIAGNOSIS — F02811 Dementia in other diseases classified elsewhere, unspecified severity, with agitation: Secondary | ICD-10-CM | POA: Diagnosis not present

## 2022-03-10 DIAGNOSIS — I69354 Hemiplegia and hemiparesis following cerebral infarction affecting left non-dominant side: Secondary | ICD-10-CM | POA: Diagnosis not present

## 2022-03-10 DIAGNOSIS — G309 Alzheimer's disease, unspecified: Secondary | ICD-10-CM | POA: Diagnosis not present

## 2022-03-10 DIAGNOSIS — R634 Abnormal weight loss: Secondary | ICD-10-CM | POA: Diagnosis not present

## 2022-03-13 DIAGNOSIS — G309 Alzheimer's disease, unspecified: Secondary | ICD-10-CM | POA: Diagnosis not present

## 2022-03-13 DIAGNOSIS — F02811 Dementia in other diseases classified elsewhere, unspecified severity, with agitation: Secondary | ICD-10-CM | POA: Diagnosis not present

## 2022-03-13 DIAGNOSIS — I10 Essential (primary) hypertension: Secondary | ICD-10-CM | POA: Diagnosis not present

## 2022-03-13 DIAGNOSIS — I69354 Hemiplegia and hemiparesis following cerebral infarction affecting left non-dominant side: Secondary | ICD-10-CM | POA: Diagnosis not present

## 2022-03-13 DIAGNOSIS — E43 Unspecified severe protein-calorie malnutrition: Secondary | ICD-10-CM | POA: Diagnosis not present

## 2022-03-13 DIAGNOSIS — R634 Abnormal weight loss: Secondary | ICD-10-CM | POA: Diagnosis not present

## 2022-03-16 DIAGNOSIS — I10 Essential (primary) hypertension: Secondary | ICD-10-CM | POA: Diagnosis not present

## 2022-03-16 DIAGNOSIS — I69354 Hemiplegia and hemiparesis following cerebral infarction affecting left non-dominant side: Secondary | ICD-10-CM | POA: Diagnosis not present

## 2022-03-16 DIAGNOSIS — R634 Abnormal weight loss: Secondary | ICD-10-CM | POA: Diagnosis not present

## 2022-03-16 DIAGNOSIS — E43 Unspecified severe protein-calorie malnutrition: Secondary | ICD-10-CM | POA: Diagnosis not present

## 2022-03-16 DIAGNOSIS — F02811 Dementia in other diseases classified elsewhere, unspecified severity, with agitation: Secondary | ICD-10-CM | POA: Diagnosis not present

## 2022-03-16 DIAGNOSIS — G309 Alzheimer's disease, unspecified: Secondary | ICD-10-CM | POA: Diagnosis not present

## 2022-03-17 DIAGNOSIS — E43 Unspecified severe protein-calorie malnutrition: Secondary | ICD-10-CM | POA: Diagnosis not present

## 2022-03-17 DIAGNOSIS — I10 Essential (primary) hypertension: Secondary | ICD-10-CM | POA: Diagnosis not present

## 2022-03-17 DIAGNOSIS — G309 Alzheimer's disease, unspecified: Secondary | ICD-10-CM | POA: Diagnosis not present

## 2022-03-17 DIAGNOSIS — I69354 Hemiplegia and hemiparesis following cerebral infarction affecting left non-dominant side: Secondary | ICD-10-CM | POA: Diagnosis not present

## 2022-03-17 DIAGNOSIS — F02811 Dementia in other diseases classified elsewhere, unspecified severity, with agitation: Secondary | ICD-10-CM | POA: Diagnosis not present

## 2022-03-17 DIAGNOSIS — R634 Abnormal weight loss: Secondary | ICD-10-CM | POA: Diagnosis not present

## 2022-03-20 DIAGNOSIS — I69354 Hemiplegia and hemiparesis following cerebral infarction affecting left non-dominant side: Secondary | ICD-10-CM | POA: Diagnosis not present

## 2022-03-20 DIAGNOSIS — F02811 Dementia in other diseases classified elsewhere, unspecified severity, with agitation: Secondary | ICD-10-CM | POA: Diagnosis not present

## 2022-03-20 DIAGNOSIS — R634 Abnormal weight loss: Secondary | ICD-10-CM | POA: Diagnosis not present

## 2022-03-20 DIAGNOSIS — I10 Essential (primary) hypertension: Secondary | ICD-10-CM | POA: Diagnosis not present

## 2022-03-20 DIAGNOSIS — E43 Unspecified severe protein-calorie malnutrition: Secondary | ICD-10-CM | POA: Diagnosis not present

## 2022-03-20 DIAGNOSIS — G309 Alzheimer's disease, unspecified: Secondary | ICD-10-CM | POA: Diagnosis not present

## 2022-03-23 DIAGNOSIS — E43 Unspecified severe protein-calorie malnutrition: Secondary | ICD-10-CM | POA: Diagnosis not present

## 2022-03-23 DIAGNOSIS — G309 Alzheimer's disease, unspecified: Secondary | ICD-10-CM | POA: Diagnosis not present

## 2022-03-23 DIAGNOSIS — I10 Essential (primary) hypertension: Secondary | ICD-10-CM | POA: Diagnosis not present

## 2022-03-23 DIAGNOSIS — I69354 Hemiplegia and hemiparesis following cerebral infarction affecting left non-dominant side: Secondary | ICD-10-CM | POA: Diagnosis not present

## 2022-03-23 DIAGNOSIS — F02811 Dementia in other diseases classified elsewhere, unspecified severity, with agitation: Secondary | ICD-10-CM | POA: Diagnosis not present

## 2022-03-23 DIAGNOSIS — R634 Abnormal weight loss: Secondary | ICD-10-CM | POA: Diagnosis not present

## 2022-03-24 DIAGNOSIS — E43 Unspecified severe protein-calorie malnutrition: Secondary | ICD-10-CM | POA: Diagnosis not present

## 2022-03-24 DIAGNOSIS — G309 Alzheimer's disease, unspecified: Secondary | ICD-10-CM | POA: Diagnosis not present

## 2022-03-24 DIAGNOSIS — I69354 Hemiplegia and hemiparesis following cerebral infarction affecting left non-dominant side: Secondary | ICD-10-CM | POA: Diagnosis not present

## 2022-03-24 DIAGNOSIS — F02811 Dementia in other diseases classified elsewhere, unspecified severity, with agitation: Secondary | ICD-10-CM | POA: Diagnosis not present

## 2022-03-24 DIAGNOSIS — I10 Essential (primary) hypertension: Secondary | ICD-10-CM | POA: Diagnosis not present

## 2022-03-24 DIAGNOSIS — R634 Abnormal weight loss: Secondary | ICD-10-CM | POA: Diagnosis not present

## 2022-03-30 DIAGNOSIS — R634 Abnormal weight loss: Secondary | ICD-10-CM | POA: Diagnosis not present

## 2022-03-30 DIAGNOSIS — F02811 Dementia in other diseases classified elsewhere, unspecified severity, with agitation: Secondary | ICD-10-CM | POA: Diagnosis not present

## 2022-03-30 DIAGNOSIS — I10 Essential (primary) hypertension: Secondary | ICD-10-CM | POA: Diagnosis not present

## 2022-03-30 DIAGNOSIS — E43 Unspecified severe protein-calorie malnutrition: Secondary | ICD-10-CM | POA: Diagnosis not present

## 2022-03-30 DIAGNOSIS — G309 Alzheimer's disease, unspecified: Secondary | ICD-10-CM | POA: Diagnosis not present

## 2022-03-30 DIAGNOSIS — I69354 Hemiplegia and hemiparesis following cerebral infarction affecting left non-dominant side: Secondary | ICD-10-CM | POA: Diagnosis not present

## 2022-03-31 DIAGNOSIS — I10 Essential (primary) hypertension: Secondary | ICD-10-CM | POA: Diagnosis not present

## 2022-03-31 DIAGNOSIS — G309 Alzheimer's disease, unspecified: Secondary | ICD-10-CM | POA: Diagnosis not present

## 2022-03-31 DIAGNOSIS — I69354 Hemiplegia and hemiparesis following cerebral infarction affecting left non-dominant side: Secondary | ICD-10-CM | POA: Diagnosis not present

## 2022-03-31 DIAGNOSIS — E43 Unspecified severe protein-calorie malnutrition: Secondary | ICD-10-CM | POA: Diagnosis not present

## 2022-03-31 DIAGNOSIS — F02811 Dementia in other diseases classified elsewhere, unspecified severity, with agitation: Secondary | ICD-10-CM | POA: Diagnosis not present

## 2022-03-31 DIAGNOSIS — R634 Abnormal weight loss: Secondary | ICD-10-CM | POA: Diagnosis not present

## 2022-04-03 DIAGNOSIS — R634 Abnormal weight loss: Secondary | ICD-10-CM | POA: Diagnosis not present

## 2022-04-03 DIAGNOSIS — F02811 Dementia in other diseases classified elsewhere, unspecified severity, with agitation: Secondary | ICD-10-CM | POA: Diagnosis not present

## 2022-04-03 DIAGNOSIS — I69354 Hemiplegia and hemiparesis following cerebral infarction affecting left non-dominant side: Secondary | ICD-10-CM | POA: Diagnosis not present

## 2022-04-03 DIAGNOSIS — G309 Alzheimer's disease, unspecified: Secondary | ICD-10-CM | POA: Diagnosis not present

## 2022-04-03 DIAGNOSIS — I10 Essential (primary) hypertension: Secondary | ICD-10-CM | POA: Diagnosis not present

## 2022-04-03 DIAGNOSIS — E43 Unspecified severe protein-calorie malnutrition: Secondary | ICD-10-CM | POA: Diagnosis not present

## 2022-04-06 DIAGNOSIS — G309 Alzheimer's disease, unspecified: Secondary | ICD-10-CM | POA: Diagnosis not present

## 2022-04-06 DIAGNOSIS — I69354 Hemiplegia and hemiparesis following cerebral infarction affecting left non-dominant side: Secondary | ICD-10-CM | POA: Diagnosis not present

## 2022-04-06 DIAGNOSIS — R634 Abnormal weight loss: Secondary | ICD-10-CM | POA: Diagnosis not present

## 2022-04-06 DIAGNOSIS — I10 Essential (primary) hypertension: Secondary | ICD-10-CM | POA: Diagnosis not present

## 2022-04-06 DIAGNOSIS — E43 Unspecified severe protein-calorie malnutrition: Secondary | ICD-10-CM | POA: Diagnosis not present

## 2022-04-06 DIAGNOSIS — F02811 Dementia in other diseases classified elsewhere, unspecified severity, with agitation: Secondary | ICD-10-CM | POA: Diagnosis not present

## 2022-04-07 DIAGNOSIS — R634 Abnormal weight loss: Secondary | ICD-10-CM | POA: Diagnosis not present

## 2022-04-07 DIAGNOSIS — I10 Essential (primary) hypertension: Secondary | ICD-10-CM | POA: Diagnosis not present

## 2022-04-07 DIAGNOSIS — I69354 Hemiplegia and hemiparesis following cerebral infarction affecting left non-dominant side: Secondary | ICD-10-CM | POA: Diagnosis not present

## 2022-04-07 DIAGNOSIS — G309 Alzheimer's disease, unspecified: Secondary | ICD-10-CM | POA: Diagnosis not present

## 2022-04-07 DIAGNOSIS — E43 Unspecified severe protein-calorie malnutrition: Secondary | ICD-10-CM | POA: Diagnosis not present

## 2022-04-07 DIAGNOSIS — F02811 Dementia in other diseases classified elsewhere, unspecified severity, with agitation: Secondary | ICD-10-CM | POA: Diagnosis not present

## 2022-04-08 DIAGNOSIS — G309 Alzheimer's disease, unspecified: Secondary | ICD-10-CM | POA: Diagnosis not present

## 2022-04-08 DIAGNOSIS — R634 Abnormal weight loss: Secondary | ICD-10-CM | POA: Diagnosis not present

## 2022-04-08 DIAGNOSIS — Z853 Personal history of malignant neoplasm of breast: Secondary | ICD-10-CM | POA: Diagnosis not present

## 2022-04-08 DIAGNOSIS — E43 Unspecified severe protein-calorie malnutrition: Secondary | ICD-10-CM | POA: Diagnosis not present

## 2022-04-08 DIAGNOSIS — Z681 Body mass index (BMI) 19 or less, adult: Secondary | ICD-10-CM | POA: Diagnosis not present

## 2022-04-08 DIAGNOSIS — L89152 Pressure ulcer of sacral region, stage 2: Secondary | ICD-10-CM | POA: Diagnosis not present

## 2022-04-08 DIAGNOSIS — I10 Essential (primary) hypertension: Secondary | ICD-10-CM | POA: Diagnosis not present

## 2022-04-08 DIAGNOSIS — F02811 Dementia in other diseases classified elsewhere, unspecified severity, with agitation: Secondary | ICD-10-CM | POA: Diagnosis not present

## 2022-04-08 DIAGNOSIS — R64 Cachexia: Secondary | ICD-10-CM | POA: Diagnosis not present

## 2022-04-08 DIAGNOSIS — Z7401 Bed confinement status: Secondary | ICD-10-CM | POA: Diagnosis not present

## 2022-04-08 DIAGNOSIS — Z9981 Dependence on supplemental oxygen: Secondary | ICD-10-CM | POA: Diagnosis not present

## 2022-04-08 DIAGNOSIS — Z741 Need for assistance with personal care: Secondary | ICD-10-CM | POA: Diagnosis not present

## 2022-04-08 DIAGNOSIS — I69354 Hemiplegia and hemiparesis following cerebral infarction affecting left non-dominant side: Secondary | ICD-10-CM | POA: Diagnosis not present

## 2022-04-10 DIAGNOSIS — F02811 Dementia in other diseases classified elsewhere, unspecified severity, with agitation: Secondary | ICD-10-CM | POA: Diagnosis not present

## 2022-04-10 DIAGNOSIS — I69354 Hemiplegia and hemiparesis following cerebral infarction affecting left non-dominant side: Secondary | ICD-10-CM | POA: Diagnosis not present

## 2022-04-10 DIAGNOSIS — G309 Alzheimer's disease, unspecified: Secondary | ICD-10-CM | POA: Diagnosis not present

## 2022-04-10 DIAGNOSIS — R634 Abnormal weight loss: Secondary | ICD-10-CM | POA: Diagnosis not present

## 2022-04-10 DIAGNOSIS — I10 Essential (primary) hypertension: Secondary | ICD-10-CM | POA: Diagnosis not present

## 2022-04-10 DIAGNOSIS — E43 Unspecified severe protein-calorie malnutrition: Secondary | ICD-10-CM | POA: Diagnosis not present

## 2022-04-13 DIAGNOSIS — G309 Alzheimer's disease, unspecified: Secondary | ICD-10-CM | POA: Diagnosis not present

## 2022-04-13 DIAGNOSIS — E43 Unspecified severe protein-calorie malnutrition: Secondary | ICD-10-CM | POA: Diagnosis not present

## 2022-04-13 DIAGNOSIS — R634 Abnormal weight loss: Secondary | ICD-10-CM | POA: Diagnosis not present

## 2022-04-13 DIAGNOSIS — I69354 Hemiplegia and hemiparesis following cerebral infarction affecting left non-dominant side: Secondary | ICD-10-CM | POA: Diagnosis not present

## 2022-04-13 DIAGNOSIS — I10 Essential (primary) hypertension: Secondary | ICD-10-CM | POA: Diagnosis not present

## 2022-04-13 DIAGNOSIS — F02811 Dementia in other diseases classified elsewhere, unspecified severity, with agitation: Secondary | ICD-10-CM | POA: Diagnosis not present

## 2022-04-17 DIAGNOSIS — R634 Abnormal weight loss: Secondary | ICD-10-CM | POA: Diagnosis not present

## 2022-04-17 DIAGNOSIS — I10 Essential (primary) hypertension: Secondary | ICD-10-CM | POA: Diagnosis not present

## 2022-04-17 DIAGNOSIS — G309 Alzheimer's disease, unspecified: Secondary | ICD-10-CM | POA: Diagnosis not present

## 2022-04-17 DIAGNOSIS — I69354 Hemiplegia and hemiparesis following cerebral infarction affecting left non-dominant side: Secondary | ICD-10-CM | POA: Diagnosis not present

## 2022-04-17 DIAGNOSIS — E43 Unspecified severe protein-calorie malnutrition: Secondary | ICD-10-CM | POA: Diagnosis not present

## 2022-04-17 DIAGNOSIS — F02811 Dementia in other diseases classified elsewhere, unspecified severity, with agitation: Secondary | ICD-10-CM | POA: Diagnosis not present

## 2022-04-20 DIAGNOSIS — I69354 Hemiplegia and hemiparesis following cerebral infarction affecting left non-dominant side: Secondary | ICD-10-CM | POA: Diagnosis not present

## 2022-04-20 DIAGNOSIS — F02811 Dementia in other diseases classified elsewhere, unspecified severity, with agitation: Secondary | ICD-10-CM | POA: Diagnosis not present

## 2022-04-20 DIAGNOSIS — I10 Essential (primary) hypertension: Secondary | ICD-10-CM | POA: Diagnosis not present

## 2022-04-20 DIAGNOSIS — E43 Unspecified severe protein-calorie malnutrition: Secondary | ICD-10-CM | POA: Diagnosis not present

## 2022-04-20 DIAGNOSIS — R634 Abnormal weight loss: Secondary | ICD-10-CM | POA: Diagnosis not present

## 2022-04-20 DIAGNOSIS — G309 Alzheimer's disease, unspecified: Secondary | ICD-10-CM | POA: Diagnosis not present

## 2022-04-24 DIAGNOSIS — E43 Unspecified severe protein-calorie malnutrition: Secondary | ICD-10-CM | POA: Diagnosis not present

## 2022-04-24 DIAGNOSIS — F02811 Dementia in other diseases classified elsewhere, unspecified severity, with agitation: Secondary | ICD-10-CM | POA: Diagnosis not present

## 2022-04-24 DIAGNOSIS — G309 Alzheimer's disease, unspecified: Secondary | ICD-10-CM | POA: Diagnosis not present

## 2022-04-24 DIAGNOSIS — I69354 Hemiplegia and hemiparesis following cerebral infarction affecting left non-dominant side: Secondary | ICD-10-CM | POA: Diagnosis not present

## 2022-04-24 DIAGNOSIS — R634 Abnormal weight loss: Secondary | ICD-10-CM | POA: Diagnosis not present

## 2022-04-24 DIAGNOSIS — I10 Essential (primary) hypertension: Secondary | ICD-10-CM | POA: Diagnosis not present

## 2022-04-27 DIAGNOSIS — I10 Essential (primary) hypertension: Secondary | ICD-10-CM | POA: Diagnosis not present

## 2022-04-27 DIAGNOSIS — G309 Alzheimer's disease, unspecified: Secondary | ICD-10-CM | POA: Diagnosis not present

## 2022-04-27 DIAGNOSIS — F02811 Dementia in other diseases classified elsewhere, unspecified severity, with agitation: Secondary | ICD-10-CM | POA: Diagnosis not present

## 2022-04-27 DIAGNOSIS — R634 Abnormal weight loss: Secondary | ICD-10-CM | POA: Diagnosis not present

## 2022-04-27 DIAGNOSIS — I69354 Hemiplegia and hemiparesis following cerebral infarction affecting left non-dominant side: Secondary | ICD-10-CM | POA: Diagnosis not present

## 2022-04-27 DIAGNOSIS — E43 Unspecified severe protein-calorie malnutrition: Secondary | ICD-10-CM | POA: Diagnosis not present

## 2022-05-01 DIAGNOSIS — G309 Alzheimer's disease, unspecified: Secondary | ICD-10-CM | POA: Diagnosis not present

## 2022-05-01 DIAGNOSIS — I10 Essential (primary) hypertension: Secondary | ICD-10-CM | POA: Diagnosis not present

## 2022-05-01 DIAGNOSIS — F02811 Dementia in other diseases classified elsewhere, unspecified severity, with agitation: Secondary | ICD-10-CM | POA: Diagnosis not present

## 2022-05-01 DIAGNOSIS — I69354 Hemiplegia and hemiparesis following cerebral infarction affecting left non-dominant side: Secondary | ICD-10-CM | POA: Diagnosis not present

## 2022-05-01 DIAGNOSIS — E43 Unspecified severe protein-calorie malnutrition: Secondary | ICD-10-CM | POA: Diagnosis not present

## 2022-05-01 DIAGNOSIS — R634 Abnormal weight loss: Secondary | ICD-10-CM | POA: Diagnosis not present

## 2022-05-04 DIAGNOSIS — I10 Essential (primary) hypertension: Secondary | ICD-10-CM | POA: Diagnosis not present

## 2022-05-04 DIAGNOSIS — G309 Alzheimer's disease, unspecified: Secondary | ICD-10-CM | POA: Diagnosis not present

## 2022-05-04 DIAGNOSIS — I69354 Hemiplegia and hemiparesis following cerebral infarction affecting left non-dominant side: Secondary | ICD-10-CM | POA: Diagnosis not present

## 2022-05-04 DIAGNOSIS — R634 Abnormal weight loss: Secondary | ICD-10-CM | POA: Diagnosis not present

## 2022-05-04 DIAGNOSIS — F02811 Dementia in other diseases classified elsewhere, unspecified severity, with agitation: Secondary | ICD-10-CM | POA: Diagnosis not present

## 2022-05-04 DIAGNOSIS — E43 Unspecified severe protein-calorie malnutrition: Secondary | ICD-10-CM | POA: Diagnosis not present

## 2022-05-08 DIAGNOSIS — Z741 Need for assistance with personal care: Secondary | ICD-10-CM | POA: Diagnosis not present

## 2022-05-08 DIAGNOSIS — Z853 Personal history of malignant neoplasm of breast: Secondary | ICD-10-CM | POA: Diagnosis not present

## 2022-05-08 DIAGNOSIS — G309 Alzheimer's disease, unspecified: Secondary | ICD-10-CM | POA: Diagnosis not present

## 2022-05-08 DIAGNOSIS — Z9981 Dependence on supplemental oxygen: Secondary | ICD-10-CM | POA: Diagnosis not present

## 2022-05-08 DIAGNOSIS — F02811 Dementia in other diseases classified elsewhere, unspecified severity, with agitation: Secondary | ICD-10-CM | POA: Diagnosis not present

## 2022-05-08 DIAGNOSIS — I69354 Hemiplegia and hemiparesis following cerebral infarction affecting left non-dominant side: Secondary | ICD-10-CM | POA: Diagnosis not present

## 2022-05-08 DIAGNOSIS — R634 Abnormal weight loss: Secondary | ICD-10-CM | POA: Diagnosis not present

## 2022-05-08 DIAGNOSIS — I10 Essential (primary) hypertension: Secondary | ICD-10-CM | POA: Diagnosis not present

## 2022-05-08 DIAGNOSIS — E43 Unspecified severe protein-calorie malnutrition: Secondary | ICD-10-CM | POA: Diagnosis not present

## 2022-05-08 DIAGNOSIS — L89152 Pressure ulcer of sacral region, stage 2: Secondary | ICD-10-CM | POA: Diagnosis not present

## 2022-05-08 DIAGNOSIS — Z681 Body mass index (BMI) 19 or less, adult: Secondary | ICD-10-CM | POA: Diagnosis not present

## 2022-05-08 DIAGNOSIS — R64 Cachexia: Secondary | ICD-10-CM | POA: Diagnosis not present

## 2022-05-08 DIAGNOSIS — Z7401 Bed confinement status: Secondary | ICD-10-CM | POA: Diagnosis not present

## 2022-05-11 DIAGNOSIS — I69354 Hemiplegia and hemiparesis following cerebral infarction affecting left non-dominant side: Secondary | ICD-10-CM | POA: Diagnosis not present

## 2022-05-11 DIAGNOSIS — R634 Abnormal weight loss: Secondary | ICD-10-CM | POA: Diagnosis not present

## 2022-05-11 DIAGNOSIS — E43 Unspecified severe protein-calorie malnutrition: Secondary | ICD-10-CM | POA: Diagnosis not present

## 2022-05-11 DIAGNOSIS — G309 Alzheimer's disease, unspecified: Secondary | ICD-10-CM | POA: Diagnosis not present

## 2022-05-11 DIAGNOSIS — F02811 Dementia in other diseases classified elsewhere, unspecified severity, with agitation: Secondary | ICD-10-CM | POA: Diagnosis not present

## 2022-05-11 DIAGNOSIS — I10 Essential (primary) hypertension: Secondary | ICD-10-CM | POA: Diagnosis not present

## 2022-05-15 DIAGNOSIS — I10 Essential (primary) hypertension: Secondary | ICD-10-CM | POA: Diagnosis not present

## 2022-05-15 DIAGNOSIS — R634 Abnormal weight loss: Secondary | ICD-10-CM | POA: Diagnosis not present

## 2022-05-15 DIAGNOSIS — G309 Alzheimer's disease, unspecified: Secondary | ICD-10-CM | POA: Diagnosis not present

## 2022-05-15 DIAGNOSIS — F02811 Dementia in other diseases classified elsewhere, unspecified severity, with agitation: Secondary | ICD-10-CM | POA: Diagnosis not present

## 2022-05-15 DIAGNOSIS — I69354 Hemiplegia and hemiparesis following cerebral infarction affecting left non-dominant side: Secondary | ICD-10-CM | POA: Diagnosis not present

## 2022-05-15 DIAGNOSIS — E43 Unspecified severe protein-calorie malnutrition: Secondary | ICD-10-CM | POA: Diagnosis not present

## 2022-05-18 DIAGNOSIS — F02811 Dementia in other diseases classified elsewhere, unspecified severity, with agitation: Secondary | ICD-10-CM | POA: Diagnosis not present

## 2022-05-18 DIAGNOSIS — E43 Unspecified severe protein-calorie malnutrition: Secondary | ICD-10-CM | POA: Diagnosis not present

## 2022-05-18 DIAGNOSIS — G309 Alzheimer's disease, unspecified: Secondary | ICD-10-CM | POA: Diagnosis not present

## 2022-05-18 DIAGNOSIS — I10 Essential (primary) hypertension: Secondary | ICD-10-CM | POA: Diagnosis not present

## 2022-05-18 DIAGNOSIS — I69354 Hemiplegia and hemiparesis following cerebral infarction affecting left non-dominant side: Secondary | ICD-10-CM | POA: Diagnosis not present

## 2022-05-18 DIAGNOSIS — R634 Abnormal weight loss: Secondary | ICD-10-CM | POA: Diagnosis not present

## 2022-05-22 DIAGNOSIS — I10 Essential (primary) hypertension: Secondary | ICD-10-CM | POA: Diagnosis not present

## 2022-05-22 DIAGNOSIS — E43 Unspecified severe protein-calorie malnutrition: Secondary | ICD-10-CM | POA: Diagnosis not present

## 2022-05-22 DIAGNOSIS — R634 Abnormal weight loss: Secondary | ICD-10-CM | POA: Diagnosis not present

## 2022-05-22 DIAGNOSIS — F02811 Dementia in other diseases classified elsewhere, unspecified severity, with agitation: Secondary | ICD-10-CM | POA: Diagnosis not present

## 2022-05-22 DIAGNOSIS — G309 Alzheimer's disease, unspecified: Secondary | ICD-10-CM | POA: Diagnosis not present

## 2022-05-22 DIAGNOSIS — I69354 Hemiplegia and hemiparesis following cerebral infarction affecting left non-dominant side: Secondary | ICD-10-CM | POA: Diagnosis not present

## 2022-05-25 DIAGNOSIS — G309 Alzheimer's disease, unspecified: Secondary | ICD-10-CM | POA: Diagnosis not present

## 2022-05-25 DIAGNOSIS — I10 Essential (primary) hypertension: Secondary | ICD-10-CM | POA: Diagnosis not present

## 2022-05-25 DIAGNOSIS — F02811 Dementia in other diseases classified elsewhere, unspecified severity, with agitation: Secondary | ICD-10-CM | POA: Diagnosis not present

## 2022-05-25 DIAGNOSIS — R634 Abnormal weight loss: Secondary | ICD-10-CM | POA: Diagnosis not present

## 2022-05-25 DIAGNOSIS — I69354 Hemiplegia and hemiparesis following cerebral infarction affecting left non-dominant side: Secondary | ICD-10-CM | POA: Diagnosis not present

## 2022-05-25 DIAGNOSIS — E43 Unspecified severe protein-calorie malnutrition: Secondary | ICD-10-CM | POA: Diagnosis not present

## 2022-05-29 DIAGNOSIS — E43 Unspecified severe protein-calorie malnutrition: Secondary | ICD-10-CM | POA: Diagnosis not present

## 2022-05-29 DIAGNOSIS — G309 Alzheimer's disease, unspecified: Secondary | ICD-10-CM | POA: Diagnosis not present

## 2022-05-29 DIAGNOSIS — R634 Abnormal weight loss: Secondary | ICD-10-CM | POA: Diagnosis not present

## 2022-05-29 DIAGNOSIS — I69354 Hemiplegia and hemiparesis following cerebral infarction affecting left non-dominant side: Secondary | ICD-10-CM | POA: Diagnosis not present

## 2022-05-29 DIAGNOSIS — I10 Essential (primary) hypertension: Secondary | ICD-10-CM | POA: Diagnosis not present

## 2022-05-29 DIAGNOSIS — F02811 Dementia in other diseases classified elsewhere, unspecified severity, with agitation: Secondary | ICD-10-CM | POA: Diagnosis not present

## 2022-06-02 DIAGNOSIS — E43 Unspecified severe protein-calorie malnutrition: Secondary | ICD-10-CM | POA: Diagnosis not present

## 2022-06-02 DIAGNOSIS — I10 Essential (primary) hypertension: Secondary | ICD-10-CM | POA: Diagnosis not present

## 2022-06-02 DIAGNOSIS — R634 Abnormal weight loss: Secondary | ICD-10-CM | POA: Diagnosis not present

## 2022-06-02 DIAGNOSIS — F02811 Dementia in other diseases classified elsewhere, unspecified severity, with agitation: Secondary | ICD-10-CM | POA: Diagnosis not present

## 2022-06-02 DIAGNOSIS — G309 Alzheimer's disease, unspecified: Secondary | ICD-10-CM | POA: Diagnosis not present

## 2022-06-02 DIAGNOSIS — I69354 Hemiplegia and hemiparesis following cerebral infarction affecting left non-dominant side: Secondary | ICD-10-CM | POA: Diagnosis not present

## 2022-06-05 DIAGNOSIS — E43 Unspecified severe protein-calorie malnutrition: Secondary | ICD-10-CM | POA: Diagnosis not present

## 2022-06-05 DIAGNOSIS — F02811 Dementia in other diseases classified elsewhere, unspecified severity, with agitation: Secondary | ICD-10-CM | POA: Diagnosis not present

## 2022-06-05 DIAGNOSIS — G309 Alzheimer's disease, unspecified: Secondary | ICD-10-CM | POA: Diagnosis not present

## 2022-06-05 DIAGNOSIS — R634 Abnormal weight loss: Secondary | ICD-10-CM | POA: Diagnosis not present

## 2022-06-05 DIAGNOSIS — I10 Essential (primary) hypertension: Secondary | ICD-10-CM | POA: Diagnosis not present

## 2022-06-05 DIAGNOSIS — I69354 Hemiplegia and hemiparesis following cerebral infarction affecting left non-dominant side: Secondary | ICD-10-CM | POA: Diagnosis not present

## 2022-06-08 DIAGNOSIS — L89152 Pressure ulcer of sacral region, stage 2: Secondary | ICD-10-CM | POA: Diagnosis not present

## 2022-06-08 DIAGNOSIS — Z741 Need for assistance with personal care: Secondary | ICD-10-CM | POA: Diagnosis not present

## 2022-06-08 DIAGNOSIS — I69354 Hemiplegia and hemiparesis following cerebral infarction affecting left non-dominant side: Secondary | ICD-10-CM | POA: Diagnosis not present

## 2022-06-08 DIAGNOSIS — Z853 Personal history of malignant neoplasm of breast: Secondary | ICD-10-CM | POA: Diagnosis not present

## 2022-06-08 DIAGNOSIS — F02811 Dementia in other diseases classified elsewhere, unspecified severity, with agitation: Secondary | ICD-10-CM | POA: Diagnosis not present

## 2022-06-08 DIAGNOSIS — Z7401 Bed confinement status: Secondary | ICD-10-CM | POA: Diagnosis not present

## 2022-06-08 DIAGNOSIS — I10 Essential (primary) hypertension: Secondary | ICD-10-CM | POA: Diagnosis not present

## 2022-06-08 DIAGNOSIS — E43 Unspecified severe protein-calorie malnutrition: Secondary | ICD-10-CM | POA: Diagnosis not present

## 2022-06-08 DIAGNOSIS — R634 Abnormal weight loss: Secondary | ICD-10-CM | POA: Diagnosis not present

## 2022-06-08 DIAGNOSIS — Z681 Body mass index (BMI) 19 or less, adult: Secondary | ICD-10-CM | POA: Diagnosis not present

## 2022-06-08 DIAGNOSIS — R64 Cachexia: Secondary | ICD-10-CM | POA: Diagnosis not present

## 2022-06-08 DIAGNOSIS — G309 Alzheimer's disease, unspecified: Secondary | ICD-10-CM | POA: Diagnosis not present

## 2022-06-08 DIAGNOSIS — Z9981 Dependence on supplemental oxygen: Secondary | ICD-10-CM | POA: Diagnosis not present

## 2022-06-12 DIAGNOSIS — F02811 Dementia in other diseases classified elsewhere, unspecified severity, with agitation: Secondary | ICD-10-CM | POA: Diagnosis not present

## 2022-06-12 DIAGNOSIS — R634 Abnormal weight loss: Secondary | ICD-10-CM | POA: Diagnosis not present

## 2022-06-12 DIAGNOSIS — E43 Unspecified severe protein-calorie malnutrition: Secondary | ICD-10-CM | POA: Diagnosis not present

## 2022-06-12 DIAGNOSIS — I10 Essential (primary) hypertension: Secondary | ICD-10-CM | POA: Diagnosis not present

## 2022-06-12 DIAGNOSIS — G309 Alzheimer's disease, unspecified: Secondary | ICD-10-CM | POA: Diagnosis not present

## 2022-06-12 DIAGNOSIS — I69354 Hemiplegia and hemiparesis following cerebral infarction affecting left non-dominant side: Secondary | ICD-10-CM | POA: Diagnosis not present

## 2022-06-15 DIAGNOSIS — E43 Unspecified severe protein-calorie malnutrition: Secondary | ICD-10-CM | POA: Diagnosis not present

## 2022-06-15 DIAGNOSIS — I69354 Hemiplegia and hemiparesis following cerebral infarction affecting left non-dominant side: Secondary | ICD-10-CM | POA: Diagnosis not present

## 2022-06-15 DIAGNOSIS — F02811 Dementia in other diseases classified elsewhere, unspecified severity, with agitation: Secondary | ICD-10-CM | POA: Diagnosis not present

## 2022-06-15 DIAGNOSIS — G309 Alzheimer's disease, unspecified: Secondary | ICD-10-CM | POA: Diagnosis not present

## 2022-06-15 DIAGNOSIS — I10 Essential (primary) hypertension: Secondary | ICD-10-CM | POA: Diagnosis not present

## 2022-06-15 DIAGNOSIS — R634 Abnormal weight loss: Secondary | ICD-10-CM | POA: Diagnosis not present

## 2022-06-19 DIAGNOSIS — F02811 Dementia in other diseases classified elsewhere, unspecified severity, with agitation: Secondary | ICD-10-CM | POA: Diagnosis not present

## 2022-06-19 DIAGNOSIS — R634 Abnormal weight loss: Secondary | ICD-10-CM | POA: Diagnosis not present

## 2022-06-19 DIAGNOSIS — E43 Unspecified severe protein-calorie malnutrition: Secondary | ICD-10-CM | POA: Diagnosis not present

## 2022-06-19 DIAGNOSIS — G309 Alzheimer's disease, unspecified: Secondary | ICD-10-CM | POA: Diagnosis not present

## 2022-06-19 DIAGNOSIS — I69354 Hemiplegia and hemiparesis following cerebral infarction affecting left non-dominant side: Secondary | ICD-10-CM | POA: Diagnosis not present

## 2022-06-19 DIAGNOSIS — I10 Essential (primary) hypertension: Secondary | ICD-10-CM | POA: Diagnosis not present

## 2022-06-23 DIAGNOSIS — G309 Alzheimer's disease, unspecified: Secondary | ICD-10-CM | POA: Diagnosis not present

## 2022-06-23 DIAGNOSIS — E43 Unspecified severe protein-calorie malnutrition: Secondary | ICD-10-CM | POA: Diagnosis not present

## 2022-06-23 DIAGNOSIS — F02811 Dementia in other diseases classified elsewhere, unspecified severity, with agitation: Secondary | ICD-10-CM | POA: Diagnosis not present

## 2022-06-23 DIAGNOSIS — R634 Abnormal weight loss: Secondary | ICD-10-CM | POA: Diagnosis not present

## 2022-06-23 DIAGNOSIS — I10 Essential (primary) hypertension: Secondary | ICD-10-CM | POA: Diagnosis not present

## 2022-06-23 DIAGNOSIS — I69354 Hemiplegia and hemiparesis following cerebral infarction affecting left non-dominant side: Secondary | ICD-10-CM | POA: Diagnosis not present

## 2022-06-26 DIAGNOSIS — I69354 Hemiplegia and hemiparesis following cerebral infarction affecting left non-dominant side: Secondary | ICD-10-CM | POA: Diagnosis not present

## 2022-06-26 DIAGNOSIS — F02811 Dementia in other diseases classified elsewhere, unspecified severity, with agitation: Secondary | ICD-10-CM | POA: Diagnosis not present

## 2022-06-26 DIAGNOSIS — R634 Abnormal weight loss: Secondary | ICD-10-CM | POA: Diagnosis not present

## 2022-06-26 DIAGNOSIS — E43 Unspecified severe protein-calorie malnutrition: Secondary | ICD-10-CM | POA: Diagnosis not present

## 2022-06-26 DIAGNOSIS — I10 Essential (primary) hypertension: Secondary | ICD-10-CM | POA: Diagnosis not present

## 2022-06-26 DIAGNOSIS — G309 Alzheimer's disease, unspecified: Secondary | ICD-10-CM | POA: Diagnosis not present

## 2022-06-29 DIAGNOSIS — I10 Essential (primary) hypertension: Secondary | ICD-10-CM | POA: Diagnosis not present

## 2022-06-29 DIAGNOSIS — R634 Abnormal weight loss: Secondary | ICD-10-CM | POA: Diagnosis not present

## 2022-06-29 DIAGNOSIS — F02811 Dementia in other diseases classified elsewhere, unspecified severity, with agitation: Secondary | ICD-10-CM | POA: Diagnosis not present

## 2022-06-29 DIAGNOSIS — G309 Alzheimer's disease, unspecified: Secondary | ICD-10-CM | POA: Diagnosis not present

## 2022-06-29 DIAGNOSIS — I69354 Hemiplegia and hemiparesis following cerebral infarction affecting left non-dominant side: Secondary | ICD-10-CM | POA: Diagnosis not present

## 2022-06-29 DIAGNOSIS — E43 Unspecified severe protein-calorie malnutrition: Secondary | ICD-10-CM | POA: Diagnosis not present

## 2022-07-03 DIAGNOSIS — I10 Essential (primary) hypertension: Secondary | ICD-10-CM | POA: Diagnosis not present

## 2022-07-03 DIAGNOSIS — I69354 Hemiplegia and hemiparesis following cerebral infarction affecting left non-dominant side: Secondary | ICD-10-CM | POA: Diagnosis not present

## 2022-07-03 DIAGNOSIS — E43 Unspecified severe protein-calorie malnutrition: Secondary | ICD-10-CM | POA: Diagnosis not present

## 2022-07-03 DIAGNOSIS — R634 Abnormal weight loss: Secondary | ICD-10-CM | POA: Diagnosis not present

## 2022-07-03 DIAGNOSIS — G309 Alzheimer's disease, unspecified: Secondary | ICD-10-CM | POA: Diagnosis not present

## 2022-07-03 DIAGNOSIS — F02811 Dementia in other diseases classified elsewhere, unspecified severity, with agitation: Secondary | ICD-10-CM | POA: Diagnosis not present

## 2022-07-06 DIAGNOSIS — F02811 Dementia in other diseases classified elsewhere, unspecified severity, with agitation: Secondary | ICD-10-CM | POA: Diagnosis not present

## 2022-07-06 DIAGNOSIS — I69354 Hemiplegia and hemiparesis following cerebral infarction affecting left non-dominant side: Secondary | ICD-10-CM | POA: Diagnosis not present

## 2022-07-06 DIAGNOSIS — G309 Alzheimer's disease, unspecified: Secondary | ICD-10-CM | POA: Diagnosis not present

## 2022-07-06 DIAGNOSIS — R634 Abnormal weight loss: Secondary | ICD-10-CM | POA: Diagnosis not present

## 2022-07-06 DIAGNOSIS — E43 Unspecified severe protein-calorie malnutrition: Secondary | ICD-10-CM | POA: Diagnosis not present

## 2022-07-06 DIAGNOSIS — I10 Essential (primary) hypertension: Secondary | ICD-10-CM | POA: Diagnosis not present

## 2022-07-09 DIAGNOSIS — G309 Alzheimer's disease, unspecified: Secondary | ICD-10-CM | POA: Diagnosis not present

## 2022-07-09 DIAGNOSIS — F0282 Dementia in other diseases classified elsewhere, unspecified severity, with psychotic disturbance: Secondary | ICD-10-CM | POA: Diagnosis not present

## 2022-07-09 DIAGNOSIS — L89152 Pressure ulcer of sacral region, stage 2: Secondary | ICD-10-CM | POA: Diagnosis not present

## 2022-07-09 DIAGNOSIS — Z741 Need for assistance with personal care: Secondary | ICD-10-CM | POA: Diagnosis not present

## 2022-07-09 DIAGNOSIS — E43 Unspecified severe protein-calorie malnutrition: Secondary | ICD-10-CM | POA: Diagnosis not present

## 2022-07-09 DIAGNOSIS — Z9981 Dependence on supplemental oxygen: Secondary | ICD-10-CM | POA: Diagnosis not present

## 2022-07-09 DIAGNOSIS — I69354 Hemiplegia and hemiparesis following cerebral infarction affecting left non-dominant side: Secondary | ICD-10-CM | POA: Diagnosis not present

## 2022-07-09 DIAGNOSIS — I10 Essential (primary) hypertension: Secondary | ICD-10-CM | POA: Diagnosis not present

## 2022-07-09 DIAGNOSIS — Z7401 Bed confinement status: Secondary | ICD-10-CM | POA: Diagnosis not present

## 2022-07-09 DIAGNOSIS — Z853 Personal history of malignant neoplasm of breast: Secondary | ICD-10-CM | POA: Diagnosis not present

## 2022-07-09 DIAGNOSIS — R634 Abnormal weight loss: Secondary | ICD-10-CM | POA: Diagnosis not present

## 2022-07-09 DIAGNOSIS — F02811 Dementia in other diseases classified elsewhere, unspecified severity, with agitation: Secondary | ICD-10-CM | POA: Diagnosis not present

## 2022-07-09 DIAGNOSIS — Z681 Body mass index (BMI) 19 or less, adult: Secondary | ICD-10-CM | POA: Diagnosis not present

## 2022-07-09 DIAGNOSIS — R64 Cachexia: Secondary | ICD-10-CM | POA: Diagnosis not present

## 2022-07-10 DIAGNOSIS — I69354 Hemiplegia and hemiparesis following cerebral infarction affecting left non-dominant side: Secondary | ICD-10-CM | POA: Diagnosis not present

## 2022-07-10 DIAGNOSIS — G309 Alzheimer's disease, unspecified: Secondary | ICD-10-CM | POA: Diagnosis not present

## 2022-07-10 DIAGNOSIS — E43 Unspecified severe protein-calorie malnutrition: Secondary | ICD-10-CM | POA: Diagnosis not present

## 2022-07-10 DIAGNOSIS — I10 Essential (primary) hypertension: Secondary | ICD-10-CM | POA: Diagnosis not present

## 2022-07-10 DIAGNOSIS — F0282 Dementia in other diseases classified elsewhere, unspecified severity, with psychotic disturbance: Secondary | ICD-10-CM | POA: Diagnosis not present

## 2022-07-10 DIAGNOSIS — F02811 Dementia in other diseases classified elsewhere, unspecified severity, with agitation: Secondary | ICD-10-CM | POA: Diagnosis not present

## 2022-07-13 DIAGNOSIS — F0282 Dementia in other diseases classified elsewhere, unspecified severity, with psychotic disturbance: Secondary | ICD-10-CM | POA: Diagnosis not present

## 2022-07-13 DIAGNOSIS — E43 Unspecified severe protein-calorie malnutrition: Secondary | ICD-10-CM | POA: Diagnosis not present

## 2022-07-13 DIAGNOSIS — F02811 Dementia in other diseases classified elsewhere, unspecified severity, with agitation: Secondary | ICD-10-CM | POA: Diagnosis not present

## 2022-07-13 DIAGNOSIS — I69354 Hemiplegia and hemiparesis following cerebral infarction affecting left non-dominant side: Secondary | ICD-10-CM | POA: Diagnosis not present

## 2022-07-13 DIAGNOSIS — G309 Alzheimer's disease, unspecified: Secondary | ICD-10-CM | POA: Diagnosis not present

## 2022-07-13 DIAGNOSIS — I10 Essential (primary) hypertension: Secondary | ICD-10-CM | POA: Diagnosis not present

## 2022-07-17 DIAGNOSIS — F02811 Dementia in other diseases classified elsewhere, unspecified severity, with agitation: Secondary | ICD-10-CM | POA: Diagnosis not present

## 2022-07-17 DIAGNOSIS — I69354 Hemiplegia and hemiparesis following cerebral infarction affecting left non-dominant side: Secondary | ICD-10-CM | POA: Diagnosis not present

## 2022-07-17 DIAGNOSIS — E43 Unspecified severe protein-calorie malnutrition: Secondary | ICD-10-CM | POA: Diagnosis not present

## 2022-07-17 DIAGNOSIS — I10 Essential (primary) hypertension: Secondary | ICD-10-CM | POA: Diagnosis not present

## 2022-07-17 DIAGNOSIS — G309 Alzheimer's disease, unspecified: Secondary | ICD-10-CM | POA: Diagnosis not present

## 2022-07-17 DIAGNOSIS — F0282 Dementia in other diseases classified elsewhere, unspecified severity, with psychotic disturbance: Secondary | ICD-10-CM | POA: Diagnosis not present

## 2022-07-21 DIAGNOSIS — E43 Unspecified severe protein-calorie malnutrition: Secondary | ICD-10-CM | POA: Diagnosis not present

## 2022-07-21 DIAGNOSIS — F02811 Dementia in other diseases classified elsewhere, unspecified severity, with agitation: Secondary | ICD-10-CM | POA: Diagnosis not present

## 2022-07-21 DIAGNOSIS — F0282 Dementia in other diseases classified elsewhere, unspecified severity, with psychotic disturbance: Secondary | ICD-10-CM | POA: Diagnosis not present

## 2022-07-21 DIAGNOSIS — I69354 Hemiplegia and hemiparesis following cerebral infarction affecting left non-dominant side: Secondary | ICD-10-CM | POA: Diagnosis not present

## 2022-07-21 DIAGNOSIS — I10 Essential (primary) hypertension: Secondary | ICD-10-CM | POA: Diagnosis not present

## 2022-07-21 DIAGNOSIS — G309 Alzheimer's disease, unspecified: Secondary | ICD-10-CM | POA: Diagnosis not present

## 2022-07-24 DIAGNOSIS — I10 Essential (primary) hypertension: Secondary | ICD-10-CM | POA: Diagnosis not present

## 2022-07-24 DIAGNOSIS — F02811 Dementia in other diseases classified elsewhere, unspecified severity, with agitation: Secondary | ICD-10-CM | POA: Diagnosis not present

## 2022-07-24 DIAGNOSIS — G309 Alzheimer's disease, unspecified: Secondary | ICD-10-CM | POA: Diagnosis not present

## 2022-07-24 DIAGNOSIS — I69354 Hemiplegia and hemiparesis following cerebral infarction affecting left non-dominant side: Secondary | ICD-10-CM | POA: Diagnosis not present

## 2022-07-24 DIAGNOSIS — F0282 Dementia in other diseases classified elsewhere, unspecified severity, with psychotic disturbance: Secondary | ICD-10-CM | POA: Diagnosis not present

## 2022-07-24 DIAGNOSIS — E43 Unspecified severe protein-calorie malnutrition: Secondary | ICD-10-CM | POA: Diagnosis not present

## 2022-07-27 DIAGNOSIS — F02811 Dementia in other diseases classified elsewhere, unspecified severity, with agitation: Secondary | ICD-10-CM | POA: Diagnosis not present

## 2022-07-27 DIAGNOSIS — I69354 Hemiplegia and hemiparesis following cerebral infarction affecting left non-dominant side: Secondary | ICD-10-CM | POA: Diagnosis not present

## 2022-07-27 DIAGNOSIS — G309 Alzheimer's disease, unspecified: Secondary | ICD-10-CM | POA: Diagnosis not present

## 2022-07-27 DIAGNOSIS — E43 Unspecified severe protein-calorie malnutrition: Secondary | ICD-10-CM | POA: Diagnosis not present

## 2022-07-27 DIAGNOSIS — I10 Essential (primary) hypertension: Secondary | ICD-10-CM | POA: Diagnosis not present

## 2022-07-27 DIAGNOSIS — F0282 Dementia in other diseases classified elsewhere, unspecified severity, with psychotic disturbance: Secondary | ICD-10-CM | POA: Diagnosis not present

## 2022-07-31 DIAGNOSIS — I10 Essential (primary) hypertension: Secondary | ICD-10-CM | POA: Diagnosis not present

## 2022-07-31 DIAGNOSIS — G309 Alzheimer's disease, unspecified: Secondary | ICD-10-CM | POA: Diagnosis not present

## 2022-07-31 DIAGNOSIS — E43 Unspecified severe protein-calorie malnutrition: Secondary | ICD-10-CM | POA: Diagnosis not present

## 2022-07-31 DIAGNOSIS — F02811 Dementia in other diseases classified elsewhere, unspecified severity, with agitation: Secondary | ICD-10-CM | POA: Diagnosis not present

## 2022-07-31 DIAGNOSIS — F0282 Dementia in other diseases classified elsewhere, unspecified severity, with psychotic disturbance: Secondary | ICD-10-CM | POA: Diagnosis not present

## 2022-07-31 DIAGNOSIS — I69354 Hemiplegia and hemiparesis following cerebral infarction affecting left non-dominant side: Secondary | ICD-10-CM | POA: Diagnosis not present

## 2022-08-03 DIAGNOSIS — E43 Unspecified severe protein-calorie malnutrition: Secondary | ICD-10-CM | POA: Diagnosis not present

## 2022-08-03 DIAGNOSIS — G309 Alzheimer's disease, unspecified: Secondary | ICD-10-CM | POA: Diagnosis not present

## 2022-08-03 DIAGNOSIS — I69354 Hemiplegia and hemiparesis following cerebral infarction affecting left non-dominant side: Secondary | ICD-10-CM | POA: Diagnosis not present

## 2022-08-03 DIAGNOSIS — F02811 Dementia in other diseases classified elsewhere, unspecified severity, with agitation: Secondary | ICD-10-CM | POA: Diagnosis not present

## 2022-08-03 DIAGNOSIS — I10 Essential (primary) hypertension: Secondary | ICD-10-CM | POA: Diagnosis not present

## 2022-08-03 DIAGNOSIS — F0282 Dementia in other diseases classified elsewhere, unspecified severity, with psychotic disturbance: Secondary | ICD-10-CM | POA: Diagnosis not present

## 2022-08-07 DIAGNOSIS — F0282 Dementia in other diseases classified elsewhere, unspecified severity, with psychotic disturbance: Secondary | ICD-10-CM | POA: Diagnosis not present

## 2022-08-07 DIAGNOSIS — R634 Abnormal weight loss: Secondary | ICD-10-CM | POA: Diagnosis not present

## 2022-08-07 DIAGNOSIS — F02811 Dementia in other diseases classified elsewhere, unspecified severity, with agitation: Secondary | ICD-10-CM | POA: Diagnosis not present

## 2022-08-07 DIAGNOSIS — L89152 Pressure ulcer of sacral region, stage 2: Secondary | ICD-10-CM | POA: Diagnosis not present

## 2022-08-07 DIAGNOSIS — I69354 Hemiplegia and hemiparesis following cerebral infarction affecting left non-dominant side: Secondary | ICD-10-CM | POA: Diagnosis not present

## 2022-08-07 DIAGNOSIS — E43 Unspecified severe protein-calorie malnutrition: Secondary | ICD-10-CM | POA: Diagnosis not present

## 2022-08-07 DIAGNOSIS — R64 Cachexia: Secondary | ICD-10-CM | POA: Diagnosis not present

## 2022-08-07 DIAGNOSIS — Z853 Personal history of malignant neoplasm of breast: Secondary | ICD-10-CM | POA: Diagnosis not present

## 2022-08-07 DIAGNOSIS — G309 Alzheimer's disease, unspecified: Secondary | ICD-10-CM | POA: Diagnosis not present

## 2022-08-07 DIAGNOSIS — Z7401 Bed confinement status: Secondary | ICD-10-CM | POA: Diagnosis not present

## 2022-08-07 DIAGNOSIS — Z681 Body mass index (BMI) 19 or less, adult: Secondary | ICD-10-CM | POA: Diagnosis not present

## 2022-08-07 DIAGNOSIS — I10 Essential (primary) hypertension: Secondary | ICD-10-CM | POA: Diagnosis not present

## 2022-08-07 DIAGNOSIS — Z9981 Dependence on supplemental oxygen: Secondary | ICD-10-CM | POA: Diagnosis not present

## 2022-08-07 DIAGNOSIS — Z741 Need for assistance with personal care: Secondary | ICD-10-CM | POA: Diagnosis not present

## 2022-08-10 DIAGNOSIS — I10 Essential (primary) hypertension: Secondary | ICD-10-CM | POA: Diagnosis not present

## 2022-08-10 DIAGNOSIS — G309 Alzheimer's disease, unspecified: Secondary | ICD-10-CM | POA: Diagnosis not present

## 2022-08-10 DIAGNOSIS — I69354 Hemiplegia and hemiparesis following cerebral infarction affecting left non-dominant side: Secondary | ICD-10-CM | POA: Diagnosis not present

## 2022-08-10 DIAGNOSIS — F0282 Dementia in other diseases classified elsewhere, unspecified severity, with psychotic disturbance: Secondary | ICD-10-CM | POA: Diagnosis not present

## 2022-08-10 DIAGNOSIS — F02811 Dementia in other diseases classified elsewhere, unspecified severity, with agitation: Secondary | ICD-10-CM | POA: Diagnosis not present

## 2022-08-10 DIAGNOSIS — E43 Unspecified severe protein-calorie malnutrition: Secondary | ICD-10-CM | POA: Diagnosis not present

## 2022-08-14 DIAGNOSIS — F02811 Dementia in other diseases classified elsewhere, unspecified severity, with agitation: Secondary | ICD-10-CM | POA: Diagnosis not present

## 2022-08-14 DIAGNOSIS — E43 Unspecified severe protein-calorie malnutrition: Secondary | ICD-10-CM | POA: Diagnosis not present

## 2022-08-14 DIAGNOSIS — G309 Alzheimer's disease, unspecified: Secondary | ICD-10-CM | POA: Diagnosis not present

## 2022-08-14 DIAGNOSIS — F0282 Dementia in other diseases classified elsewhere, unspecified severity, with psychotic disturbance: Secondary | ICD-10-CM | POA: Diagnosis not present

## 2022-08-14 DIAGNOSIS — I69354 Hemiplegia and hemiparesis following cerebral infarction affecting left non-dominant side: Secondary | ICD-10-CM | POA: Diagnosis not present

## 2022-08-14 DIAGNOSIS — I10 Essential (primary) hypertension: Secondary | ICD-10-CM | POA: Diagnosis not present

## 2022-08-17 DIAGNOSIS — E43 Unspecified severe protein-calorie malnutrition: Secondary | ICD-10-CM | POA: Diagnosis not present

## 2022-08-17 DIAGNOSIS — I10 Essential (primary) hypertension: Secondary | ICD-10-CM | POA: Diagnosis not present

## 2022-08-17 DIAGNOSIS — I69354 Hemiplegia and hemiparesis following cerebral infarction affecting left non-dominant side: Secondary | ICD-10-CM | POA: Diagnosis not present

## 2022-08-17 DIAGNOSIS — F0282 Dementia in other diseases classified elsewhere, unspecified severity, with psychotic disturbance: Secondary | ICD-10-CM | POA: Diagnosis not present

## 2022-08-17 DIAGNOSIS — G309 Alzheimer's disease, unspecified: Secondary | ICD-10-CM | POA: Diagnosis not present

## 2022-08-17 DIAGNOSIS — F02811 Dementia in other diseases classified elsewhere, unspecified severity, with agitation: Secondary | ICD-10-CM | POA: Diagnosis not present

## 2022-08-21 DIAGNOSIS — F02811 Dementia in other diseases classified elsewhere, unspecified severity, with agitation: Secondary | ICD-10-CM | POA: Diagnosis not present

## 2022-08-21 DIAGNOSIS — G309 Alzheimer's disease, unspecified: Secondary | ICD-10-CM | POA: Diagnosis not present

## 2022-08-21 DIAGNOSIS — F0282 Dementia in other diseases classified elsewhere, unspecified severity, with psychotic disturbance: Secondary | ICD-10-CM | POA: Diagnosis not present

## 2022-08-21 DIAGNOSIS — E43 Unspecified severe protein-calorie malnutrition: Secondary | ICD-10-CM | POA: Diagnosis not present

## 2022-08-21 DIAGNOSIS — I69354 Hemiplegia and hemiparesis following cerebral infarction affecting left non-dominant side: Secondary | ICD-10-CM | POA: Diagnosis not present

## 2022-08-21 DIAGNOSIS — I10 Essential (primary) hypertension: Secondary | ICD-10-CM | POA: Diagnosis not present

## 2022-08-24 DIAGNOSIS — I10 Essential (primary) hypertension: Secondary | ICD-10-CM | POA: Diagnosis not present

## 2022-08-24 DIAGNOSIS — G309 Alzheimer's disease, unspecified: Secondary | ICD-10-CM | POA: Diagnosis not present

## 2022-08-24 DIAGNOSIS — E43 Unspecified severe protein-calorie malnutrition: Secondary | ICD-10-CM | POA: Diagnosis not present

## 2022-08-24 DIAGNOSIS — F02811 Dementia in other diseases classified elsewhere, unspecified severity, with agitation: Secondary | ICD-10-CM | POA: Diagnosis not present

## 2022-08-24 DIAGNOSIS — F0282 Dementia in other diseases classified elsewhere, unspecified severity, with psychotic disturbance: Secondary | ICD-10-CM | POA: Diagnosis not present

## 2022-08-24 DIAGNOSIS — I69354 Hemiplegia and hemiparesis following cerebral infarction affecting left non-dominant side: Secondary | ICD-10-CM | POA: Diagnosis not present

## 2022-08-28 DIAGNOSIS — I69354 Hemiplegia and hemiparesis following cerebral infarction affecting left non-dominant side: Secondary | ICD-10-CM | POA: Diagnosis not present

## 2022-08-28 DIAGNOSIS — F0282 Dementia in other diseases classified elsewhere, unspecified severity, with psychotic disturbance: Secondary | ICD-10-CM | POA: Diagnosis not present

## 2022-08-28 DIAGNOSIS — F02811 Dementia in other diseases classified elsewhere, unspecified severity, with agitation: Secondary | ICD-10-CM | POA: Diagnosis not present

## 2022-08-28 DIAGNOSIS — G309 Alzheimer's disease, unspecified: Secondary | ICD-10-CM | POA: Diagnosis not present

## 2022-08-28 DIAGNOSIS — I10 Essential (primary) hypertension: Secondary | ICD-10-CM | POA: Diagnosis not present

## 2022-08-28 DIAGNOSIS — E43 Unspecified severe protein-calorie malnutrition: Secondary | ICD-10-CM | POA: Diagnosis not present

## 2022-08-31 DIAGNOSIS — I10 Essential (primary) hypertension: Secondary | ICD-10-CM | POA: Diagnosis not present

## 2022-08-31 DIAGNOSIS — G309 Alzheimer's disease, unspecified: Secondary | ICD-10-CM | POA: Diagnosis not present

## 2022-08-31 DIAGNOSIS — I69354 Hemiplegia and hemiparesis following cerebral infarction affecting left non-dominant side: Secondary | ICD-10-CM | POA: Diagnosis not present

## 2022-08-31 DIAGNOSIS — F02811 Dementia in other diseases classified elsewhere, unspecified severity, with agitation: Secondary | ICD-10-CM | POA: Diagnosis not present

## 2022-08-31 DIAGNOSIS — E43 Unspecified severe protein-calorie malnutrition: Secondary | ICD-10-CM | POA: Diagnosis not present

## 2022-08-31 DIAGNOSIS — F0282 Dementia in other diseases classified elsewhere, unspecified severity, with psychotic disturbance: Secondary | ICD-10-CM | POA: Diagnosis not present

## 2022-09-04 DIAGNOSIS — I69354 Hemiplegia and hemiparesis following cerebral infarction affecting left non-dominant side: Secondary | ICD-10-CM | POA: Diagnosis not present

## 2022-09-04 DIAGNOSIS — E43 Unspecified severe protein-calorie malnutrition: Secondary | ICD-10-CM | POA: Diagnosis not present

## 2022-09-04 DIAGNOSIS — I10 Essential (primary) hypertension: Secondary | ICD-10-CM | POA: Diagnosis not present

## 2022-09-04 DIAGNOSIS — F02811 Dementia in other diseases classified elsewhere, unspecified severity, with agitation: Secondary | ICD-10-CM | POA: Diagnosis not present

## 2022-09-04 DIAGNOSIS — G309 Alzheimer's disease, unspecified: Secondary | ICD-10-CM | POA: Diagnosis not present

## 2022-09-04 DIAGNOSIS — F0282 Dementia in other diseases classified elsewhere, unspecified severity, with psychotic disturbance: Secondary | ICD-10-CM | POA: Diagnosis not present

## 2022-09-07 DIAGNOSIS — R64 Cachexia: Secondary | ICD-10-CM | POA: Diagnosis not present

## 2022-09-07 DIAGNOSIS — Z681 Body mass index (BMI) 19 or less, adult: Secondary | ICD-10-CM | POA: Diagnosis not present

## 2022-09-07 DIAGNOSIS — Z741 Need for assistance with personal care: Secondary | ICD-10-CM | POA: Diagnosis not present

## 2022-09-07 DIAGNOSIS — Z7401 Bed confinement status: Secondary | ICD-10-CM | POA: Diagnosis not present

## 2022-09-07 DIAGNOSIS — Z853 Personal history of malignant neoplasm of breast: Secondary | ICD-10-CM | POA: Diagnosis not present

## 2022-09-07 DIAGNOSIS — R634 Abnormal weight loss: Secondary | ICD-10-CM | POA: Diagnosis not present

## 2022-09-07 DIAGNOSIS — Z9981 Dependence on supplemental oxygen: Secondary | ICD-10-CM | POA: Diagnosis not present

## 2022-09-07 DIAGNOSIS — F0282 Dementia in other diseases classified elsewhere, unspecified severity, with psychotic disturbance: Secondary | ICD-10-CM | POA: Diagnosis not present

## 2022-09-07 DIAGNOSIS — R159 Full incontinence of feces: Secondary | ICD-10-CM | POA: Diagnosis not present

## 2022-09-07 DIAGNOSIS — E43 Unspecified severe protein-calorie malnutrition: Secondary | ICD-10-CM | POA: Diagnosis not present

## 2022-09-07 DIAGNOSIS — I69354 Hemiplegia and hemiparesis following cerebral infarction affecting left non-dominant side: Secondary | ICD-10-CM | POA: Diagnosis not present

## 2022-09-07 DIAGNOSIS — R32 Unspecified urinary incontinence: Secondary | ICD-10-CM | POA: Diagnosis not present

## 2022-09-07 DIAGNOSIS — L89152 Pressure ulcer of sacral region, stage 2: Secondary | ICD-10-CM | POA: Diagnosis not present

## 2022-09-07 DIAGNOSIS — G309 Alzheimer's disease, unspecified: Secondary | ICD-10-CM | POA: Diagnosis not present

## 2022-09-07 DIAGNOSIS — I10 Essential (primary) hypertension: Secondary | ICD-10-CM | POA: Diagnosis not present

## 2022-09-07 DIAGNOSIS — F02811 Dementia in other diseases classified elsewhere, unspecified severity, with agitation: Secondary | ICD-10-CM | POA: Diagnosis not present

## 2022-09-11 DIAGNOSIS — E43 Unspecified severe protein-calorie malnutrition: Secondary | ICD-10-CM | POA: Diagnosis not present

## 2022-09-11 DIAGNOSIS — I10 Essential (primary) hypertension: Secondary | ICD-10-CM | POA: Diagnosis not present

## 2022-09-11 DIAGNOSIS — G309 Alzheimer's disease, unspecified: Secondary | ICD-10-CM | POA: Diagnosis not present

## 2022-09-11 DIAGNOSIS — F0282 Dementia in other diseases classified elsewhere, unspecified severity, with psychotic disturbance: Secondary | ICD-10-CM | POA: Diagnosis not present

## 2022-09-11 DIAGNOSIS — I69354 Hemiplegia and hemiparesis following cerebral infarction affecting left non-dominant side: Secondary | ICD-10-CM | POA: Diagnosis not present

## 2022-09-11 DIAGNOSIS — F02811 Dementia in other diseases classified elsewhere, unspecified severity, with agitation: Secondary | ICD-10-CM | POA: Diagnosis not present

## 2022-09-14 DIAGNOSIS — I69354 Hemiplegia and hemiparesis following cerebral infarction affecting left non-dominant side: Secondary | ICD-10-CM | POA: Diagnosis not present

## 2022-09-14 DIAGNOSIS — F02811 Dementia in other diseases classified elsewhere, unspecified severity, with agitation: Secondary | ICD-10-CM | POA: Diagnosis not present

## 2022-09-14 DIAGNOSIS — I10 Essential (primary) hypertension: Secondary | ICD-10-CM | POA: Diagnosis not present

## 2022-09-14 DIAGNOSIS — E43 Unspecified severe protein-calorie malnutrition: Secondary | ICD-10-CM | POA: Diagnosis not present

## 2022-09-14 DIAGNOSIS — F0282 Dementia in other diseases classified elsewhere, unspecified severity, with psychotic disturbance: Secondary | ICD-10-CM | POA: Diagnosis not present

## 2022-09-14 DIAGNOSIS — G309 Alzheimer's disease, unspecified: Secondary | ICD-10-CM | POA: Diagnosis not present

## 2022-09-18 DIAGNOSIS — E43 Unspecified severe protein-calorie malnutrition: Secondary | ICD-10-CM | POA: Diagnosis not present

## 2022-09-18 DIAGNOSIS — F0282 Dementia in other diseases classified elsewhere, unspecified severity, with psychotic disturbance: Secondary | ICD-10-CM | POA: Diagnosis not present

## 2022-09-18 DIAGNOSIS — I10 Essential (primary) hypertension: Secondary | ICD-10-CM | POA: Diagnosis not present

## 2022-09-18 DIAGNOSIS — G309 Alzheimer's disease, unspecified: Secondary | ICD-10-CM | POA: Diagnosis not present

## 2022-09-18 DIAGNOSIS — F02811 Dementia in other diseases classified elsewhere, unspecified severity, with agitation: Secondary | ICD-10-CM | POA: Diagnosis not present

## 2022-09-18 DIAGNOSIS — I69354 Hemiplegia and hemiparesis following cerebral infarction affecting left non-dominant side: Secondary | ICD-10-CM | POA: Diagnosis not present

## 2022-09-21 DIAGNOSIS — I69354 Hemiplegia and hemiparesis following cerebral infarction affecting left non-dominant side: Secondary | ICD-10-CM | POA: Diagnosis not present

## 2022-09-21 DIAGNOSIS — F02811 Dementia in other diseases classified elsewhere, unspecified severity, with agitation: Secondary | ICD-10-CM | POA: Diagnosis not present

## 2022-09-21 DIAGNOSIS — F0282 Dementia in other diseases classified elsewhere, unspecified severity, with psychotic disturbance: Secondary | ICD-10-CM | POA: Diagnosis not present

## 2022-09-21 DIAGNOSIS — I10 Essential (primary) hypertension: Secondary | ICD-10-CM | POA: Diagnosis not present

## 2022-09-21 DIAGNOSIS — E43 Unspecified severe protein-calorie malnutrition: Secondary | ICD-10-CM | POA: Diagnosis not present

## 2022-09-21 DIAGNOSIS — G309 Alzheimer's disease, unspecified: Secondary | ICD-10-CM | POA: Diagnosis not present

## 2022-09-25 DIAGNOSIS — I10 Essential (primary) hypertension: Secondary | ICD-10-CM | POA: Diagnosis not present

## 2022-09-25 DIAGNOSIS — G309 Alzheimer's disease, unspecified: Secondary | ICD-10-CM | POA: Diagnosis not present

## 2022-09-25 DIAGNOSIS — I69354 Hemiplegia and hemiparesis following cerebral infarction affecting left non-dominant side: Secondary | ICD-10-CM | POA: Diagnosis not present

## 2022-09-25 DIAGNOSIS — F02811 Dementia in other diseases classified elsewhere, unspecified severity, with agitation: Secondary | ICD-10-CM | POA: Diagnosis not present

## 2022-09-25 DIAGNOSIS — E43 Unspecified severe protein-calorie malnutrition: Secondary | ICD-10-CM | POA: Diagnosis not present

## 2022-09-25 DIAGNOSIS — F0282 Dementia in other diseases classified elsewhere, unspecified severity, with psychotic disturbance: Secondary | ICD-10-CM | POA: Diagnosis not present

## 2022-09-28 DIAGNOSIS — I69354 Hemiplegia and hemiparesis following cerebral infarction affecting left non-dominant side: Secondary | ICD-10-CM | POA: Diagnosis not present

## 2022-09-28 DIAGNOSIS — E43 Unspecified severe protein-calorie malnutrition: Secondary | ICD-10-CM | POA: Diagnosis not present

## 2022-09-28 DIAGNOSIS — F02811 Dementia in other diseases classified elsewhere, unspecified severity, with agitation: Secondary | ICD-10-CM | POA: Diagnosis not present

## 2022-09-28 DIAGNOSIS — I10 Essential (primary) hypertension: Secondary | ICD-10-CM | POA: Diagnosis not present

## 2022-09-28 DIAGNOSIS — F0282 Dementia in other diseases classified elsewhere, unspecified severity, with psychotic disturbance: Secondary | ICD-10-CM | POA: Diagnosis not present

## 2022-09-28 DIAGNOSIS — G309 Alzheimer's disease, unspecified: Secondary | ICD-10-CM | POA: Diagnosis not present

## 2022-10-02 DIAGNOSIS — F0282 Dementia in other diseases classified elsewhere, unspecified severity, with psychotic disturbance: Secondary | ICD-10-CM | POA: Diagnosis not present

## 2022-10-02 DIAGNOSIS — E43 Unspecified severe protein-calorie malnutrition: Secondary | ICD-10-CM | POA: Diagnosis not present

## 2022-10-02 DIAGNOSIS — G309 Alzheimer's disease, unspecified: Secondary | ICD-10-CM | POA: Diagnosis not present

## 2022-10-02 DIAGNOSIS — I69354 Hemiplegia and hemiparesis following cerebral infarction affecting left non-dominant side: Secondary | ICD-10-CM | POA: Diagnosis not present

## 2022-10-02 DIAGNOSIS — F02811 Dementia in other diseases classified elsewhere, unspecified severity, with agitation: Secondary | ICD-10-CM | POA: Diagnosis not present

## 2022-10-02 DIAGNOSIS — I10 Essential (primary) hypertension: Secondary | ICD-10-CM | POA: Diagnosis not present

## 2022-10-05 DIAGNOSIS — G309 Alzheimer's disease, unspecified: Secondary | ICD-10-CM | POA: Diagnosis not present

## 2022-10-05 DIAGNOSIS — I69354 Hemiplegia and hemiparesis following cerebral infarction affecting left non-dominant side: Secondary | ICD-10-CM | POA: Diagnosis not present

## 2022-10-05 DIAGNOSIS — F02811 Dementia in other diseases classified elsewhere, unspecified severity, with agitation: Secondary | ICD-10-CM | POA: Diagnosis not present

## 2022-10-05 DIAGNOSIS — F0282 Dementia in other diseases classified elsewhere, unspecified severity, with psychotic disturbance: Secondary | ICD-10-CM | POA: Diagnosis not present

## 2022-10-05 DIAGNOSIS — E43 Unspecified severe protein-calorie malnutrition: Secondary | ICD-10-CM | POA: Diagnosis not present

## 2022-10-05 DIAGNOSIS — I10 Essential (primary) hypertension: Secondary | ICD-10-CM | POA: Diagnosis not present

## 2022-10-07 DIAGNOSIS — Z7401 Bed confinement status: Secondary | ICD-10-CM | POA: Diagnosis not present

## 2022-10-07 DIAGNOSIS — Z853 Personal history of malignant neoplasm of breast: Secondary | ICD-10-CM | POA: Diagnosis not present

## 2022-10-07 DIAGNOSIS — R634 Abnormal weight loss: Secondary | ICD-10-CM | POA: Diagnosis not present

## 2022-10-07 DIAGNOSIS — E43 Unspecified severe protein-calorie malnutrition: Secondary | ICD-10-CM | POA: Diagnosis not present

## 2022-10-07 DIAGNOSIS — R32 Unspecified urinary incontinence: Secondary | ICD-10-CM | POA: Diagnosis not present

## 2022-10-07 DIAGNOSIS — R64 Cachexia: Secondary | ICD-10-CM | POA: Diagnosis not present

## 2022-10-07 DIAGNOSIS — G309 Alzheimer's disease, unspecified: Secondary | ICD-10-CM | POA: Diagnosis not present

## 2022-10-07 DIAGNOSIS — Z9981 Dependence on supplemental oxygen: Secondary | ICD-10-CM | POA: Diagnosis not present

## 2022-10-07 DIAGNOSIS — I69354 Hemiplegia and hemiparesis following cerebral infarction affecting left non-dominant side: Secondary | ICD-10-CM | POA: Diagnosis not present

## 2022-10-07 DIAGNOSIS — Z741 Need for assistance with personal care: Secondary | ICD-10-CM | POA: Diagnosis not present

## 2022-10-07 DIAGNOSIS — F0282 Dementia in other diseases classified elsewhere, unspecified severity, with psychotic disturbance: Secondary | ICD-10-CM | POA: Diagnosis not present

## 2022-10-07 DIAGNOSIS — R159 Full incontinence of feces: Secondary | ICD-10-CM | POA: Diagnosis not present

## 2022-10-07 DIAGNOSIS — F02811 Dementia in other diseases classified elsewhere, unspecified severity, with agitation: Secondary | ICD-10-CM | POA: Diagnosis not present

## 2022-10-07 DIAGNOSIS — Z681 Body mass index (BMI) 19 or less, adult: Secondary | ICD-10-CM | POA: Diagnosis not present

## 2022-10-07 DIAGNOSIS — L89152 Pressure ulcer of sacral region, stage 2: Secondary | ICD-10-CM | POA: Diagnosis not present

## 2022-10-07 DIAGNOSIS — I10 Essential (primary) hypertension: Secondary | ICD-10-CM | POA: Diagnosis not present

## 2022-10-09 DIAGNOSIS — F0282 Dementia in other diseases classified elsewhere, unspecified severity, with psychotic disturbance: Secondary | ICD-10-CM | POA: Diagnosis not present

## 2022-10-09 DIAGNOSIS — I69354 Hemiplegia and hemiparesis following cerebral infarction affecting left non-dominant side: Secondary | ICD-10-CM | POA: Diagnosis not present

## 2022-10-09 DIAGNOSIS — F02811 Dementia in other diseases classified elsewhere, unspecified severity, with agitation: Secondary | ICD-10-CM | POA: Diagnosis not present

## 2022-10-09 DIAGNOSIS — G309 Alzheimer's disease, unspecified: Secondary | ICD-10-CM | POA: Diagnosis not present

## 2022-10-09 DIAGNOSIS — I10 Essential (primary) hypertension: Secondary | ICD-10-CM | POA: Diagnosis not present

## 2022-10-09 DIAGNOSIS — E43 Unspecified severe protein-calorie malnutrition: Secondary | ICD-10-CM | POA: Diagnosis not present

## 2022-10-12 DIAGNOSIS — G309 Alzheimer's disease, unspecified: Secondary | ICD-10-CM | POA: Diagnosis not present

## 2022-10-12 DIAGNOSIS — I69354 Hemiplegia and hemiparesis following cerebral infarction affecting left non-dominant side: Secondary | ICD-10-CM | POA: Diagnosis not present

## 2022-10-12 DIAGNOSIS — E43 Unspecified severe protein-calorie malnutrition: Secondary | ICD-10-CM | POA: Diagnosis not present

## 2022-10-12 DIAGNOSIS — I10 Essential (primary) hypertension: Secondary | ICD-10-CM | POA: Diagnosis not present

## 2022-10-12 DIAGNOSIS — F02811 Dementia in other diseases classified elsewhere, unspecified severity, with agitation: Secondary | ICD-10-CM | POA: Diagnosis not present

## 2022-10-12 DIAGNOSIS — F0282 Dementia in other diseases classified elsewhere, unspecified severity, with psychotic disturbance: Secondary | ICD-10-CM | POA: Diagnosis not present

## 2022-10-16 DIAGNOSIS — G309 Alzheimer's disease, unspecified: Secondary | ICD-10-CM | POA: Diagnosis not present

## 2022-10-16 DIAGNOSIS — E43 Unspecified severe protein-calorie malnutrition: Secondary | ICD-10-CM | POA: Diagnosis not present

## 2022-10-16 DIAGNOSIS — I10 Essential (primary) hypertension: Secondary | ICD-10-CM | POA: Diagnosis not present

## 2022-10-16 DIAGNOSIS — F0282 Dementia in other diseases classified elsewhere, unspecified severity, with psychotic disturbance: Secondary | ICD-10-CM | POA: Diagnosis not present

## 2022-10-16 DIAGNOSIS — F02811 Dementia in other diseases classified elsewhere, unspecified severity, with agitation: Secondary | ICD-10-CM | POA: Diagnosis not present

## 2022-10-16 DIAGNOSIS — I69354 Hemiplegia and hemiparesis following cerebral infarction affecting left non-dominant side: Secondary | ICD-10-CM | POA: Diagnosis not present

## 2022-10-19 DIAGNOSIS — I69354 Hemiplegia and hemiparesis following cerebral infarction affecting left non-dominant side: Secondary | ICD-10-CM | POA: Diagnosis not present

## 2022-10-19 DIAGNOSIS — G309 Alzheimer's disease, unspecified: Secondary | ICD-10-CM | POA: Diagnosis not present

## 2022-10-19 DIAGNOSIS — F02811 Dementia in other diseases classified elsewhere, unspecified severity, with agitation: Secondary | ICD-10-CM | POA: Diagnosis not present

## 2022-10-19 DIAGNOSIS — E43 Unspecified severe protein-calorie malnutrition: Secondary | ICD-10-CM | POA: Diagnosis not present

## 2022-10-19 DIAGNOSIS — F0282 Dementia in other diseases classified elsewhere, unspecified severity, with psychotic disturbance: Secondary | ICD-10-CM | POA: Diagnosis not present

## 2022-10-19 DIAGNOSIS — I10 Essential (primary) hypertension: Secondary | ICD-10-CM | POA: Diagnosis not present

## 2022-10-23 DIAGNOSIS — I69354 Hemiplegia and hemiparesis following cerebral infarction affecting left non-dominant side: Secondary | ICD-10-CM | POA: Diagnosis not present

## 2022-10-23 DIAGNOSIS — I10 Essential (primary) hypertension: Secondary | ICD-10-CM | POA: Diagnosis not present

## 2022-10-23 DIAGNOSIS — G309 Alzheimer's disease, unspecified: Secondary | ICD-10-CM | POA: Diagnosis not present

## 2022-10-23 DIAGNOSIS — F02811 Dementia in other diseases classified elsewhere, unspecified severity, with agitation: Secondary | ICD-10-CM | POA: Diagnosis not present

## 2022-10-23 DIAGNOSIS — E43 Unspecified severe protein-calorie malnutrition: Secondary | ICD-10-CM | POA: Diagnosis not present

## 2022-10-23 DIAGNOSIS — F0282 Dementia in other diseases classified elsewhere, unspecified severity, with psychotic disturbance: Secondary | ICD-10-CM | POA: Diagnosis not present

## 2022-10-26 DIAGNOSIS — E43 Unspecified severe protein-calorie malnutrition: Secondary | ICD-10-CM | POA: Diagnosis not present

## 2022-10-26 DIAGNOSIS — F02811 Dementia in other diseases classified elsewhere, unspecified severity, with agitation: Secondary | ICD-10-CM | POA: Diagnosis not present

## 2022-10-26 DIAGNOSIS — I69354 Hemiplegia and hemiparesis following cerebral infarction affecting left non-dominant side: Secondary | ICD-10-CM | POA: Diagnosis not present

## 2022-10-26 DIAGNOSIS — G309 Alzheimer's disease, unspecified: Secondary | ICD-10-CM | POA: Diagnosis not present

## 2022-10-26 DIAGNOSIS — F0282 Dementia in other diseases classified elsewhere, unspecified severity, with psychotic disturbance: Secondary | ICD-10-CM | POA: Diagnosis not present

## 2022-10-26 DIAGNOSIS — I10 Essential (primary) hypertension: Secondary | ICD-10-CM | POA: Diagnosis not present

## 2022-10-30 DIAGNOSIS — I10 Essential (primary) hypertension: Secondary | ICD-10-CM | POA: Diagnosis not present

## 2022-10-30 DIAGNOSIS — G309 Alzheimer's disease, unspecified: Secondary | ICD-10-CM | POA: Diagnosis not present

## 2022-10-30 DIAGNOSIS — E43 Unspecified severe protein-calorie malnutrition: Secondary | ICD-10-CM | POA: Diagnosis not present

## 2022-10-30 DIAGNOSIS — F0282 Dementia in other diseases classified elsewhere, unspecified severity, with psychotic disturbance: Secondary | ICD-10-CM | POA: Diagnosis not present

## 2022-10-30 DIAGNOSIS — F02811 Dementia in other diseases classified elsewhere, unspecified severity, with agitation: Secondary | ICD-10-CM | POA: Diagnosis not present

## 2022-10-30 DIAGNOSIS — I69354 Hemiplegia and hemiparesis following cerebral infarction affecting left non-dominant side: Secondary | ICD-10-CM | POA: Diagnosis not present

## 2022-11-03 DIAGNOSIS — G309 Alzheimer's disease, unspecified: Secondary | ICD-10-CM | POA: Diagnosis not present

## 2022-11-03 DIAGNOSIS — E43 Unspecified severe protein-calorie malnutrition: Secondary | ICD-10-CM | POA: Diagnosis not present

## 2022-11-03 DIAGNOSIS — F0282 Dementia in other diseases classified elsewhere, unspecified severity, with psychotic disturbance: Secondary | ICD-10-CM | POA: Diagnosis not present

## 2022-11-03 DIAGNOSIS — F02811 Dementia in other diseases classified elsewhere, unspecified severity, with agitation: Secondary | ICD-10-CM | POA: Diagnosis not present

## 2022-11-03 DIAGNOSIS — I10 Essential (primary) hypertension: Secondary | ICD-10-CM | POA: Diagnosis not present

## 2022-11-03 DIAGNOSIS — I69354 Hemiplegia and hemiparesis following cerebral infarction affecting left non-dominant side: Secondary | ICD-10-CM | POA: Diagnosis not present

## 2022-11-06 DIAGNOSIS — I10 Essential (primary) hypertension: Secondary | ICD-10-CM | POA: Diagnosis not present

## 2022-11-06 DIAGNOSIS — I69354 Hemiplegia and hemiparesis following cerebral infarction affecting left non-dominant side: Secondary | ICD-10-CM | POA: Diagnosis not present

## 2022-11-06 DIAGNOSIS — F0282 Dementia in other diseases classified elsewhere, unspecified severity, with psychotic disturbance: Secondary | ICD-10-CM | POA: Diagnosis not present

## 2022-11-06 DIAGNOSIS — F02811 Dementia in other diseases classified elsewhere, unspecified severity, with agitation: Secondary | ICD-10-CM | POA: Diagnosis not present

## 2022-11-06 DIAGNOSIS — E43 Unspecified severe protein-calorie malnutrition: Secondary | ICD-10-CM | POA: Diagnosis not present

## 2022-11-06 DIAGNOSIS — G309 Alzheimer's disease, unspecified: Secondary | ICD-10-CM | POA: Diagnosis not present

## 2022-11-07 DIAGNOSIS — Z9981 Dependence on supplemental oxygen: Secondary | ICD-10-CM | POA: Diagnosis not present

## 2022-11-07 DIAGNOSIS — R64 Cachexia: Secondary | ICD-10-CM | POA: Diagnosis not present

## 2022-11-07 DIAGNOSIS — Z7401 Bed confinement status: Secondary | ICD-10-CM | POA: Diagnosis not present

## 2022-11-07 DIAGNOSIS — I10 Essential (primary) hypertension: Secondary | ICD-10-CM | POA: Diagnosis not present

## 2022-11-07 DIAGNOSIS — G309 Alzheimer's disease, unspecified: Secondary | ICD-10-CM | POA: Diagnosis not present

## 2022-11-07 DIAGNOSIS — I69354 Hemiplegia and hemiparesis following cerebral infarction affecting left non-dominant side: Secondary | ICD-10-CM | POA: Diagnosis not present

## 2022-11-07 DIAGNOSIS — F0282 Dementia in other diseases classified elsewhere, unspecified severity, with psychotic disturbance: Secondary | ICD-10-CM | POA: Diagnosis not present

## 2022-11-07 DIAGNOSIS — Z741 Need for assistance with personal care: Secondary | ICD-10-CM | POA: Diagnosis not present

## 2022-11-07 DIAGNOSIS — Z853 Personal history of malignant neoplasm of breast: Secondary | ICD-10-CM | POA: Diagnosis not present

## 2022-11-07 DIAGNOSIS — R32 Unspecified urinary incontinence: Secondary | ICD-10-CM | POA: Diagnosis not present

## 2022-11-07 DIAGNOSIS — R159 Full incontinence of feces: Secondary | ICD-10-CM | POA: Diagnosis not present

## 2022-11-07 DIAGNOSIS — L89152 Pressure ulcer of sacral region, stage 2: Secondary | ICD-10-CM | POA: Diagnosis not present

## 2022-11-07 DIAGNOSIS — E43 Unspecified severe protein-calorie malnutrition: Secondary | ICD-10-CM | POA: Diagnosis not present

## 2022-11-07 DIAGNOSIS — R634 Abnormal weight loss: Secondary | ICD-10-CM | POA: Diagnosis not present

## 2022-11-07 DIAGNOSIS — Z681 Body mass index (BMI) 19 or less, adult: Secondary | ICD-10-CM | POA: Diagnosis not present

## 2022-11-07 DIAGNOSIS — F02811 Dementia in other diseases classified elsewhere, unspecified severity, with agitation: Secondary | ICD-10-CM | POA: Diagnosis not present

## 2022-11-09 DIAGNOSIS — E43 Unspecified severe protein-calorie malnutrition: Secondary | ICD-10-CM | POA: Diagnosis not present

## 2022-11-09 DIAGNOSIS — F02811 Dementia in other diseases classified elsewhere, unspecified severity, with agitation: Secondary | ICD-10-CM | POA: Diagnosis not present

## 2022-11-09 DIAGNOSIS — I10 Essential (primary) hypertension: Secondary | ICD-10-CM | POA: Diagnosis not present

## 2022-11-09 DIAGNOSIS — F0282 Dementia in other diseases classified elsewhere, unspecified severity, with psychotic disturbance: Secondary | ICD-10-CM | POA: Diagnosis not present

## 2022-11-09 DIAGNOSIS — G309 Alzheimer's disease, unspecified: Secondary | ICD-10-CM | POA: Diagnosis not present

## 2022-11-09 DIAGNOSIS — I69354 Hemiplegia and hemiparesis following cerebral infarction affecting left non-dominant side: Secondary | ICD-10-CM | POA: Diagnosis not present

## 2022-11-13 DIAGNOSIS — G309 Alzheimer's disease, unspecified: Secondary | ICD-10-CM | POA: Diagnosis not present

## 2022-11-13 DIAGNOSIS — I69354 Hemiplegia and hemiparesis following cerebral infarction affecting left non-dominant side: Secondary | ICD-10-CM | POA: Diagnosis not present

## 2022-11-13 DIAGNOSIS — I10 Essential (primary) hypertension: Secondary | ICD-10-CM | POA: Diagnosis not present

## 2022-11-13 DIAGNOSIS — F02811 Dementia in other diseases classified elsewhere, unspecified severity, with agitation: Secondary | ICD-10-CM | POA: Diagnosis not present

## 2022-11-13 DIAGNOSIS — E43 Unspecified severe protein-calorie malnutrition: Secondary | ICD-10-CM | POA: Diagnosis not present

## 2022-11-13 DIAGNOSIS — F0282 Dementia in other diseases classified elsewhere, unspecified severity, with psychotic disturbance: Secondary | ICD-10-CM | POA: Diagnosis not present

## 2022-11-16 DIAGNOSIS — I10 Essential (primary) hypertension: Secondary | ICD-10-CM | POA: Diagnosis not present

## 2022-11-16 DIAGNOSIS — I69354 Hemiplegia and hemiparesis following cerebral infarction affecting left non-dominant side: Secondary | ICD-10-CM | POA: Diagnosis not present

## 2022-11-16 DIAGNOSIS — E43 Unspecified severe protein-calorie malnutrition: Secondary | ICD-10-CM | POA: Diagnosis not present

## 2022-11-16 DIAGNOSIS — G309 Alzheimer's disease, unspecified: Secondary | ICD-10-CM | POA: Diagnosis not present

## 2022-11-16 DIAGNOSIS — F0282 Dementia in other diseases classified elsewhere, unspecified severity, with psychotic disturbance: Secondary | ICD-10-CM | POA: Diagnosis not present

## 2022-11-16 DIAGNOSIS — F02811 Dementia in other diseases classified elsewhere, unspecified severity, with agitation: Secondary | ICD-10-CM | POA: Diagnosis not present

## 2022-11-20 DIAGNOSIS — G309 Alzheimer's disease, unspecified: Secondary | ICD-10-CM | POA: Diagnosis not present

## 2022-11-20 DIAGNOSIS — F02811 Dementia in other diseases classified elsewhere, unspecified severity, with agitation: Secondary | ICD-10-CM | POA: Diagnosis not present

## 2022-11-20 DIAGNOSIS — I10 Essential (primary) hypertension: Secondary | ICD-10-CM | POA: Diagnosis not present

## 2022-11-20 DIAGNOSIS — E43 Unspecified severe protein-calorie malnutrition: Secondary | ICD-10-CM | POA: Diagnosis not present

## 2022-11-20 DIAGNOSIS — I69354 Hemiplegia and hemiparesis following cerebral infarction affecting left non-dominant side: Secondary | ICD-10-CM | POA: Diagnosis not present

## 2022-11-20 DIAGNOSIS — F0282 Dementia in other diseases classified elsewhere, unspecified severity, with psychotic disturbance: Secondary | ICD-10-CM | POA: Diagnosis not present

## 2022-11-23 DIAGNOSIS — F0282 Dementia in other diseases classified elsewhere, unspecified severity, with psychotic disturbance: Secondary | ICD-10-CM | POA: Diagnosis not present

## 2022-11-23 DIAGNOSIS — E43 Unspecified severe protein-calorie malnutrition: Secondary | ICD-10-CM | POA: Diagnosis not present

## 2022-11-23 DIAGNOSIS — G309 Alzheimer's disease, unspecified: Secondary | ICD-10-CM | POA: Diagnosis not present

## 2022-11-23 DIAGNOSIS — I69354 Hemiplegia and hemiparesis following cerebral infarction affecting left non-dominant side: Secondary | ICD-10-CM | POA: Diagnosis not present

## 2022-11-23 DIAGNOSIS — F02811 Dementia in other diseases classified elsewhere, unspecified severity, with agitation: Secondary | ICD-10-CM | POA: Diagnosis not present

## 2022-11-23 DIAGNOSIS — I10 Essential (primary) hypertension: Secondary | ICD-10-CM | POA: Diagnosis not present

## 2022-11-27 DIAGNOSIS — F0282 Dementia in other diseases classified elsewhere, unspecified severity, with psychotic disturbance: Secondary | ICD-10-CM | POA: Diagnosis not present

## 2022-11-27 DIAGNOSIS — I69354 Hemiplegia and hemiparesis following cerebral infarction affecting left non-dominant side: Secondary | ICD-10-CM | POA: Diagnosis not present

## 2022-11-27 DIAGNOSIS — G309 Alzheimer's disease, unspecified: Secondary | ICD-10-CM | POA: Diagnosis not present

## 2022-11-27 DIAGNOSIS — F02811 Dementia in other diseases classified elsewhere, unspecified severity, with agitation: Secondary | ICD-10-CM | POA: Diagnosis not present

## 2022-11-27 DIAGNOSIS — E43 Unspecified severe protein-calorie malnutrition: Secondary | ICD-10-CM | POA: Diagnosis not present

## 2022-11-27 DIAGNOSIS — I10 Essential (primary) hypertension: Secondary | ICD-10-CM | POA: Diagnosis not present

## 2022-11-30 DIAGNOSIS — I10 Essential (primary) hypertension: Secondary | ICD-10-CM | POA: Diagnosis not present

## 2022-11-30 DIAGNOSIS — F02811 Dementia in other diseases classified elsewhere, unspecified severity, with agitation: Secondary | ICD-10-CM | POA: Diagnosis not present

## 2022-11-30 DIAGNOSIS — I69354 Hemiplegia and hemiparesis following cerebral infarction affecting left non-dominant side: Secondary | ICD-10-CM | POA: Diagnosis not present

## 2022-11-30 DIAGNOSIS — E43 Unspecified severe protein-calorie malnutrition: Secondary | ICD-10-CM | POA: Diagnosis not present

## 2022-11-30 DIAGNOSIS — F0282 Dementia in other diseases classified elsewhere, unspecified severity, with psychotic disturbance: Secondary | ICD-10-CM | POA: Diagnosis not present

## 2022-11-30 DIAGNOSIS — G309 Alzheimer's disease, unspecified: Secondary | ICD-10-CM | POA: Diagnosis not present

## 2022-12-04 DIAGNOSIS — I10 Essential (primary) hypertension: Secondary | ICD-10-CM | POA: Diagnosis not present

## 2022-12-04 DIAGNOSIS — F0282 Dementia in other diseases classified elsewhere, unspecified severity, with psychotic disturbance: Secondary | ICD-10-CM | POA: Diagnosis not present

## 2022-12-04 DIAGNOSIS — G309 Alzheimer's disease, unspecified: Secondary | ICD-10-CM | POA: Diagnosis not present

## 2022-12-04 DIAGNOSIS — I69354 Hemiplegia and hemiparesis following cerebral infarction affecting left non-dominant side: Secondary | ICD-10-CM | POA: Diagnosis not present

## 2022-12-04 DIAGNOSIS — E43 Unspecified severe protein-calorie malnutrition: Secondary | ICD-10-CM | POA: Diagnosis not present

## 2022-12-04 DIAGNOSIS — F02811 Dementia in other diseases classified elsewhere, unspecified severity, with agitation: Secondary | ICD-10-CM | POA: Diagnosis not present

## 2022-12-07 DIAGNOSIS — Z741 Need for assistance with personal care: Secondary | ICD-10-CM | POA: Diagnosis not present

## 2022-12-07 DIAGNOSIS — F0282 Dementia in other diseases classified elsewhere, unspecified severity, with psychotic disturbance: Secondary | ICD-10-CM | POA: Diagnosis not present

## 2022-12-07 DIAGNOSIS — R634 Abnormal weight loss: Secondary | ICD-10-CM | POA: Diagnosis not present

## 2022-12-07 DIAGNOSIS — Z9981 Dependence on supplemental oxygen: Secondary | ICD-10-CM | POA: Diagnosis not present

## 2022-12-07 DIAGNOSIS — E43 Unspecified severe protein-calorie malnutrition: Secondary | ICD-10-CM | POA: Diagnosis not present

## 2022-12-07 DIAGNOSIS — Z681 Body mass index (BMI) 19 or less, adult: Secondary | ICD-10-CM | POA: Diagnosis not present

## 2022-12-07 DIAGNOSIS — L89152 Pressure ulcer of sacral region, stage 2: Secondary | ICD-10-CM | POA: Diagnosis not present

## 2022-12-07 DIAGNOSIS — Z853 Personal history of malignant neoplasm of breast: Secondary | ICD-10-CM | POA: Diagnosis not present

## 2022-12-07 DIAGNOSIS — R64 Cachexia: Secondary | ICD-10-CM | POA: Diagnosis not present

## 2022-12-07 DIAGNOSIS — R159 Full incontinence of feces: Secondary | ICD-10-CM | POA: Diagnosis not present

## 2022-12-07 DIAGNOSIS — F02811 Dementia in other diseases classified elsewhere, unspecified severity, with agitation: Secondary | ICD-10-CM | POA: Diagnosis not present

## 2022-12-07 DIAGNOSIS — I10 Essential (primary) hypertension: Secondary | ICD-10-CM | POA: Diagnosis not present

## 2022-12-07 DIAGNOSIS — Z7401 Bed confinement status: Secondary | ICD-10-CM | POA: Diagnosis not present

## 2022-12-07 DIAGNOSIS — I69354 Hemiplegia and hemiparesis following cerebral infarction affecting left non-dominant side: Secondary | ICD-10-CM | POA: Diagnosis not present

## 2022-12-07 DIAGNOSIS — R32 Unspecified urinary incontinence: Secondary | ICD-10-CM | POA: Diagnosis not present

## 2022-12-07 DIAGNOSIS — G309 Alzheimer's disease, unspecified: Secondary | ICD-10-CM | POA: Diagnosis not present

## 2022-12-11 DIAGNOSIS — I10 Essential (primary) hypertension: Secondary | ICD-10-CM | POA: Diagnosis not present

## 2022-12-11 DIAGNOSIS — F0282 Dementia in other diseases classified elsewhere, unspecified severity, with psychotic disturbance: Secondary | ICD-10-CM | POA: Diagnosis not present

## 2022-12-11 DIAGNOSIS — I69354 Hemiplegia and hemiparesis following cerebral infarction affecting left non-dominant side: Secondary | ICD-10-CM | POA: Diagnosis not present

## 2022-12-11 DIAGNOSIS — G309 Alzheimer's disease, unspecified: Secondary | ICD-10-CM | POA: Diagnosis not present

## 2022-12-11 DIAGNOSIS — E43 Unspecified severe protein-calorie malnutrition: Secondary | ICD-10-CM | POA: Diagnosis not present

## 2022-12-11 DIAGNOSIS — F02811 Dementia in other diseases classified elsewhere, unspecified severity, with agitation: Secondary | ICD-10-CM | POA: Diagnosis not present

## 2022-12-14 DIAGNOSIS — F0282 Dementia in other diseases classified elsewhere, unspecified severity, with psychotic disturbance: Secondary | ICD-10-CM | POA: Diagnosis not present

## 2022-12-14 DIAGNOSIS — I10 Essential (primary) hypertension: Secondary | ICD-10-CM | POA: Diagnosis not present

## 2022-12-14 DIAGNOSIS — E43 Unspecified severe protein-calorie malnutrition: Secondary | ICD-10-CM | POA: Diagnosis not present

## 2022-12-14 DIAGNOSIS — I69354 Hemiplegia and hemiparesis following cerebral infarction affecting left non-dominant side: Secondary | ICD-10-CM | POA: Diagnosis not present

## 2022-12-14 DIAGNOSIS — G309 Alzheimer's disease, unspecified: Secondary | ICD-10-CM | POA: Diagnosis not present

## 2022-12-14 DIAGNOSIS — F02811 Dementia in other diseases classified elsewhere, unspecified severity, with agitation: Secondary | ICD-10-CM | POA: Diagnosis not present

## 2022-12-19 DIAGNOSIS — F02811 Dementia in other diseases classified elsewhere, unspecified severity, with agitation: Secondary | ICD-10-CM | POA: Diagnosis not present

## 2022-12-19 DIAGNOSIS — G309 Alzheimer's disease, unspecified: Secondary | ICD-10-CM | POA: Diagnosis not present

## 2022-12-19 DIAGNOSIS — I69354 Hemiplegia and hemiparesis following cerebral infarction affecting left non-dominant side: Secondary | ICD-10-CM | POA: Diagnosis not present

## 2022-12-19 DIAGNOSIS — E43 Unspecified severe protein-calorie malnutrition: Secondary | ICD-10-CM | POA: Diagnosis not present

## 2022-12-19 DIAGNOSIS — F0282 Dementia in other diseases classified elsewhere, unspecified severity, with psychotic disturbance: Secondary | ICD-10-CM | POA: Diagnosis not present

## 2022-12-19 DIAGNOSIS — I10 Essential (primary) hypertension: Secondary | ICD-10-CM | POA: Diagnosis not present

## 2022-12-21 DIAGNOSIS — F02811 Dementia in other diseases classified elsewhere, unspecified severity, with agitation: Secondary | ICD-10-CM | POA: Diagnosis not present

## 2022-12-21 DIAGNOSIS — F0282 Dementia in other diseases classified elsewhere, unspecified severity, with psychotic disturbance: Secondary | ICD-10-CM | POA: Diagnosis not present

## 2022-12-21 DIAGNOSIS — I69354 Hemiplegia and hemiparesis following cerebral infarction affecting left non-dominant side: Secondary | ICD-10-CM | POA: Diagnosis not present

## 2022-12-21 DIAGNOSIS — G309 Alzheimer's disease, unspecified: Secondary | ICD-10-CM | POA: Diagnosis not present

## 2022-12-21 DIAGNOSIS — I10 Essential (primary) hypertension: Secondary | ICD-10-CM | POA: Diagnosis not present

## 2022-12-21 DIAGNOSIS — E43 Unspecified severe protein-calorie malnutrition: Secondary | ICD-10-CM | POA: Diagnosis not present

## 2022-12-25 DIAGNOSIS — I10 Essential (primary) hypertension: Secondary | ICD-10-CM | POA: Diagnosis not present

## 2022-12-25 DIAGNOSIS — F02811 Dementia in other diseases classified elsewhere, unspecified severity, with agitation: Secondary | ICD-10-CM | POA: Diagnosis not present

## 2022-12-25 DIAGNOSIS — I69354 Hemiplegia and hemiparesis following cerebral infarction affecting left non-dominant side: Secondary | ICD-10-CM | POA: Diagnosis not present

## 2022-12-25 DIAGNOSIS — G309 Alzheimer's disease, unspecified: Secondary | ICD-10-CM | POA: Diagnosis not present

## 2022-12-25 DIAGNOSIS — F0282 Dementia in other diseases classified elsewhere, unspecified severity, with psychotic disturbance: Secondary | ICD-10-CM | POA: Diagnosis not present

## 2022-12-25 DIAGNOSIS — E43 Unspecified severe protein-calorie malnutrition: Secondary | ICD-10-CM | POA: Diagnosis not present

## 2022-12-28 DIAGNOSIS — E43 Unspecified severe protein-calorie malnutrition: Secondary | ICD-10-CM | POA: Diagnosis not present

## 2022-12-28 DIAGNOSIS — I69354 Hemiplegia and hemiparesis following cerebral infarction affecting left non-dominant side: Secondary | ICD-10-CM | POA: Diagnosis not present

## 2022-12-28 DIAGNOSIS — G309 Alzheimer's disease, unspecified: Secondary | ICD-10-CM | POA: Diagnosis not present

## 2022-12-28 DIAGNOSIS — I10 Essential (primary) hypertension: Secondary | ICD-10-CM | POA: Diagnosis not present

## 2022-12-28 DIAGNOSIS — F02811 Dementia in other diseases classified elsewhere, unspecified severity, with agitation: Secondary | ICD-10-CM | POA: Diagnosis not present

## 2022-12-28 DIAGNOSIS — F0282 Dementia in other diseases classified elsewhere, unspecified severity, with psychotic disturbance: Secondary | ICD-10-CM | POA: Diagnosis not present

## 2023-01-01 DIAGNOSIS — G309 Alzheimer's disease, unspecified: Secondary | ICD-10-CM | POA: Diagnosis not present

## 2023-01-01 DIAGNOSIS — F02811 Dementia in other diseases classified elsewhere, unspecified severity, with agitation: Secondary | ICD-10-CM | POA: Diagnosis not present

## 2023-01-01 DIAGNOSIS — F0282 Dementia in other diseases classified elsewhere, unspecified severity, with psychotic disturbance: Secondary | ICD-10-CM | POA: Diagnosis not present

## 2023-01-01 DIAGNOSIS — I69354 Hemiplegia and hemiparesis following cerebral infarction affecting left non-dominant side: Secondary | ICD-10-CM | POA: Diagnosis not present

## 2023-01-01 DIAGNOSIS — E43 Unspecified severe protein-calorie malnutrition: Secondary | ICD-10-CM | POA: Diagnosis not present

## 2023-01-01 DIAGNOSIS — I10 Essential (primary) hypertension: Secondary | ICD-10-CM | POA: Diagnosis not present

## 2023-01-04 DIAGNOSIS — F02811 Dementia in other diseases classified elsewhere, unspecified severity, with agitation: Secondary | ICD-10-CM | POA: Diagnosis not present

## 2023-01-04 DIAGNOSIS — I10 Essential (primary) hypertension: Secondary | ICD-10-CM | POA: Diagnosis not present

## 2023-01-04 DIAGNOSIS — G309 Alzheimer's disease, unspecified: Secondary | ICD-10-CM | POA: Diagnosis not present

## 2023-01-04 DIAGNOSIS — F0282 Dementia in other diseases classified elsewhere, unspecified severity, with psychotic disturbance: Secondary | ICD-10-CM | POA: Diagnosis not present

## 2023-01-04 DIAGNOSIS — I69354 Hemiplegia and hemiparesis following cerebral infarction affecting left non-dominant side: Secondary | ICD-10-CM | POA: Diagnosis not present

## 2023-01-04 DIAGNOSIS — E43 Unspecified severe protein-calorie malnutrition: Secondary | ICD-10-CM | POA: Diagnosis not present

## 2023-01-07 DIAGNOSIS — R32 Unspecified urinary incontinence: Secondary | ICD-10-CM | POA: Diagnosis not present

## 2023-01-07 DIAGNOSIS — I69354 Hemiplegia and hemiparesis following cerebral infarction affecting left non-dominant side: Secondary | ICD-10-CM | POA: Diagnosis not present

## 2023-01-07 DIAGNOSIS — Z681 Body mass index (BMI) 19 or less, adult: Secondary | ICD-10-CM | POA: Diagnosis not present

## 2023-01-07 DIAGNOSIS — L89152 Pressure ulcer of sacral region, stage 2: Secondary | ICD-10-CM | POA: Diagnosis not present

## 2023-01-07 DIAGNOSIS — Z9981 Dependence on supplemental oxygen: Secondary | ICD-10-CM | POA: Diagnosis not present

## 2023-01-07 DIAGNOSIS — R64 Cachexia: Secondary | ICD-10-CM | POA: Diagnosis not present

## 2023-01-07 DIAGNOSIS — I10 Essential (primary) hypertension: Secondary | ICD-10-CM | POA: Diagnosis not present

## 2023-01-07 DIAGNOSIS — Z741 Need for assistance with personal care: Secondary | ICD-10-CM | POA: Diagnosis not present

## 2023-01-07 DIAGNOSIS — Z7401 Bed confinement status: Secondary | ICD-10-CM | POA: Diagnosis not present

## 2023-01-07 DIAGNOSIS — E43 Unspecified severe protein-calorie malnutrition: Secondary | ICD-10-CM | POA: Diagnosis not present

## 2023-01-07 DIAGNOSIS — R159 Full incontinence of feces: Secondary | ICD-10-CM | POA: Diagnosis not present

## 2023-01-07 DIAGNOSIS — Z853 Personal history of malignant neoplasm of breast: Secondary | ICD-10-CM | POA: Diagnosis not present

## 2023-01-07 DIAGNOSIS — F0282 Dementia in other diseases classified elsewhere, unspecified severity, with psychotic disturbance: Secondary | ICD-10-CM | POA: Diagnosis not present

## 2023-01-07 DIAGNOSIS — G309 Alzheimer's disease, unspecified: Secondary | ICD-10-CM | POA: Diagnosis not present

## 2023-01-07 DIAGNOSIS — F02811 Dementia in other diseases classified elsewhere, unspecified severity, with agitation: Secondary | ICD-10-CM | POA: Diagnosis not present

## 2023-01-07 DIAGNOSIS — R634 Abnormal weight loss: Secondary | ICD-10-CM | POA: Diagnosis not present

## 2023-01-08 DIAGNOSIS — E43 Unspecified severe protein-calorie malnutrition: Secondary | ICD-10-CM | POA: Diagnosis not present

## 2023-01-08 DIAGNOSIS — I10 Essential (primary) hypertension: Secondary | ICD-10-CM | POA: Diagnosis not present

## 2023-01-08 DIAGNOSIS — F02811 Dementia in other diseases classified elsewhere, unspecified severity, with agitation: Secondary | ICD-10-CM | POA: Diagnosis not present

## 2023-01-08 DIAGNOSIS — F0282 Dementia in other diseases classified elsewhere, unspecified severity, with psychotic disturbance: Secondary | ICD-10-CM | POA: Diagnosis not present

## 2023-01-08 DIAGNOSIS — G309 Alzheimer's disease, unspecified: Secondary | ICD-10-CM | POA: Diagnosis not present

## 2023-01-08 DIAGNOSIS — I69354 Hemiplegia and hemiparesis following cerebral infarction affecting left non-dominant side: Secondary | ICD-10-CM | POA: Diagnosis not present

## 2023-01-11 DIAGNOSIS — F0282 Dementia in other diseases classified elsewhere, unspecified severity, with psychotic disturbance: Secondary | ICD-10-CM | POA: Diagnosis not present

## 2023-01-11 DIAGNOSIS — I10 Essential (primary) hypertension: Secondary | ICD-10-CM | POA: Diagnosis not present

## 2023-01-11 DIAGNOSIS — F02811 Dementia in other diseases classified elsewhere, unspecified severity, with agitation: Secondary | ICD-10-CM | POA: Diagnosis not present

## 2023-01-11 DIAGNOSIS — I69354 Hemiplegia and hemiparesis following cerebral infarction affecting left non-dominant side: Secondary | ICD-10-CM | POA: Diagnosis not present

## 2023-01-11 DIAGNOSIS — E43 Unspecified severe protein-calorie malnutrition: Secondary | ICD-10-CM | POA: Diagnosis not present

## 2023-01-11 DIAGNOSIS — G309 Alzheimer's disease, unspecified: Secondary | ICD-10-CM | POA: Diagnosis not present

## 2023-01-15 DIAGNOSIS — F0282 Dementia in other diseases classified elsewhere, unspecified severity, with psychotic disturbance: Secondary | ICD-10-CM | POA: Diagnosis not present

## 2023-01-15 DIAGNOSIS — E43 Unspecified severe protein-calorie malnutrition: Secondary | ICD-10-CM | POA: Diagnosis not present

## 2023-01-15 DIAGNOSIS — F02811 Dementia in other diseases classified elsewhere, unspecified severity, with agitation: Secondary | ICD-10-CM | POA: Diagnosis not present

## 2023-01-15 DIAGNOSIS — I10 Essential (primary) hypertension: Secondary | ICD-10-CM | POA: Diagnosis not present

## 2023-01-15 DIAGNOSIS — G309 Alzheimer's disease, unspecified: Secondary | ICD-10-CM | POA: Diagnosis not present

## 2023-01-15 DIAGNOSIS — I69354 Hemiplegia and hemiparesis following cerebral infarction affecting left non-dominant side: Secondary | ICD-10-CM | POA: Diagnosis not present

## 2023-01-18 DIAGNOSIS — I69354 Hemiplegia and hemiparesis following cerebral infarction affecting left non-dominant side: Secondary | ICD-10-CM | POA: Diagnosis not present

## 2023-01-18 DIAGNOSIS — F0282 Dementia in other diseases classified elsewhere, unspecified severity, with psychotic disturbance: Secondary | ICD-10-CM | POA: Diagnosis not present

## 2023-01-18 DIAGNOSIS — F02811 Dementia in other diseases classified elsewhere, unspecified severity, with agitation: Secondary | ICD-10-CM | POA: Diagnosis not present

## 2023-01-18 DIAGNOSIS — E43 Unspecified severe protein-calorie malnutrition: Secondary | ICD-10-CM | POA: Diagnosis not present

## 2023-01-18 DIAGNOSIS — G309 Alzheimer's disease, unspecified: Secondary | ICD-10-CM | POA: Diagnosis not present

## 2023-01-18 DIAGNOSIS — I10 Essential (primary) hypertension: Secondary | ICD-10-CM | POA: Diagnosis not present

## 2023-01-21 DIAGNOSIS — I10 Essential (primary) hypertension: Secondary | ICD-10-CM | POA: Diagnosis not present

## 2023-01-21 DIAGNOSIS — F02811 Dementia in other diseases classified elsewhere, unspecified severity, with agitation: Secondary | ICD-10-CM | POA: Diagnosis not present

## 2023-01-21 DIAGNOSIS — G309 Alzheimer's disease, unspecified: Secondary | ICD-10-CM | POA: Diagnosis not present

## 2023-01-21 DIAGNOSIS — E43 Unspecified severe protein-calorie malnutrition: Secondary | ICD-10-CM | POA: Diagnosis not present

## 2023-01-21 DIAGNOSIS — F0282 Dementia in other diseases classified elsewhere, unspecified severity, with psychotic disturbance: Secondary | ICD-10-CM | POA: Diagnosis not present

## 2023-01-21 DIAGNOSIS — I69354 Hemiplegia and hemiparesis following cerebral infarction affecting left non-dominant side: Secondary | ICD-10-CM | POA: Diagnosis not present

## 2023-01-22 DIAGNOSIS — F02811 Dementia in other diseases classified elsewhere, unspecified severity, with agitation: Secondary | ICD-10-CM | POA: Diagnosis not present

## 2023-01-22 DIAGNOSIS — F0282 Dementia in other diseases classified elsewhere, unspecified severity, with psychotic disturbance: Secondary | ICD-10-CM | POA: Diagnosis not present

## 2023-01-22 DIAGNOSIS — I69354 Hemiplegia and hemiparesis following cerebral infarction affecting left non-dominant side: Secondary | ICD-10-CM | POA: Diagnosis not present

## 2023-01-22 DIAGNOSIS — G309 Alzheimer's disease, unspecified: Secondary | ICD-10-CM | POA: Diagnosis not present

## 2023-01-22 DIAGNOSIS — E43 Unspecified severe protein-calorie malnutrition: Secondary | ICD-10-CM | POA: Diagnosis not present

## 2023-01-22 DIAGNOSIS — I10 Essential (primary) hypertension: Secondary | ICD-10-CM | POA: Diagnosis not present

## 2023-01-25 DIAGNOSIS — F0282 Dementia in other diseases classified elsewhere, unspecified severity, with psychotic disturbance: Secondary | ICD-10-CM | POA: Diagnosis not present

## 2023-01-25 DIAGNOSIS — I69354 Hemiplegia and hemiparesis following cerebral infarction affecting left non-dominant side: Secondary | ICD-10-CM | POA: Diagnosis not present

## 2023-01-25 DIAGNOSIS — G309 Alzheimer's disease, unspecified: Secondary | ICD-10-CM | POA: Diagnosis not present

## 2023-01-25 DIAGNOSIS — F02811 Dementia in other diseases classified elsewhere, unspecified severity, with agitation: Secondary | ICD-10-CM | POA: Diagnosis not present

## 2023-01-25 DIAGNOSIS — E43 Unspecified severe protein-calorie malnutrition: Secondary | ICD-10-CM | POA: Diagnosis not present

## 2023-01-25 DIAGNOSIS — I10 Essential (primary) hypertension: Secondary | ICD-10-CM | POA: Diagnosis not present

## 2023-01-29 DIAGNOSIS — G309 Alzheimer's disease, unspecified: Secondary | ICD-10-CM | POA: Diagnosis not present

## 2023-01-29 DIAGNOSIS — F02811 Dementia in other diseases classified elsewhere, unspecified severity, with agitation: Secondary | ICD-10-CM | POA: Diagnosis not present

## 2023-01-29 DIAGNOSIS — E43 Unspecified severe protein-calorie malnutrition: Secondary | ICD-10-CM | POA: Diagnosis not present

## 2023-01-29 DIAGNOSIS — F0282 Dementia in other diseases classified elsewhere, unspecified severity, with psychotic disturbance: Secondary | ICD-10-CM | POA: Diagnosis not present

## 2023-01-29 DIAGNOSIS — I10 Essential (primary) hypertension: Secondary | ICD-10-CM | POA: Diagnosis not present

## 2023-01-29 DIAGNOSIS — I69354 Hemiplegia and hemiparesis following cerebral infarction affecting left non-dominant side: Secondary | ICD-10-CM | POA: Diagnosis not present

## 2023-02-01 DIAGNOSIS — I69354 Hemiplegia and hemiparesis following cerebral infarction affecting left non-dominant side: Secondary | ICD-10-CM | POA: Diagnosis not present

## 2023-02-01 DIAGNOSIS — F0282 Dementia in other diseases classified elsewhere, unspecified severity, with psychotic disturbance: Secondary | ICD-10-CM | POA: Diagnosis not present

## 2023-02-01 DIAGNOSIS — I10 Essential (primary) hypertension: Secondary | ICD-10-CM | POA: Diagnosis not present

## 2023-02-01 DIAGNOSIS — F02811 Dementia in other diseases classified elsewhere, unspecified severity, with agitation: Secondary | ICD-10-CM | POA: Diagnosis not present

## 2023-02-01 DIAGNOSIS — E43 Unspecified severe protein-calorie malnutrition: Secondary | ICD-10-CM | POA: Diagnosis not present

## 2023-02-01 DIAGNOSIS — G309 Alzheimer's disease, unspecified: Secondary | ICD-10-CM | POA: Diagnosis not present

## 2023-02-05 DIAGNOSIS — F0282 Dementia in other diseases classified elsewhere, unspecified severity, with psychotic disturbance: Secondary | ICD-10-CM | POA: Diagnosis not present

## 2023-02-05 DIAGNOSIS — I69354 Hemiplegia and hemiparesis following cerebral infarction affecting left non-dominant side: Secondary | ICD-10-CM | POA: Diagnosis not present

## 2023-02-05 DIAGNOSIS — G309 Alzheimer's disease, unspecified: Secondary | ICD-10-CM | POA: Diagnosis not present

## 2023-02-05 DIAGNOSIS — I10 Essential (primary) hypertension: Secondary | ICD-10-CM | POA: Diagnosis not present

## 2023-02-05 DIAGNOSIS — F02811 Dementia in other diseases classified elsewhere, unspecified severity, with agitation: Secondary | ICD-10-CM | POA: Diagnosis not present

## 2023-02-05 DIAGNOSIS — E43 Unspecified severe protein-calorie malnutrition: Secondary | ICD-10-CM | POA: Diagnosis not present

## 2023-02-07 DIAGNOSIS — Z853 Personal history of malignant neoplasm of breast: Secondary | ICD-10-CM | POA: Diagnosis not present

## 2023-02-07 DIAGNOSIS — I69354 Hemiplegia and hemiparesis following cerebral infarction affecting left non-dominant side: Secondary | ICD-10-CM | POA: Diagnosis not present

## 2023-02-07 DIAGNOSIS — R32 Unspecified urinary incontinence: Secondary | ICD-10-CM | POA: Diagnosis not present

## 2023-02-07 DIAGNOSIS — R634 Abnormal weight loss: Secondary | ICD-10-CM | POA: Diagnosis not present

## 2023-02-07 DIAGNOSIS — F02811 Dementia in other diseases classified elsewhere, unspecified severity, with agitation: Secondary | ICD-10-CM | POA: Diagnosis not present

## 2023-02-07 DIAGNOSIS — F0282 Dementia in other diseases classified elsewhere, unspecified severity, with psychotic disturbance: Secondary | ICD-10-CM | POA: Diagnosis not present

## 2023-02-07 DIAGNOSIS — R159 Full incontinence of feces: Secondary | ICD-10-CM | POA: Diagnosis not present

## 2023-02-07 DIAGNOSIS — G309 Alzheimer's disease, unspecified: Secondary | ICD-10-CM | POA: Diagnosis not present

## 2023-02-07 DIAGNOSIS — R64 Cachexia: Secondary | ICD-10-CM | POA: Diagnosis not present

## 2023-02-07 DIAGNOSIS — L89152 Pressure ulcer of sacral region, stage 2: Secondary | ICD-10-CM | POA: Diagnosis not present

## 2023-02-07 DIAGNOSIS — I10 Essential (primary) hypertension: Secondary | ICD-10-CM | POA: Diagnosis not present

## 2023-02-07 DIAGNOSIS — E43 Unspecified severe protein-calorie malnutrition: Secondary | ICD-10-CM | POA: Diagnosis not present

## 2023-02-08 DIAGNOSIS — I69354 Hemiplegia and hemiparesis following cerebral infarction affecting left non-dominant side: Secondary | ICD-10-CM | POA: Diagnosis not present

## 2023-02-08 DIAGNOSIS — G309 Alzheimer's disease, unspecified: Secondary | ICD-10-CM | POA: Diagnosis not present

## 2023-02-08 DIAGNOSIS — F02811 Dementia in other diseases classified elsewhere, unspecified severity, with agitation: Secondary | ICD-10-CM | POA: Diagnosis not present

## 2023-02-08 DIAGNOSIS — F0282 Dementia in other diseases classified elsewhere, unspecified severity, with psychotic disturbance: Secondary | ICD-10-CM | POA: Diagnosis not present

## 2023-02-08 DIAGNOSIS — E43 Unspecified severe protein-calorie malnutrition: Secondary | ICD-10-CM | POA: Diagnosis not present

## 2023-02-08 DIAGNOSIS — I10 Essential (primary) hypertension: Secondary | ICD-10-CM | POA: Diagnosis not present

## 2023-02-09 DIAGNOSIS — F0282 Dementia in other diseases classified elsewhere, unspecified severity, with psychotic disturbance: Secondary | ICD-10-CM | POA: Diagnosis not present

## 2023-02-09 DIAGNOSIS — F02811 Dementia in other diseases classified elsewhere, unspecified severity, with agitation: Secondary | ICD-10-CM | POA: Diagnosis not present

## 2023-02-09 DIAGNOSIS — I69354 Hemiplegia and hemiparesis following cerebral infarction affecting left non-dominant side: Secondary | ICD-10-CM | POA: Diagnosis not present

## 2023-02-09 DIAGNOSIS — E43 Unspecified severe protein-calorie malnutrition: Secondary | ICD-10-CM | POA: Diagnosis not present

## 2023-02-09 DIAGNOSIS — G309 Alzheimer's disease, unspecified: Secondary | ICD-10-CM | POA: Diagnosis not present

## 2023-02-09 DIAGNOSIS — I10 Essential (primary) hypertension: Secondary | ICD-10-CM | POA: Diagnosis not present

## 2023-02-10 DIAGNOSIS — I10 Essential (primary) hypertension: Secondary | ICD-10-CM | POA: Diagnosis not present

## 2023-02-10 DIAGNOSIS — F0282 Dementia in other diseases classified elsewhere, unspecified severity, with psychotic disturbance: Secondary | ICD-10-CM | POA: Diagnosis not present

## 2023-02-10 DIAGNOSIS — G309 Alzheimer's disease, unspecified: Secondary | ICD-10-CM | POA: Diagnosis not present

## 2023-02-10 DIAGNOSIS — I69354 Hemiplegia and hemiparesis following cerebral infarction affecting left non-dominant side: Secondary | ICD-10-CM | POA: Diagnosis not present

## 2023-02-10 DIAGNOSIS — F02811 Dementia in other diseases classified elsewhere, unspecified severity, with agitation: Secondary | ICD-10-CM | POA: Diagnosis not present

## 2023-02-10 DIAGNOSIS — E43 Unspecified severe protein-calorie malnutrition: Secondary | ICD-10-CM | POA: Diagnosis not present

## 2023-02-11 DIAGNOSIS — F02811 Dementia in other diseases classified elsewhere, unspecified severity, with agitation: Secondary | ICD-10-CM | POA: Diagnosis not present

## 2023-02-11 DIAGNOSIS — I69354 Hemiplegia and hemiparesis following cerebral infarction affecting left non-dominant side: Secondary | ICD-10-CM | POA: Diagnosis not present

## 2023-02-11 DIAGNOSIS — F0282 Dementia in other diseases classified elsewhere, unspecified severity, with psychotic disturbance: Secondary | ICD-10-CM | POA: Diagnosis not present

## 2023-02-11 DIAGNOSIS — I10 Essential (primary) hypertension: Secondary | ICD-10-CM | POA: Diagnosis not present

## 2023-02-11 DIAGNOSIS — G309 Alzheimer's disease, unspecified: Secondary | ICD-10-CM | POA: Diagnosis not present

## 2023-02-11 DIAGNOSIS — E43 Unspecified severe protein-calorie malnutrition: Secondary | ICD-10-CM | POA: Diagnosis not present

## 2023-02-12 DIAGNOSIS — G309 Alzheimer's disease, unspecified: Secondary | ICD-10-CM | POA: Diagnosis not present

## 2023-02-12 DIAGNOSIS — E43 Unspecified severe protein-calorie malnutrition: Secondary | ICD-10-CM | POA: Diagnosis not present

## 2023-02-12 DIAGNOSIS — I69354 Hemiplegia and hemiparesis following cerebral infarction affecting left non-dominant side: Secondary | ICD-10-CM | POA: Diagnosis not present

## 2023-02-12 DIAGNOSIS — I10 Essential (primary) hypertension: Secondary | ICD-10-CM | POA: Diagnosis not present

## 2023-02-12 DIAGNOSIS — F02811 Dementia in other diseases classified elsewhere, unspecified severity, with agitation: Secondary | ICD-10-CM | POA: Diagnosis not present

## 2023-02-12 DIAGNOSIS — F0282 Dementia in other diseases classified elsewhere, unspecified severity, with psychotic disturbance: Secondary | ICD-10-CM | POA: Diagnosis not present

## 2023-02-13 DIAGNOSIS — I69354 Hemiplegia and hemiparesis following cerebral infarction affecting left non-dominant side: Secondary | ICD-10-CM | POA: Diagnosis not present

## 2023-02-13 DIAGNOSIS — E43 Unspecified severe protein-calorie malnutrition: Secondary | ICD-10-CM | POA: Diagnosis not present

## 2023-02-13 DIAGNOSIS — F0282 Dementia in other diseases classified elsewhere, unspecified severity, with psychotic disturbance: Secondary | ICD-10-CM | POA: Diagnosis not present

## 2023-02-13 DIAGNOSIS — I10 Essential (primary) hypertension: Secondary | ICD-10-CM | POA: Diagnosis not present

## 2023-02-13 DIAGNOSIS — F02811 Dementia in other diseases classified elsewhere, unspecified severity, with agitation: Secondary | ICD-10-CM | POA: Diagnosis not present

## 2023-02-13 DIAGNOSIS — G309 Alzheimer's disease, unspecified: Secondary | ICD-10-CM | POA: Diagnosis not present

## 2023-02-14 DIAGNOSIS — I10 Essential (primary) hypertension: Secondary | ICD-10-CM | POA: Diagnosis not present

## 2023-02-14 DIAGNOSIS — F0282 Dementia in other diseases classified elsewhere, unspecified severity, with psychotic disturbance: Secondary | ICD-10-CM | POA: Diagnosis not present

## 2023-02-14 DIAGNOSIS — G309 Alzheimer's disease, unspecified: Secondary | ICD-10-CM | POA: Diagnosis not present

## 2023-02-14 DIAGNOSIS — F02811 Dementia in other diseases classified elsewhere, unspecified severity, with agitation: Secondary | ICD-10-CM | POA: Diagnosis not present

## 2023-02-14 DIAGNOSIS — E43 Unspecified severe protein-calorie malnutrition: Secondary | ICD-10-CM | POA: Diagnosis not present

## 2023-02-14 DIAGNOSIS — I69354 Hemiplegia and hemiparesis following cerebral infarction affecting left non-dominant side: Secondary | ICD-10-CM | POA: Diagnosis not present

## 2023-02-15 DIAGNOSIS — G309 Alzheimer's disease, unspecified: Secondary | ICD-10-CM | POA: Diagnosis not present

## 2023-02-15 DIAGNOSIS — F02811 Dementia in other diseases classified elsewhere, unspecified severity, with agitation: Secondary | ICD-10-CM | POA: Diagnosis not present

## 2023-02-15 DIAGNOSIS — F0282 Dementia in other diseases classified elsewhere, unspecified severity, with psychotic disturbance: Secondary | ICD-10-CM | POA: Diagnosis not present

## 2023-02-15 DIAGNOSIS — I10 Essential (primary) hypertension: Secondary | ICD-10-CM | POA: Diagnosis not present

## 2023-02-15 DIAGNOSIS — E43 Unspecified severe protein-calorie malnutrition: Secondary | ICD-10-CM | POA: Diagnosis not present

## 2023-02-15 DIAGNOSIS — I69354 Hemiplegia and hemiparesis following cerebral infarction affecting left non-dominant side: Secondary | ICD-10-CM | POA: Diagnosis not present

## 2023-02-16 DIAGNOSIS — I10 Essential (primary) hypertension: Secondary | ICD-10-CM | POA: Diagnosis not present

## 2023-02-16 DIAGNOSIS — F0282 Dementia in other diseases classified elsewhere, unspecified severity, with psychotic disturbance: Secondary | ICD-10-CM | POA: Diagnosis not present

## 2023-02-16 DIAGNOSIS — E43 Unspecified severe protein-calorie malnutrition: Secondary | ICD-10-CM | POA: Diagnosis not present

## 2023-02-16 DIAGNOSIS — F02811 Dementia in other diseases classified elsewhere, unspecified severity, with agitation: Secondary | ICD-10-CM | POA: Diagnosis not present

## 2023-02-16 DIAGNOSIS — I69354 Hemiplegia and hemiparesis following cerebral infarction affecting left non-dominant side: Secondary | ICD-10-CM | POA: Diagnosis not present

## 2023-02-16 DIAGNOSIS — G309 Alzheimer's disease, unspecified: Secondary | ICD-10-CM | POA: Diagnosis not present

## 2023-02-17 DIAGNOSIS — G309 Alzheimer's disease, unspecified: Secondary | ICD-10-CM | POA: Diagnosis not present

## 2023-02-17 DIAGNOSIS — E43 Unspecified severe protein-calorie malnutrition: Secondary | ICD-10-CM | POA: Diagnosis not present

## 2023-02-17 DIAGNOSIS — I10 Essential (primary) hypertension: Secondary | ICD-10-CM | POA: Diagnosis not present

## 2023-02-17 DIAGNOSIS — F0282 Dementia in other diseases classified elsewhere, unspecified severity, with psychotic disturbance: Secondary | ICD-10-CM | POA: Diagnosis not present

## 2023-02-17 DIAGNOSIS — I69354 Hemiplegia and hemiparesis following cerebral infarction affecting left non-dominant side: Secondary | ICD-10-CM | POA: Diagnosis not present

## 2023-02-17 DIAGNOSIS — F02811 Dementia in other diseases classified elsewhere, unspecified severity, with agitation: Secondary | ICD-10-CM | POA: Diagnosis not present

## 2023-02-18 DIAGNOSIS — I69354 Hemiplegia and hemiparesis following cerebral infarction affecting left non-dominant side: Secondary | ICD-10-CM | POA: Diagnosis not present

## 2023-02-18 DIAGNOSIS — G309 Alzheimer's disease, unspecified: Secondary | ICD-10-CM | POA: Diagnosis not present

## 2023-02-18 DIAGNOSIS — E43 Unspecified severe protein-calorie malnutrition: Secondary | ICD-10-CM | POA: Diagnosis not present

## 2023-02-18 DIAGNOSIS — F02811 Dementia in other diseases classified elsewhere, unspecified severity, with agitation: Secondary | ICD-10-CM | POA: Diagnosis not present

## 2023-02-18 DIAGNOSIS — F0282 Dementia in other diseases classified elsewhere, unspecified severity, with psychotic disturbance: Secondary | ICD-10-CM | POA: Diagnosis not present

## 2023-02-18 DIAGNOSIS — I10 Essential (primary) hypertension: Secondary | ICD-10-CM | POA: Diagnosis not present

## 2023-02-19 DIAGNOSIS — G309 Alzheimer's disease, unspecified: Secondary | ICD-10-CM | POA: Diagnosis not present

## 2023-02-19 DIAGNOSIS — F0282 Dementia in other diseases classified elsewhere, unspecified severity, with psychotic disturbance: Secondary | ICD-10-CM | POA: Diagnosis not present

## 2023-02-19 DIAGNOSIS — F02811 Dementia in other diseases classified elsewhere, unspecified severity, with agitation: Secondary | ICD-10-CM | POA: Diagnosis not present

## 2023-02-19 DIAGNOSIS — I69354 Hemiplegia and hemiparesis following cerebral infarction affecting left non-dominant side: Secondary | ICD-10-CM | POA: Diagnosis not present

## 2023-02-19 DIAGNOSIS — E43 Unspecified severe protein-calorie malnutrition: Secondary | ICD-10-CM | POA: Diagnosis not present

## 2023-02-19 DIAGNOSIS — I10 Essential (primary) hypertension: Secondary | ICD-10-CM | POA: Diagnosis not present

## 2023-02-20 DIAGNOSIS — I10 Essential (primary) hypertension: Secondary | ICD-10-CM | POA: Diagnosis not present

## 2023-02-20 DIAGNOSIS — F0282 Dementia in other diseases classified elsewhere, unspecified severity, with psychotic disturbance: Secondary | ICD-10-CM | POA: Diagnosis not present

## 2023-02-20 DIAGNOSIS — E43 Unspecified severe protein-calorie malnutrition: Secondary | ICD-10-CM | POA: Diagnosis not present

## 2023-02-20 DIAGNOSIS — F02811 Dementia in other diseases classified elsewhere, unspecified severity, with agitation: Secondary | ICD-10-CM | POA: Diagnosis not present

## 2023-02-20 DIAGNOSIS — G309 Alzheimer's disease, unspecified: Secondary | ICD-10-CM | POA: Diagnosis not present

## 2023-02-20 DIAGNOSIS — I69354 Hemiplegia and hemiparesis following cerebral infarction affecting left non-dominant side: Secondary | ICD-10-CM | POA: Diagnosis not present

## 2023-02-21 DIAGNOSIS — F02811 Dementia in other diseases classified elsewhere, unspecified severity, with agitation: Secondary | ICD-10-CM | POA: Diagnosis not present

## 2023-02-21 DIAGNOSIS — G309 Alzheimer's disease, unspecified: Secondary | ICD-10-CM | POA: Diagnosis not present

## 2023-02-21 DIAGNOSIS — I69354 Hemiplegia and hemiparesis following cerebral infarction affecting left non-dominant side: Secondary | ICD-10-CM | POA: Diagnosis not present

## 2023-02-21 DIAGNOSIS — E43 Unspecified severe protein-calorie malnutrition: Secondary | ICD-10-CM | POA: Diagnosis not present

## 2023-02-21 DIAGNOSIS — I10 Essential (primary) hypertension: Secondary | ICD-10-CM | POA: Diagnosis not present

## 2023-02-21 DIAGNOSIS — F0282 Dementia in other diseases classified elsewhere, unspecified severity, with psychotic disturbance: Secondary | ICD-10-CM | POA: Diagnosis not present

## 2023-02-22 DIAGNOSIS — E43 Unspecified severe protein-calorie malnutrition: Secondary | ICD-10-CM | POA: Diagnosis not present

## 2023-02-22 DIAGNOSIS — G309 Alzheimer's disease, unspecified: Secondary | ICD-10-CM | POA: Diagnosis not present

## 2023-02-22 DIAGNOSIS — F0282 Dementia in other diseases classified elsewhere, unspecified severity, with psychotic disturbance: Secondary | ICD-10-CM | POA: Diagnosis not present

## 2023-02-22 DIAGNOSIS — F02811 Dementia in other diseases classified elsewhere, unspecified severity, with agitation: Secondary | ICD-10-CM | POA: Diagnosis not present

## 2023-02-22 DIAGNOSIS — I10 Essential (primary) hypertension: Secondary | ICD-10-CM | POA: Diagnosis not present

## 2023-02-22 DIAGNOSIS — I69354 Hemiplegia and hemiparesis following cerebral infarction affecting left non-dominant side: Secondary | ICD-10-CM | POA: Diagnosis not present

## 2023-02-23 DIAGNOSIS — F0282 Dementia in other diseases classified elsewhere, unspecified severity, with psychotic disturbance: Secondary | ICD-10-CM | POA: Diagnosis not present

## 2023-02-23 DIAGNOSIS — E43 Unspecified severe protein-calorie malnutrition: Secondary | ICD-10-CM | POA: Diagnosis not present

## 2023-02-23 DIAGNOSIS — I69354 Hemiplegia and hemiparesis following cerebral infarction affecting left non-dominant side: Secondary | ICD-10-CM | POA: Diagnosis not present

## 2023-02-23 DIAGNOSIS — I10 Essential (primary) hypertension: Secondary | ICD-10-CM | POA: Diagnosis not present

## 2023-02-23 DIAGNOSIS — F02811 Dementia in other diseases classified elsewhere, unspecified severity, with agitation: Secondary | ICD-10-CM | POA: Diagnosis not present

## 2023-02-23 DIAGNOSIS — G309 Alzheimer's disease, unspecified: Secondary | ICD-10-CM | POA: Diagnosis not present

## 2023-02-24 DIAGNOSIS — E43 Unspecified severe protein-calorie malnutrition: Secondary | ICD-10-CM | POA: Diagnosis not present

## 2023-02-24 DIAGNOSIS — F0282 Dementia in other diseases classified elsewhere, unspecified severity, with psychotic disturbance: Secondary | ICD-10-CM | POA: Diagnosis not present

## 2023-02-24 DIAGNOSIS — F02811 Dementia in other diseases classified elsewhere, unspecified severity, with agitation: Secondary | ICD-10-CM | POA: Diagnosis not present

## 2023-02-24 DIAGNOSIS — I10 Essential (primary) hypertension: Secondary | ICD-10-CM | POA: Diagnosis not present

## 2023-02-24 DIAGNOSIS — I69354 Hemiplegia and hemiparesis following cerebral infarction affecting left non-dominant side: Secondary | ICD-10-CM | POA: Diagnosis not present

## 2023-02-24 DIAGNOSIS — G309 Alzheimer's disease, unspecified: Secondary | ICD-10-CM | POA: Diagnosis not present

## 2023-02-25 DIAGNOSIS — E43 Unspecified severe protein-calorie malnutrition: Secondary | ICD-10-CM | POA: Diagnosis not present

## 2023-02-25 DIAGNOSIS — I69354 Hemiplegia and hemiparesis following cerebral infarction affecting left non-dominant side: Secondary | ICD-10-CM | POA: Diagnosis not present

## 2023-02-25 DIAGNOSIS — F02811 Dementia in other diseases classified elsewhere, unspecified severity, with agitation: Secondary | ICD-10-CM | POA: Diagnosis not present

## 2023-02-25 DIAGNOSIS — G309 Alzheimer's disease, unspecified: Secondary | ICD-10-CM | POA: Diagnosis not present

## 2023-02-25 DIAGNOSIS — I10 Essential (primary) hypertension: Secondary | ICD-10-CM | POA: Diagnosis not present

## 2023-02-25 DIAGNOSIS — F0282 Dementia in other diseases classified elsewhere, unspecified severity, with psychotic disturbance: Secondary | ICD-10-CM | POA: Diagnosis not present

## 2023-02-26 DIAGNOSIS — E43 Unspecified severe protein-calorie malnutrition: Secondary | ICD-10-CM | POA: Diagnosis not present

## 2023-02-26 DIAGNOSIS — F02811 Dementia in other diseases classified elsewhere, unspecified severity, with agitation: Secondary | ICD-10-CM | POA: Diagnosis not present

## 2023-02-26 DIAGNOSIS — F0282 Dementia in other diseases classified elsewhere, unspecified severity, with psychotic disturbance: Secondary | ICD-10-CM | POA: Diagnosis not present

## 2023-02-26 DIAGNOSIS — I10 Essential (primary) hypertension: Secondary | ICD-10-CM | POA: Diagnosis not present

## 2023-02-26 DIAGNOSIS — G309 Alzheimer's disease, unspecified: Secondary | ICD-10-CM | POA: Diagnosis not present

## 2023-02-26 DIAGNOSIS — I69354 Hemiplegia and hemiparesis following cerebral infarction affecting left non-dominant side: Secondary | ICD-10-CM | POA: Diagnosis not present

## 2023-02-27 DIAGNOSIS — I10 Essential (primary) hypertension: Secondary | ICD-10-CM | POA: Diagnosis not present

## 2023-02-27 DIAGNOSIS — G309 Alzheimer's disease, unspecified: Secondary | ICD-10-CM | POA: Diagnosis not present

## 2023-02-27 DIAGNOSIS — F0282 Dementia in other diseases classified elsewhere, unspecified severity, with psychotic disturbance: Secondary | ICD-10-CM | POA: Diagnosis not present

## 2023-02-27 DIAGNOSIS — E43 Unspecified severe protein-calorie malnutrition: Secondary | ICD-10-CM | POA: Diagnosis not present

## 2023-02-27 DIAGNOSIS — F02811 Dementia in other diseases classified elsewhere, unspecified severity, with agitation: Secondary | ICD-10-CM | POA: Diagnosis not present

## 2023-02-27 DIAGNOSIS — I69354 Hemiplegia and hemiparesis following cerebral infarction affecting left non-dominant side: Secondary | ICD-10-CM | POA: Diagnosis not present

## 2023-02-28 DIAGNOSIS — E43 Unspecified severe protein-calorie malnutrition: Secondary | ICD-10-CM | POA: Diagnosis not present

## 2023-02-28 DIAGNOSIS — G309 Alzheimer's disease, unspecified: Secondary | ICD-10-CM | POA: Diagnosis not present

## 2023-02-28 DIAGNOSIS — I10 Essential (primary) hypertension: Secondary | ICD-10-CM | POA: Diagnosis not present

## 2023-02-28 DIAGNOSIS — I69354 Hemiplegia and hemiparesis following cerebral infarction affecting left non-dominant side: Secondary | ICD-10-CM | POA: Diagnosis not present

## 2023-02-28 DIAGNOSIS — F0282 Dementia in other diseases classified elsewhere, unspecified severity, with psychotic disturbance: Secondary | ICD-10-CM | POA: Diagnosis not present

## 2023-02-28 DIAGNOSIS — F02811 Dementia in other diseases classified elsewhere, unspecified severity, with agitation: Secondary | ICD-10-CM | POA: Diagnosis not present

## 2023-03-01 DIAGNOSIS — I69354 Hemiplegia and hemiparesis following cerebral infarction affecting left non-dominant side: Secondary | ICD-10-CM | POA: Diagnosis not present

## 2023-03-01 DIAGNOSIS — F0282 Dementia in other diseases classified elsewhere, unspecified severity, with psychotic disturbance: Secondary | ICD-10-CM | POA: Diagnosis not present

## 2023-03-01 DIAGNOSIS — F02811 Dementia in other diseases classified elsewhere, unspecified severity, with agitation: Secondary | ICD-10-CM | POA: Diagnosis not present

## 2023-03-01 DIAGNOSIS — I10 Essential (primary) hypertension: Secondary | ICD-10-CM | POA: Diagnosis not present

## 2023-03-01 DIAGNOSIS — E43 Unspecified severe protein-calorie malnutrition: Secondary | ICD-10-CM | POA: Diagnosis not present

## 2023-03-01 DIAGNOSIS — G309 Alzheimer's disease, unspecified: Secondary | ICD-10-CM | POA: Diagnosis not present

## 2023-03-02 DIAGNOSIS — G309 Alzheimer's disease, unspecified: Secondary | ICD-10-CM | POA: Diagnosis not present

## 2023-03-02 DIAGNOSIS — E43 Unspecified severe protein-calorie malnutrition: Secondary | ICD-10-CM | POA: Diagnosis not present

## 2023-03-02 DIAGNOSIS — F02811 Dementia in other diseases classified elsewhere, unspecified severity, with agitation: Secondary | ICD-10-CM | POA: Diagnosis not present

## 2023-03-02 DIAGNOSIS — F0282 Dementia in other diseases classified elsewhere, unspecified severity, with psychotic disturbance: Secondary | ICD-10-CM | POA: Diagnosis not present

## 2023-03-02 DIAGNOSIS — I69354 Hemiplegia and hemiparesis following cerebral infarction affecting left non-dominant side: Secondary | ICD-10-CM | POA: Diagnosis not present

## 2023-03-02 DIAGNOSIS — I10 Essential (primary) hypertension: Secondary | ICD-10-CM | POA: Diagnosis not present

## 2023-03-03 DIAGNOSIS — I10 Essential (primary) hypertension: Secondary | ICD-10-CM | POA: Diagnosis not present

## 2023-03-03 DIAGNOSIS — G309 Alzheimer's disease, unspecified: Secondary | ICD-10-CM | POA: Diagnosis not present

## 2023-03-03 DIAGNOSIS — I69354 Hemiplegia and hemiparesis following cerebral infarction affecting left non-dominant side: Secondary | ICD-10-CM | POA: Diagnosis not present

## 2023-03-03 DIAGNOSIS — F0282 Dementia in other diseases classified elsewhere, unspecified severity, with psychotic disturbance: Secondary | ICD-10-CM | POA: Diagnosis not present

## 2023-03-03 DIAGNOSIS — E43 Unspecified severe protein-calorie malnutrition: Secondary | ICD-10-CM | POA: Diagnosis not present

## 2023-03-03 DIAGNOSIS — F02811 Dementia in other diseases classified elsewhere, unspecified severity, with agitation: Secondary | ICD-10-CM | POA: Diagnosis not present

## 2023-03-04 DIAGNOSIS — G309 Alzheimer's disease, unspecified: Secondary | ICD-10-CM | POA: Diagnosis not present

## 2023-03-04 DIAGNOSIS — E43 Unspecified severe protein-calorie malnutrition: Secondary | ICD-10-CM | POA: Diagnosis not present

## 2023-03-04 DIAGNOSIS — I10 Essential (primary) hypertension: Secondary | ICD-10-CM | POA: Diagnosis not present

## 2023-03-04 DIAGNOSIS — I69354 Hemiplegia and hemiparesis following cerebral infarction affecting left non-dominant side: Secondary | ICD-10-CM | POA: Diagnosis not present

## 2023-03-04 DIAGNOSIS — F0282 Dementia in other diseases classified elsewhere, unspecified severity, with psychotic disturbance: Secondary | ICD-10-CM | POA: Diagnosis not present

## 2023-03-04 DIAGNOSIS — F02811 Dementia in other diseases classified elsewhere, unspecified severity, with agitation: Secondary | ICD-10-CM | POA: Diagnosis not present

## 2023-03-05 DIAGNOSIS — F0282 Dementia in other diseases classified elsewhere, unspecified severity, with psychotic disturbance: Secondary | ICD-10-CM | POA: Diagnosis not present

## 2023-03-05 DIAGNOSIS — E43 Unspecified severe protein-calorie malnutrition: Secondary | ICD-10-CM | POA: Diagnosis not present

## 2023-03-05 DIAGNOSIS — I69354 Hemiplegia and hemiparesis following cerebral infarction affecting left non-dominant side: Secondary | ICD-10-CM | POA: Diagnosis not present

## 2023-03-05 DIAGNOSIS — G309 Alzheimer's disease, unspecified: Secondary | ICD-10-CM | POA: Diagnosis not present

## 2023-03-05 DIAGNOSIS — I10 Essential (primary) hypertension: Secondary | ICD-10-CM | POA: Diagnosis not present

## 2023-03-05 DIAGNOSIS — F02811 Dementia in other diseases classified elsewhere, unspecified severity, with agitation: Secondary | ICD-10-CM | POA: Diagnosis not present

## 2023-03-06 DIAGNOSIS — F0282 Dementia in other diseases classified elsewhere, unspecified severity, with psychotic disturbance: Secondary | ICD-10-CM | POA: Diagnosis not present

## 2023-03-06 DIAGNOSIS — E43 Unspecified severe protein-calorie malnutrition: Secondary | ICD-10-CM | POA: Diagnosis not present

## 2023-03-06 DIAGNOSIS — I69354 Hemiplegia and hemiparesis following cerebral infarction affecting left non-dominant side: Secondary | ICD-10-CM | POA: Diagnosis not present

## 2023-03-06 DIAGNOSIS — I10 Essential (primary) hypertension: Secondary | ICD-10-CM | POA: Diagnosis not present

## 2023-03-06 DIAGNOSIS — F02811 Dementia in other diseases classified elsewhere, unspecified severity, with agitation: Secondary | ICD-10-CM | POA: Diagnosis not present

## 2023-03-06 DIAGNOSIS — G309 Alzheimer's disease, unspecified: Secondary | ICD-10-CM | POA: Diagnosis not present

## 2023-03-07 DIAGNOSIS — F0282 Dementia in other diseases classified elsewhere, unspecified severity, with psychotic disturbance: Secondary | ICD-10-CM | POA: Diagnosis not present

## 2023-03-07 DIAGNOSIS — G309 Alzheimer's disease, unspecified: Secondary | ICD-10-CM | POA: Diagnosis not present

## 2023-03-07 DIAGNOSIS — F02811 Dementia in other diseases classified elsewhere, unspecified severity, with agitation: Secondary | ICD-10-CM | POA: Diagnosis not present

## 2023-03-07 DIAGNOSIS — I69354 Hemiplegia and hemiparesis following cerebral infarction affecting left non-dominant side: Secondary | ICD-10-CM | POA: Diagnosis not present

## 2023-03-07 DIAGNOSIS — I10 Essential (primary) hypertension: Secondary | ICD-10-CM | POA: Diagnosis not present

## 2023-03-07 DIAGNOSIS — E43 Unspecified severe protein-calorie malnutrition: Secondary | ICD-10-CM | POA: Diagnosis not present

## 2023-03-08 DIAGNOSIS — I69354 Hemiplegia and hemiparesis following cerebral infarction affecting left non-dominant side: Secondary | ICD-10-CM | POA: Diagnosis not present

## 2023-03-08 DIAGNOSIS — I10 Essential (primary) hypertension: Secondary | ICD-10-CM | POA: Diagnosis not present

## 2023-03-08 DIAGNOSIS — E43 Unspecified severe protein-calorie malnutrition: Secondary | ICD-10-CM | POA: Diagnosis not present

## 2023-03-08 DIAGNOSIS — G309 Alzheimer's disease, unspecified: Secondary | ICD-10-CM | POA: Diagnosis not present

## 2023-03-08 DIAGNOSIS — F02811 Dementia in other diseases classified elsewhere, unspecified severity, with agitation: Secondary | ICD-10-CM | POA: Diagnosis not present

## 2023-03-08 DIAGNOSIS — F0282 Dementia in other diseases classified elsewhere, unspecified severity, with psychotic disturbance: Secondary | ICD-10-CM | POA: Diagnosis not present

## 2023-03-09 DIAGNOSIS — Z853 Personal history of malignant neoplasm of breast: Secondary | ICD-10-CM | POA: Diagnosis not present

## 2023-03-09 DIAGNOSIS — R634 Abnormal weight loss: Secondary | ICD-10-CM | POA: Diagnosis not present

## 2023-03-09 DIAGNOSIS — I10 Essential (primary) hypertension: Secondary | ICD-10-CM | POA: Diagnosis not present

## 2023-03-09 DIAGNOSIS — R64 Cachexia: Secondary | ICD-10-CM | POA: Diagnosis not present

## 2023-03-09 DIAGNOSIS — F02811 Dementia in other diseases classified elsewhere, unspecified severity, with agitation: Secondary | ICD-10-CM | POA: Diagnosis not present

## 2023-03-09 DIAGNOSIS — G309 Alzheimer's disease, unspecified: Secondary | ICD-10-CM | POA: Diagnosis not present

## 2023-03-09 DIAGNOSIS — E43 Unspecified severe protein-calorie malnutrition: Secondary | ICD-10-CM | POA: Diagnosis not present

## 2023-03-09 DIAGNOSIS — L89152 Pressure ulcer of sacral region, stage 2: Secondary | ICD-10-CM | POA: Diagnosis not present

## 2023-03-09 DIAGNOSIS — I69354 Hemiplegia and hemiparesis following cerebral infarction affecting left non-dominant side: Secondary | ICD-10-CM | POA: Diagnosis not present

## 2023-03-09 DIAGNOSIS — R32 Unspecified urinary incontinence: Secondary | ICD-10-CM | POA: Diagnosis not present

## 2023-03-09 DIAGNOSIS — R159 Full incontinence of feces: Secondary | ICD-10-CM | POA: Diagnosis not present

## 2023-03-09 DIAGNOSIS — F0282 Dementia in other diseases classified elsewhere, unspecified severity, with psychotic disturbance: Secondary | ICD-10-CM | POA: Diagnosis not present

## 2023-03-10 DIAGNOSIS — I10 Essential (primary) hypertension: Secondary | ICD-10-CM | POA: Diagnosis not present

## 2023-03-10 DIAGNOSIS — F02811 Dementia in other diseases classified elsewhere, unspecified severity, with agitation: Secondary | ICD-10-CM | POA: Diagnosis not present

## 2023-03-10 DIAGNOSIS — I69354 Hemiplegia and hemiparesis following cerebral infarction affecting left non-dominant side: Secondary | ICD-10-CM | POA: Diagnosis not present

## 2023-03-10 DIAGNOSIS — E43 Unspecified severe protein-calorie malnutrition: Secondary | ICD-10-CM | POA: Diagnosis not present

## 2023-03-10 DIAGNOSIS — G309 Alzheimer's disease, unspecified: Secondary | ICD-10-CM | POA: Diagnosis not present

## 2023-03-10 DIAGNOSIS — F0282 Dementia in other diseases classified elsewhere, unspecified severity, with psychotic disturbance: Secondary | ICD-10-CM | POA: Diagnosis not present

## 2023-03-11 DIAGNOSIS — I69354 Hemiplegia and hemiparesis following cerebral infarction affecting left non-dominant side: Secondary | ICD-10-CM | POA: Diagnosis not present

## 2023-03-11 DIAGNOSIS — F0282 Dementia in other diseases classified elsewhere, unspecified severity, with psychotic disturbance: Secondary | ICD-10-CM | POA: Diagnosis not present

## 2023-03-11 DIAGNOSIS — I10 Essential (primary) hypertension: Secondary | ICD-10-CM | POA: Diagnosis not present

## 2023-03-11 DIAGNOSIS — F02811 Dementia in other diseases classified elsewhere, unspecified severity, with agitation: Secondary | ICD-10-CM | POA: Diagnosis not present

## 2023-03-11 DIAGNOSIS — E43 Unspecified severe protein-calorie malnutrition: Secondary | ICD-10-CM | POA: Diagnosis not present

## 2023-03-11 DIAGNOSIS — G309 Alzheimer's disease, unspecified: Secondary | ICD-10-CM | POA: Diagnosis not present

## 2023-03-12 DIAGNOSIS — I10 Essential (primary) hypertension: Secondary | ICD-10-CM | POA: Diagnosis not present

## 2023-03-12 DIAGNOSIS — E43 Unspecified severe protein-calorie malnutrition: Secondary | ICD-10-CM | POA: Diagnosis not present

## 2023-03-12 DIAGNOSIS — G309 Alzheimer's disease, unspecified: Secondary | ICD-10-CM | POA: Diagnosis not present

## 2023-03-12 DIAGNOSIS — F02811 Dementia in other diseases classified elsewhere, unspecified severity, with agitation: Secondary | ICD-10-CM | POA: Diagnosis not present

## 2023-03-12 DIAGNOSIS — F0282 Dementia in other diseases classified elsewhere, unspecified severity, with psychotic disturbance: Secondary | ICD-10-CM | POA: Diagnosis not present

## 2023-03-12 DIAGNOSIS — I69354 Hemiplegia and hemiparesis following cerebral infarction affecting left non-dominant side: Secondary | ICD-10-CM | POA: Diagnosis not present

## 2023-03-13 DIAGNOSIS — I69354 Hemiplegia and hemiparesis following cerebral infarction affecting left non-dominant side: Secondary | ICD-10-CM | POA: Diagnosis not present

## 2023-03-13 DIAGNOSIS — F0282 Dementia in other diseases classified elsewhere, unspecified severity, with psychotic disturbance: Secondary | ICD-10-CM | POA: Diagnosis not present

## 2023-03-13 DIAGNOSIS — I10 Essential (primary) hypertension: Secondary | ICD-10-CM | POA: Diagnosis not present

## 2023-03-13 DIAGNOSIS — F02811 Dementia in other diseases classified elsewhere, unspecified severity, with agitation: Secondary | ICD-10-CM | POA: Diagnosis not present

## 2023-03-13 DIAGNOSIS — G309 Alzheimer's disease, unspecified: Secondary | ICD-10-CM | POA: Diagnosis not present

## 2023-03-13 DIAGNOSIS — E43 Unspecified severe protein-calorie malnutrition: Secondary | ICD-10-CM | POA: Diagnosis not present

## 2023-03-14 DIAGNOSIS — F02811 Dementia in other diseases classified elsewhere, unspecified severity, with agitation: Secondary | ICD-10-CM | POA: Diagnosis not present

## 2023-03-14 DIAGNOSIS — E43 Unspecified severe protein-calorie malnutrition: Secondary | ICD-10-CM | POA: Diagnosis not present

## 2023-03-14 DIAGNOSIS — I10 Essential (primary) hypertension: Secondary | ICD-10-CM | POA: Diagnosis not present

## 2023-03-14 DIAGNOSIS — I69354 Hemiplegia and hemiparesis following cerebral infarction affecting left non-dominant side: Secondary | ICD-10-CM | POA: Diagnosis not present

## 2023-03-14 DIAGNOSIS — F0282 Dementia in other diseases classified elsewhere, unspecified severity, with psychotic disturbance: Secondary | ICD-10-CM | POA: Diagnosis not present

## 2023-03-14 DIAGNOSIS — G309 Alzheimer's disease, unspecified: Secondary | ICD-10-CM | POA: Diagnosis not present

## 2023-03-15 DIAGNOSIS — F0282 Dementia in other diseases classified elsewhere, unspecified severity, with psychotic disturbance: Secondary | ICD-10-CM | POA: Diagnosis not present

## 2023-03-15 DIAGNOSIS — E43 Unspecified severe protein-calorie malnutrition: Secondary | ICD-10-CM | POA: Diagnosis not present

## 2023-03-15 DIAGNOSIS — I69354 Hemiplegia and hemiparesis following cerebral infarction affecting left non-dominant side: Secondary | ICD-10-CM | POA: Diagnosis not present

## 2023-03-15 DIAGNOSIS — G309 Alzheimer's disease, unspecified: Secondary | ICD-10-CM | POA: Diagnosis not present

## 2023-03-15 DIAGNOSIS — I10 Essential (primary) hypertension: Secondary | ICD-10-CM | POA: Diagnosis not present

## 2023-03-15 DIAGNOSIS — F02811 Dementia in other diseases classified elsewhere, unspecified severity, with agitation: Secondary | ICD-10-CM | POA: Diagnosis not present

## 2023-03-16 DIAGNOSIS — E43 Unspecified severe protein-calorie malnutrition: Secondary | ICD-10-CM | POA: Diagnosis not present

## 2023-03-16 DIAGNOSIS — I10 Essential (primary) hypertension: Secondary | ICD-10-CM | POA: Diagnosis not present

## 2023-03-16 DIAGNOSIS — F02811 Dementia in other diseases classified elsewhere, unspecified severity, with agitation: Secondary | ICD-10-CM | POA: Diagnosis not present

## 2023-03-16 DIAGNOSIS — I69354 Hemiplegia and hemiparesis following cerebral infarction affecting left non-dominant side: Secondary | ICD-10-CM | POA: Diagnosis not present

## 2023-03-16 DIAGNOSIS — F0282 Dementia in other diseases classified elsewhere, unspecified severity, with psychotic disturbance: Secondary | ICD-10-CM | POA: Diagnosis not present

## 2023-03-16 DIAGNOSIS — G309 Alzheimer's disease, unspecified: Secondary | ICD-10-CM | POA: Diagnosis not present

## 2023-03-17 DIAGNOSIS — F02811 Dementia in other diseases classified elsewhere, unspecified severity, with agitation: Secondary | ICD-10-CM | POA: Diagnosis not present

## 2023-03-17 DIAGNOSIS — F0282 Dementia in other diseases classified elsewhere, unspecified severity, with psychotic disturbance: Secondary | ICD-10-CM | POA: Diagnosis not present

## 2023-03-17 DIAGNOSIS — E43 Unspecified severe protein-calorie malnutrition: Secondary | ICD-10-CM | POA: Diagnosis not present

## 2023-03-17 DIAGNOSIS — G309 Alzheimer's disease, unspecified: Secondary | ICD-10-CM | POA: Diagnosis not present

## 2023-03-17 DIAGNOSIS — I69354 Hemiplegia and hemiparesis following cerebral infarction affecting left non-dominant side: Secondary | ICD-10-CM | POA: Diagnosis not present

## 2023-03-17 DIAGNOSIS — I10 Essential (primary) hypertension: Secondary | ICD-10-CM | POA: Diagnosis not present

## 2023-03-18 DIAGNOSIS — E43 Unspecified severe protein-calorie malnutrition: Secondary | ICD-10-CM | POA: Diagnosis not present

## 2023-03-18 DIAGNOSIS — F0282 Dementia in other diseases classified elsewhere, unspecified severity, with psychotic disturbance: Secondary | ICD-10-CM | POA: Diagnosis not present

## 2023-03-18 DIAGNOSIS — F02811 Dementia in other diseases classified elsewhere, unspecified severity, with agitation: Secondary | ICD-10-CM | POA: Diagnosis not present

## 2023-03-18 DIAGNOSIS — G309 Alzheimer's disease, unspecified: Secondary | ICD-10-CM | POA: Diagnosis not present

## 2023-03-18 DIAGNOSIS — I10 Essential (primary) hypertension: Secondary | ICD-10-CM | POA: Diagnosis not present

## 2023-03-18 DIAGNOSIS — I69354 Hemiplegia and hemiparesis following cerebral infarction affecting left non-dominant side: Secondary | ICD-10-CM | POA: Diagnosis not present

## 2023-03-19 DIAGNOSIS — I69354 Hemiplegia and hemiparesis following cerebral infarction affecting left non-dominant side: Secondary | ICD-10-CM | POA: Diagnosis not present

## 2023-03-19 DIAGNOSIS — E43 Unspecified severe protein-calorie malnutrition: Secondary | ICD-10-CM | POA: Diagnosis not present

## 2023-03-19 DIAGNOSIS — I10 Essential (primary) hypertension: Secondary | ICD-10-CM | POA: Diagnosis not present

## 2023-03-19 DIAGNOSIS — F02811 Dementia in other diseases classified elsewhere, unspecified severity, with agitation: Secondary | ICD-10-CM | POA: Diagnosis not present

## 2023-03-19 DIAGNOSIS — F0282 Dementia in other diseases classified elsewhere, unspecified severity, with psychotic disturbance: Secondary | ICD-10-CM | POA: Diagnosis not present

## 2023-03-19 DIAGNOSIS — G309 Alzheimer's disease, unspecified: Secondary | ICD-10-CM | POA: Diagnosis not present

## 2023-03-20 DIAGNOSIS — G309 Alzheimer's disease, unspecified: Secondary | ICD-10-CM | POA: Diagnosis not present

## 2023-03-20 DIAGNOSIS — E43 Unspecified severe protein-calorie malnutrition: Secondary | ICD-10-CM | POA: Diagnosis not present

## 2023-03-20 DIAGNOSIS — F0282 Dementia in other diseases classified elsewhere, unspecified severity, with psychotic disturbance: Secondary | ICD-10-CM | POA: Diagnosis not present

## 2023-03-20 DIAGNOSIS — I10 Essential (primary) hypertension: Secondary | ICD-10-CM | POA: Diagnosis not present

## 2023-03-20 DIAGNOSIS — F02811 Dementia in other diseases classified elsewhere, unspecified severity, with agitation: Secondary | ICD-10-CM | POA: Diagnosis not present

## 2023-03-20 DIAGNOSIS — I69354 Hemiplegia and hemiparesis following cerebral infarction affecting left non-dominant side: Secondary | ICD-10-CM | POA: Diagnosis not present

## 2023-03-21 DIAGNOSIS — I10 Essential (primary) hypertension: Secondary | ICD-10-CM | POA: Diagnosis not present

## 2023-03-21 DIAGNOSIS — G309 Alzheimer's disease, unspecified: Secondary | ICD-10-CM | POA: Diagnosis not present

## 2023-03-21 DIAGNOSIS — I69354 Hemiplegia and hemiparesis following cerebral infarction affecting left non-dominant side: Secondary | ICD-10-CM | POA: Diagnosis not present

## 2023-03-21 DIAGNOSIS — F02811 Dementia in other diseases classified elsewhere, unspecified severity, with agitation: Secondary | ICD-10-CM | POA: Diagnosis not present

## 2023-03-21 DIAGNOSIS — F0282 Dementia in other diseases classified elsewhere, unspecified severity, with psychotic disturbance: Secondary | ICD-10-CM | POA: Diagnosis not present

## 2023-03-21 DIAGNOSIS — E43 Unspecified severe protein-calorie malnutrition: Secondary | ICD-10-CM | POA: Diagnosis not present

## 2023-03-22 DIAGNOSIS — F0282 Dementia in other diseases classified elsewhere, unspecified severity, with psychotic disturbance: Secondary | ICD-10-CM | POA: Diagnosis not present

## 2023-03-22 DIAGNOSIS — I69354 Hemiplegia and hemiparesis following cerebral infarction affecting left non-dominant side: Secondary | ICD-10-CM | POA: Diagnosis not present

## 2023-03-22 DIAGNOSIS — G309 Alzheimer's disease, unspecified: Secondary | ICD-10-CM | POA: Diagnosis not present

## 2023-03-22 DIAGNOSIS — I10 Essential (primary) hypertension: Secondary | ICD-10-CM | POA: Diagnosis not present

## 2023-03-22 DIAGNOSIS — E43 Unspecified severe protein-calorie malnutrition: Secondary | ICD-10-CM | POA: Diagnosis not present

## 2023-03-22 DIAGNOSIS — F02811 Dementia in other diseases classified elsewhere, unspecified severity, with agitation: Secondary | ICD-10-CM | POA: Diagnosis not present

## 2023-03-23 DIAGNOSIS — E43 Unspecified severe protein-calorie malnutrition: Secondary | ICD-10-CM | POA: Diagnosis not present

## 2023-03-23 DIAGNOSIS — F0282 Dementia in other diseases classified elsewhere, unspecified severity, with psychotic disturbance: Secondary | ICD-10-CM | POA: Diagnosis not present

## 2023-03-23 DIAGNOSIS — F02811 Dementia in other diseases classified elsewhere, unspecified severity, with agitation: Secondary | ICD-10-CM | POA: Diagnosis not present

## 2023-03-23 DIAGNOSIS — I69354 Hemiplegia and hemiparesis following cerebral infarction affecting left non-dominant side: Secondary | ICD-10-CM | POA: Diagnosis not present

## 2023-03-23 DIAGNOSIS — G309 Alzheimer's disease, unspecified: Secondary | ICD-10-CM | POA: Diagnosis not present

## 2023-03-23 DIAGNOSIS — I10 Essential (primary) hypertension: Secondary | ICD-10-CM | POA: Diagnosis not present

## 2023-03-24 DIAGNOSIS — E43 Unspecified severe protein-calorie malnutrition: Secondary | ICD-10-CM | POA: Diagnosis not present

## 2023-03-24 DIAGNOSIS — F02811 Dementia in other diseases classified elsewhere, unspecified severity, with agitation: Secondary | ICD-10-CM | POA: Diagnosis not present

## 2023-03-24 DIAGNOSIS — I69354 Hemiplegia and hemiparesis following cerebral infarction affecting left non-dominant side: Secondary | ICD-10-CM | POA: Diagnosis not present

## 2023-03-24 DIAGNOSIS — I10 Essential (primary) hypertension: Secondary | ICD-10-CM | POA: Diagnosis not present

## 2023-03-24 DIAGNOSIS — G309 Alzheimer's disease, unspecified: Secondary | ICD-10-CM | POA: Diagnosis not present

## 2023-03-24 DIAGNOSIS — F0282 Dementia in other diseases classified elsewhere, unspecified severity, with psychotic disturbance: Secondary | ICD-10-CM | POA: Diagnosis not present

## 2023-03-25 DIAGNOSIS — I10 Essential (primary) hypertension: Secondary | ICD-10-CM | POA: Diagnosis not present

## 2023-03-25 DIAGNOSIS — G309 Alzheimer's disease, unspecified: Secondary | ICD-10-CM | POA: Diagnosis not present

## 2023-03-25 DIAGNOSIS — E43 Unspecified severe protein-calorie malnutrition: Secondary | ICD-10-CM | POA: Diagnosis not present

## 2023-03-25 DIAGNOSIS — I69354 Hemiplegia and hemiparesis following cerebral infarction affecting left non-dominant side: Secondary | ICD-10-CM | POA: Diagnosis not present

## 2023-03-25 DIAGNOSIS — F02811 Dementia in other diseases classified elsewhere, unspecified severity, with agitation: Secondary | ICD-10-CM | POA: Diagnosis not present

## 2023-03-25 DIAGNOSIS — F0282 Dementia in other diseases classified elsewhere, unspecified severity, with psychotic disturbance: Secondary | ICD-10-CM | POA: Diagnosis not present

## 2023-03-26 DIAGNOSIS — F02811 Dementia in other diseases classified elsewhere, unspecified severity, with agitation: Secondary | ICD-10-CM | POA: Diagnosis not present

## 2023-03-26 DIAGNOSIS — F0282 Dementia in other diseases classified elsewhere, unspecified severity, with psychotic disturbance: Secondary | ICD-10-CM | POA: Diagnosis not present

## 2023-03-26 DIAGNOSIS — I69354 Hemiplegia and hemiparesis following cerebral infarction affecting left non-dominant side: Secondary | ICD-10-CM | POA: Diagnosis not present

## 2023-03-26 DIAGNOSIS — E43 Unspecified severe protein-calorie malnutrition: Secondary | ICD-10-CM | POA: Diagnosis not present

## 2023-03-26 DIAGNOSIS — G309 Alzheimer's disease, unspecified: Secondary | ICD-10-CM | POA: Diagnosis not present

## 2023-03-26 DIAGNOSIS — I10 Essential (primary) hypertension: Secondary | ICD-10-CM | POA: Diagnosis not present

## 2023-03-27 DIAGNOSIS — E43 Unspecified severe protein-calorie malnutrition: Secondary | ICD-10-CM | POA: Diagnosis not present

## 2023-03-27 DIAGNOSIS — F02811 Dementia in other diseases classified elsewhere, unspecified severity, with agitation: Secondary | ICD-10-CM | POA: Diagnosis not present

## 2023-03-27 DIAGNOSIS — I69354 Hemiplegia and hemiparesis following cerebral infarction affecting left non-dominant side: Secondary | ICD-10-CM | POA: Diagnosis not present

## 2023-03-27 DIAGNOSIS — I10 Essential (primary) hypertension: Secondary | ICD-10-CM | POA: Diagnosis not present

## 2023-03-27 DIAGNOSIS — F0282 Dementia in other diseases classified elsewhere, unspecified severity, with psychotic disturbance: Secondary | ICD-10-CM | POA: Diagnosis not present

## 2023-03-27 DIAGNOSIS — G309 Alzheimer's disease, unspecified: Secondary | ICD-10-CM | POA: Diagnosis not present

## 2023-03-28 DIAGNOSIS — F02811 Dementia in other diseases classified elsewhere, unspecified severity, with agitation: Secondary | ICD-10-CM | POA: Diagnosis not present

## 2023-03-28 DIAGNOSIS — F0282 Dementia in other diseases classified elsewhere, unspecified severity, with psychotic disturbance: Secondary | ICD-10-CM | POA: Diagnosis not present

## 2023-03-28 DIAGNOSIS — I69354 Hemiplegia and hemiparesis following cerebral infarction affecting left non-dominant side: Secondary | ICD-10-CM | POA: Diagnosis not present

## 2023-03-28 DIAGNOSIS — G309 Alzheimer's disease, unspecified: Secondary | ICD-10-CM | POA: Diagnosis not present

## 2023-03-28 DIAGNOSIS — E43 Unspecified severe protein-calorie malnutrition: Secondary | ICD-10-CM | POA: Diagnosis not present

## 2023-03-28 DIAGNOSIS — I10 Essential (primary) hypertension: Secondary | ICD-10-CM | POA: Diagnosis not present

## 2023-03-29 DIAGNOSIS — F0282 Dementia in other diseases classified elsewhere, unspecified severity, with psychotic disturbance: Secondary | ICD-10-CM | POA: Diagnosis not present

## 2023-03-29 DIAGNOSIS — I10 Essential (primary) hypertension: Secondary | ICD-10-CM | POA: Diagnosis not present

## 2023-03-29 DIAGNOSIS — I69354 Hemiplegia and hemiparesis following cerebral infarction affecting left non-dominant side: Secondary | ICD-10-CM | POA: Diagnosis not present

## 2023-03-29 DIAGNOSIS — F02811 Dementia in other diseases classified elsewhere, unspecified severity, with agitation: Secondary | ICD-10-CM | POA: Diagnosis not present

## 2023-03-29 DIAGNOSIS — E43 Unspecified severe protein-calorie malnutrition: Secondary | ICD-10-CM | POA: Diagnosis not present

## 2023-03-29 DIAGNOSIS — G309 Alzheimer's disease, unspecified: Secondary | ICD-10-CM | POA: Diagnosis not present

## 2023-03-30 DIAGNOSIS — I10 Essential (primary) hypertension: Secondary | ICD-10-CM | POA: Diagnosis not present

## 2023-03-30 DIAGNOSIS — F02811 Dementia in other diseases classified elsewhere, unspecified severity, with agitation: Secondary | ICD-10-CM | POA: Diagnosis not present

## 2023-03-30 DIAGNOSIS — G309 Alzheimer's disease, unspecified: Secondary | ICD-10-CM | POA: Diagnosis not present

## 2023-03-30 DIAGNOSIS — E43 Unspecified severe protein-calorie malnutrition: Secondary | ICD-10-CM | POA: Diagnosis not present

## 2023-03-30 DIAGNOSIS — F0282 Dementia in other diseases classified elsewhere, unspecified severity, with psychotic disturbance: Secondary | ICD-10-CM | POA: Diagnosis not present

## 2023-03-30 DIAGNOSIS — I69354 Hemiplegia and hemiparesis following cerebral infarction affecting left non-dominant side: Secondary | ICD-10-CM | POA: Diagnosis not present

## 2023-03-31 DIAGNOSIS — I10 Essential (primary) hypertension: Secondary | ICD-10-CM | POA: Diagnosis not present

## 2023-03-31 DIAGNOSIS — G309 Alzheimer's disease, unspecified: Secondary | ICD-10-CM | POA: Diagnosis not present

## 2023-03-31 DIAGNOSIS — E43 Unspecified severe protein-calorie malnutrition: Secondary | ICD-10-CM | POA: Diagnosis not present

## 2023-03-31 DIAGNOSIS — F02811 Dementia in other diseases classified elsewhere, unspecified severity, with agitation: Secondary | ICD-10-CM | POA: Diagnosis not present

## 2023-03-31 DIAGNOSIS — I69354 Hemiplegia and hemiparesis following cerebral infarction affecting left non-dominant side: Secondary | ICD-10-CM | POA: Diagnosis not present

## 2023-03-31 DIAGNOSIS — F0282 Dementia in other diseases classified elsewhere, unspecified severity, with psychotic disturbance: Secondary | ICD-10-CM | POA: Diagnosis not present

## 2023-04-01 DIAGNOSIS — I69354 Hemiplegia and hemiparesis following cerebral infarction affecting left non-dominant side: Secondary | ICD-10-CM | POA: Diagnosis not present

## 2023-04-01 DIAGNOSIS — I10 Essential (primary) hypertension: Secondary | ICD-10-CM | POA: Diagnosis not present

## 2023-04-01 DIAGNOSIS — G309 Alzheimer's disease, unspecified: Secondary | ICD-10-CM | POA: Diagnosis not present

## 2023-04-01 DIAGNOSIS — E43 Unspecified severe protein-calorie malnutrition: Secondary | ICD-10-CM | POA: Diagnosis not present

## 2023-04-01 DIAGNOSIS — F02811 Dementia in other diseases classified elsewhere, unspecified severity, with agitation: Secondary | ICD-10-CM | POA: Diagnosis not present

## 2023-04-01 DIAGNOSIS — F0282 Dementia in other diseases classified elsewhere, unspecified severity, with psychotic disturbance: Secondary | ICD-10-CM | POA: Diagnosis not present

## 2023-04-02 DIAGNOSIS — G309 Alzheimer's disease, unspecified: Secondary | ICD-10-CM | POA: Diagnosis not present

## 2023-04-02 DIAGNOSIS — E43 Unspecified severe protein-calorie malnutrition: Secondary | ICD-10-CM | POA: Diagnosis not present

## 2023-04-02 DIAGNOSIS — I69354 Hemiplegia and hemiparesis following cerebral infarction affecting left non-dominant side: Secondary | ICD-10-CM | POA: Diagnosis not present

## 2023-04-02 DIAGNOSIS — I10 Essential (primary) hypertension: Secondary | ICD-10-CM | POA: Diagnosis not present

## 2023-04-02 DIAGNOSIS — F0282 Dementia in other diseases classified elsewhere, unspecified severity, with psychotic disturbance: Secondary | ICD-10-CM | POA: Diagnosis not present

## 2023-04-02 DIAGNOSIS — F02811 Dementia in other diseases classified elsewhere, unspecified severity, with agitation: Secondary | ICD-10-CM | POA: Diagnosis not present

## 2023-04-03 DIAGNOSIS — F02811 Dementia in other diseases classified elsewhere, unspecified severity, with agitation: Secondary | ICD-10-CM | POA: Diagnosis not present

## 2023-04-03 DIAGNOSIS — I69354 Hemiplegia and hemiparesis following cerebral infarction affecting left non-dominant side: Secondary | ICD-10-CM | POA: Diagnosis not present

## 2023-04-03 DIAGNOSIS — F0282 Dementia in other diseases classified elsewhere, unspecified severity, with psychotic disturbance: Secondary | ICD-10-CM | POA: Diagnosis not present

## 2023-04-03 DIAGNOSIS — E43 Unspecified severe protein-calorie malnutrition: Secondary | ICD-10-CM | POA: Diagnosis not present

## 2023-04-03 DIAGNOSIS — I10 Essential (primary) hypertension: Secondary | ICD-10-CM | POA: Diagnosis not present

## 2023-04-03 DIAGNOSIS — G309 Alzheimer's disease, unspecified: Secondary | ICD-10-CM | POA: Diagnosis not present

## 2023-04-04 DIAGNOSIS — E43 Unspecified severe protein-calorie malnutrition: Secondary | ICD-10-CM | POA: Diagnosis not present

## 2023-04-04 DIAGNOSIS — I69354 Hemiplegia and hemiparesis following cerebral infarction affecting left non-dominant side: Secondary | ICD-10-CM | POA: Diagnosis not present

## 2023-04-04 DIAGNOSIS — G309 Alzheimer's disease, unspecified: Secondary | ICD-10-CM | POA: Diagnosis not present

## 2023-04-04 DIAGNOSIS — F0282 Dementia in other diseases classified elsewhere, unspecified severity, with psychotic disturbance: Secondary | ICD-10-CM | POA: Diagnosis not present

## 2023-04-04 DIAGNOSIS — I10 Essential (primary) hypertension: Secondary | ICD-10-CM | POA: Diagnosis not present

## 2023-04-04 DIAGNOSIS — F02811 Dementia in other diseases classified elsewhere, unspecified severity, with agitation: Secondary | ICD-10-CM | POA: Diagnosis not present

## 2023-04-05 DIAGNOSIS — F0282 Dementia in other diseases classified elsewhere, unspecified severity, with psychotic disturbance: Secondary | ICD-10-CM | POA: Diagnosis not present

## 2023-04-05 DIAGNOSIS — E43 Unspecified severe protein-calorie malnutrition: Secondary | ICD-10-CM | POA: Diagnosis not present

## 2023-04-05 DIAGNOSIS — I10 Essential (primary) hypertension: Secondary | ICD-10-CM | POA: Diagnosis not present

## 2023-04-05 DIAGNOSIS — F02811 Dementia in other diseases classified elsewhere, unspecified severity, with agitation: Secondary | ICD-10-CM | POA: Diagnosis not present

## 2023-04-05 DIAGNOSIS — I69354 Hemiplegia and hemiparesis following cerebral infarction affecting left non-dominant side: Secondary | ICD-10-CM | POA: Diagnosis not present

## 2023-04-05 DIAGNOSIS — G309 Alzheimer's disease, unspecified: Secondary | ICD-10-CM | POA: Diagnosis not present

## 2023-04-06 DIAGNOSIS — F0282 Dementia in other diseases classified elsewhere, unspecified severity, with psychotic disturbance: Secondary | ICD-10-CM | POA: Diagnosis not present

## 2023-04-06 DIAGNOSIS — E43 Unspecified severe protein-calorie malnutrition: Secondary | ICD-10-CM | POA: Diagnosis not present

## 2023-04-06 DIAGNOSIS — I10 Essential (primary) hypertension: Secondary | ICD-10-CM | POA: Diagnosis not present

## 2023-04-06 DIAGNOSIS — G309 Alzheimer's disease, unspecified: Secondary | ICD-10-CM | POA: Diagnosis not present

## 2023-04-06 DIAGNOSIS — F02811 Dementia in other diseases classified elsewhere, unspecified severity, with agitation: Secondary | ICD-10-CM | POA: Diagnosis not present

## 2023-04-06 DIAGNOSIS — I69354 Hemiplegia and hemiparesis following cerebral infarction affecting left non-dominant side: Secondary | ICD-10-CM | POA: Diagnosis not present

## 2023-04-07 DIAGNOSIS — I10 Essential (primary) hypertension: Secondary | ICD-10-CM | POA: Diagnosis not present

## 2023-04-07 DIAGNOSIS — F0282 Dementia in other diseases classified elsewhere, unspecified severity, with psychotic disturbance: Secondary | ICD-10-CM | POA: Diagnosis not present

## 2023-04-07 DIAGNOSIS — I69354 Hemiplegia and hemiparesis following cerebral infarction affecting left non-dominant side: Secondary | ICD-10-CM | POA: Diagnosis not present

## 2023-04-07 DIAGNOSIS — E43 Unspecified severe protein-calorie malnutrition: Secondary | ICD-10-CM | POA: Diagnosis not present

## 2023-04-07 DIAGNOSIS — G309 Alzheimer's disease, unspecified: Secondary | ICD-10-CM | POA: Diagnosis not present

## 2023-04-07 DIAGNOSIS — F02811 Dementia in other diseases classified elsewhere, unspecified severity, with agitation: Secondary | ICD-10-CM | POA: Diagnosis not present

## 2023-04-08 DIAGNOSIS — I10 Essential (primary) hypertension: Secondary | ICD-10-CM | POA: Diagnosis not present

## 2023-04-08 DIAGNOSIS — G309 Alzheimer's disease, unspecified: Secondary | ICD-10-CM | POA: Diagnosis not present

## 2023-04-08 DIAGNOSIS — I69354 Hemiplegia and hemiparesis following cerebral infarction affecting left non-dominant side: Secondary | ICD-10-CM | POA: Diagnosis not present

## 2023-04-08 DIAGNOSIS — E43 Unspecified severe protein-calorie malnutrition: Secondary | ICD-10-CM | POA: Diagnosis not present

## 2023-04-08 DIAGNOSIS — F0282 Dementia in other diseases classified elsewhere, unspecified severity, with psychotic disturbance: Secondary | ICD-10-CM | POA: Diagnosis not present

## 2023-04-08 DIAGNOSIS — F02811 Dementia in other diseases classified elsewhere, unspecified severity, with agitation: Secondary | ICD-10-CM | POA: Diagnosis not present

## 2023-04-09 DIAGNOSIS — G309 Alzheimer's disease, unspecified: Secondary | ICD-10-CM | POA: Diagnosis not present

## 2023-04-09 DIAGNOSIS — E43 Unspecified severe protein-calorie malnutrition: Secondary | ICD-10-CM | POA: Diagnosis not present

## 2023-04-09 DIAGNOSIS — R634 Abnormal weight loss: Secondary | ICD-10-CM | POA: Diagnosis not present

## 2023-04-09 DIAGNOSIS — L89152 Pressure ulcer of sacral region, stage 2: Secondary | ICD-10-CM | POA: Diagnosis not present

## 2023-04-09 DIAGNOSIS — I69354 Hemiplegia and hemiparesis following cerebral infarction affecting left non-dominant side: Secondary | ICD-10-CM | POA: Diagnosis not present

## 2023-04-09 DIAGNOSIS — F0282 Dementia in other diseases classified elsewhere, unspecified severity, with psychotic disturbance: Secondary | ICD-10-CM | POA: Diagnosis not present

## 2023-04-09 DIAGNOSIS — R64 Cachexia: Secondary | ICD-10-CM | POA: Diagnosis not present

## 2023-04-09 DIAGNOSIS — R159 Full incontinence of feces: Secondary | ICD-10-CM | POA: Diagnosis not present

## 2023-04-09 DIAGNOSIS — F02811 Dementia in other diseases classified elsewhere, unspecified severity, with agitation: Secondary | ICD-10-CM | POA: Diagnosis not present

## 2023-04-09 DIAGNOSIS — R32 Unspecified urinary incontinence: Secondary | ICD-10-CM | POA: Diagnosis not present

## 2023-04-09 DIAGNOSIS — I10 Essential (primary) hypertension: Secondary | ICD-10-CM | POA: Diagnosis not present

## 2023-04-09 DIAGNOSIS — Z853 Personal history of malignant neoplasm of breast: Secondary | ICD-10-CM | POA: Diagnosis not present

## 2023-04-12 DIAGNOSIS — I69354 Hemiplegia and hemiparesis following cerebral infarction affecting left non-dominant side: Secondary | ICD-10-CM | POA: Diagnosis not present

## 2023-04-12 DIAGNOSIS — F0282 Dementia in other diseases classified elsewhere, unspecified severity, with psychotic disturbance: Secondary | ICD-10-CM | POA: Diagnosis not present

## 2023-04-12 DIAGNOSIS — G309 Alzheimer's disease, unspecified: Secondary | ICD-10-CM | POA: Diagnosis not present

## 2023-04-12 DIAGNOSIS — F02811 Dementia in other diseases classified elsewhere, unspecified severity, with agitation: Secondary | ICD-10-CM | POA: Diagnosis not present

## 2023-04-12 DIAGNOSIS — I10 Essential (primary) hypertension: Secondary | ICD-10-CM | POA: Diagnosis not present

## 2023-04-12 DIAGNOSIS — E43 Unspecified severe protein-calorie malnutrition: Secondary | ICD-10-CM | POA: Diagnosis not present

## 2023-04-16 DIAGNOSIS — I69354 Hemiplegia and hemiparesis following cerebral infarction affecting left non-dominant side: Secondary | ICD-10-CM | POA: Diagnosis not present

## 2023-04-16 DIAGNOSIS — F02811 Dementia in other diseases classified elsewhere, unspecified severity, with agitation: Secondary | ICD-10-CM | POA: Diagnosis not present

## 2023-04-16 DIAGNOSIS — E43 Unspecified severe protein-calorie malnutrition: Secondary | ICD-10-CM | POA: Diagnosis not present

## 2023-04-16 DIAGNOSIS — G309 Alzheimer's disease, unspecified: Secondary | ICD-10-CM | POA: Diagnosis not present

## 2023-04-16 DIAGNOSIS — F0282 Dementia in other diseases classified elsewhere, unspecified severity, with psychotic disturbance: Secondary | ICD-10-CM | POA: Diagnosis not present

## 2023-04-16 DIAGNOSIS — I10 Essential (primary) hypertension: Secondary | ICD-10-CM | POA: Diagnosis not present

## 2023-04-19 DIAGNOSIS — F0282 Dementia in other diseases classified elsewhere, unspecified severity, with psychotic disturbance: Secondary | ICD-10-CM | POA: Diagnosis not present

## 2023-04-19 DIAGNOSIS — F02811 Dementia in other diseases classified elsewhere, unspecified severity, with agitation: Secondary | ICD-10-CM | POA: Diagnosis not present

## 2023-04-19 DIAGNOSIS — I69354 Hemiplegia and hemiparesis following cerebral infarction affecting left non-dominant side: Secondary | ICD-10-CM | POA: Diagnosis not present

## 2023-04-19 DIAGNOSIS — I10 Essential (primary) hypertension: Secondary | ICD-10-CM | POA: Diagnosis not present

## 2023-04-19 DIAGNOSIS — G309 Alzheimer's disease, unspecified: Secondary | ICD-10-CM | POA: Diagnosis not present

## 2023-04-19 DIAGNOSIS — E43 Unspecified severe protein-calorie malnutrition: Secondary | ICD-10-CM | POA: Diagnosis not present

## 2023-04-23 DIAGNOSIS — F02811 Dementia in other diseases classified elsewhere, unspecified severity, with agitation: Secondary | ICD-10-CM | POA: Diagnosis not present

## 2023-04-23 DIAGNOSIS — I10 Essential (primary) hypertension: Secondary | ICD-10-CM | POA: Diagnosis not present

## 2023-04-23 DIAGNOSIS — G309 Alzheimer's disease, unspecified: Secondary | ICD-10-CM | POA: Diagnosis not present

## 2023-04-23 DIAGNOSIS — F0282 Dementia in other diseases classified elsewhere, unspecified severity, with psychotic disturbance: Secondary | ICD-10-CM | POA: Diagnosis not present

## 2023-04-23 DIAGNOSIS — I69354 Hemiplegia and hemiparesis following cerebral infarction affecting left non-dominant side: Secondary | ICD-10-CM | POA: Diagnosis not present

## 2023-04-23 DIAGNOSIS — E43 Unspecified severe protein-calorie malnutrition: Secondary | ICD-10-CM | POA: Diagnosis not present

## 2023-04-27 DIAGNOSIS — E43 Unspecified severe protein-calorie malnutrition: Secondary | ICD-10-CM | POA: Diagnosis not present

## 2023-04-27 DIAGNOSIS — F0282 Dementia in other diseases classified elsewhere, unspecified severity, with psychotic disturbance: Secondary | ICD-10-CM | POA: Diagnosis not present

## 2023-04-27 DIAGNOSIS — F02811 Dementia in other diseases classified elsewhere, unspecified severity, with agitation: Secondary | ICD-10-CM | POA: Diagnosis not present

## 2023-04-27 DIAGNOSIS — G309 Alzheimer's disease, unspecified: Secondary | ICD-10-CM | POA: Diagnosis not present

## 2023-04-27 DIAGNOSIS — I10 Essential (primary) hypertension: Secondary | ICD-10-CM | POA: Diagnosis not present

## 2023-04-27 DIAGNOSIS — I69354 Hemiplegia and hemiparesis following cerebral infarction affecting left non-dominant side: Secondary | ICD-10-CM | POA: Diagnosis not present

## 2023-04-30 DIAGNOSIS — I69354 Hemiplegia and hemiparesis following cerebral infarction affecting left non-dominant side: Secondary | ICD-10-CM | POA: Diagnosis not present

## 2023-04-30 DIAGNOSIS — F02811 Dementia in other diseases classified elsewhere, unspecified severity, with agitation: Secondary | ICD-10-CM | POA: Diagnosis not present

## 2023-04-30 DIAGNOSIS — G309 Alzheimer's disease, unspecified: Secondary | ICD-10-CM | POA: Diagnosis not present

## 2023-04-30 DIAGNOSIS — F0282 Dementia in other diseases classified elsewhere, unspecified severity, with psychotic disturbance: Secondary | ICD-10-CM | POA: Diagnosis not present

## 2023-04-30 DIAGNOSIS — I10 Essential (primary) hypertension: Secondary | ICD-10-CM | POA: Diagnosis not present

## 2023-04-30 DIAGNOSIS — E43 Unspecified severe protein-calorie malnutrition: Secondary | ICD-10-CM | POA: Diagnosis not present

## 2023-05-03 DIAGNOSIS — I69354 Hemiplegia and hemiparesis following cerebral infarction affecting left non-dominant side: Secondary | ICD-10-CM | POA: Diagnosis not present

## 2023-05-03 DIAGNOSIS — I10 Essential (primary) hypertension: Secondary | ICD-10-CM | POA: Diagnosis not present

## 2023-05-03 DIAGNOSIS — F02811 Dementia in other diseases classified elsewhere, unspecified severity, with agitation: Secondary | ICD-10-CM | POA: Diagnosis not present

## 2023-05-03 DIAGNOSIS — G309 Alzheimer's disease, unspecified: Secondary | ICD-10-CM | POA: Diagnosis not present

## 2023-05-03 DIAGNOSIS — F0282 Dementia in other diseases classified elsewhere, unspecified severity, with psychotic disturbance: Secondary | ICD-10-CM | POA: Diagnosis not present

## 2023-05-03 DIAGNOSIS — E43 Unspecified severe protein-calorie malnutrition: Secondary | ICD-10-CM | POA: Diagnosis not present

## 2023-05-07 DIAGNOSIS — G309 Alzheimer's disease, unspecified: Secondary | ICD-10-CM | POA: Diagnosis not present

## 2023-05-07 DIAGNOSIS — I10 Essential (primary) hypertension: Secondary | ICD-10-CM | POA: Diagnosis not present

## 2023-05-07 DIAGNOSIS — E43 Unspecified severe protein-calorie malnutrition: Secondary | ICD-10-CM | POA: Diagnosis not present

## 2023-05-07 DIAGNOSIS — F02811 Dementia in other diseases classified elsewhere, unspecified severity, with agitation: Secondary | ICD-10-CM | POA: Diagnosis not present

## 2023-05-07 DIAGNOSIS — I69354 Hemiplegia and hemiparesis following cerebral infarction affecting left non-dominant side: Secondary | ICD-10-CM | POA: Diagnosis not present

## 2023-05-07 DIAGNOSIS — F0282 Dementia in other diseases classified elsewhere, unspecified severity, with psychotic disturbance: Secondary | ICD-10-CM | POA: Diagnosis not present

## 2023-05-09 DIAGNOSIS — I69354 Hemiplegia and hemiparesis following cerebral infarction affecting left non-dominant side: Secondary | ICD-10-CM | POA: Diagnosis not present

## 2023-05-09 DIAGNOSIS — I10 Essential (primary) hypertension: Secondary | ICD-10-CM | POA: Diagnosis not present

## 2023-05-09 DIAGNOSIS — F0282 Dementia in other diseases classified elsewhere, unspecified severity, with psychotic disturbance: Secondary | ICD-10-CM | POA: Diagnosis not present

## 2023-05-09 DIAGNOSIS — R159 Full incontinence of feces: Secondary | ICD-10-CM | POA: Diagnosis not present

## 2023-05-09 DIAGNOSIS — L89152 Pressure ulcer of sacral region, stage 2: Secondary | ICD-10-CM | POA: Diagnosis not present

## 2023-05-09 DIAGNOSIS — R634 Abnormal weight loss: Secondary | ICD-10-CM | POA: Diagnosis not present

## 2023-05-09 DIAGNOSIS — Z853 Personal history of malignant neoplasm of breast: Secondary | ICD-10-CM | POA: Diagnosis not present

## 2023-05-09 DIAGNOSIS — R32 Unspecified urinary incontinence: Secondary | ICD-10-CM | POA: Diagnosis not present

## 2023-05-09 DIAGNOSIS — E43 Unspecified severe protein-calorie malnutrition: Secondary | ICD-10-CM | POA: Diagnosis not present

## 2023-05-09 DIAGNOSIS — F02811 Dementia in other diseases classified elsewhere, unspecified severity, with agitation: Secondary | ICD-10-CM | POA: Diagnosis not present

## 2023-05-09 DIAGNOSIS — G309 Alzheimer's disease, unspecified: Secondary | ICD-10-CM | POA: Diagnosis not present

## 2023-05-09 DIAGNOSIS — R64 Cachexia: Secondary | ICD-10-CM | POA: Diagnosis not present

## 2023-05-10 DIAGNOSIS — I69354 Hemiplegia and hemiparesis following cerebral infarction affecting left non-dominant side: Secondary | ICD-10-CM | POA: Diagnosis not present

## 2023-05-10 DIAGNOSIS — F02811 Dementia in other diseases classified elsewhere, unspecified severity, with agitation: Secondary | ICD-10-CM | POA: Diagnosis not present

## 2023-05-10 DIAGNOSIS — G309 Alzheimer's disease, unspecified: Secondary | ICD-10-CM | POA: Diagnosis not present

## 2023-05-10 DIAGNOSIS — E43 Unspecified severe protein-calorie malnutrition: Secondary | ICD-10-CM | POA: Diagnosis not present

## 2023-05-10 DIAGNOSIS — I10 Essential (primary) hypertension: Secondary | ICD-10-CM | POA: Diagnosis not present

## 2023-05-10 DIAGNOSIS — F0282 Dementia in other diseases classified elsewhere, unspecified severity, with psychotic disturbance: Secondary | ICD-10-CM | POA: Diagnosis not present

## 2023-05-14 DIAGNOSIS — E43 Unspecified severe protein-calorie malnutrition: Secondary | ICD-10-CM | POA: Diagnosis not present

## 2023-05-14 DIAGNOSIS — F02811 Dementia in other diseases classified elsewhere, unspecified severity, with agitation: Secondary | ICD-10-CM | POA: Diagnosis not present

## 2023-05-14 DIAGNOSIS — F0282 Dementia in other diseases classified elsewhere, unspecified severity, with psychotic disturbance: Secondary | ICD-10-CM | POA: Diagnosis not present

## 2023-05-14 DIAGNOSIS — I10 Essential (primary) hypertension: Secondary | ICD-10-CM | POA: Diagnosis not present

## 2023-05-14 DIAGNOSIS — G309 Alzheimer's disease, unspecified: Secondary | ICD-10-CM | POA: Diagnosis not present

## 2023-05-14 DIAGNOSIS — I69354 Hemiplegia and hemiparesis following cerebral infarction affecting left non-dominant side: Secondary | ICD-10-CM | POA: Diagnosis not present

## 2023-05-17 DIAGNOSIS — G309 Alzheimer's disease, unspecified: Secondary | ICD-10-CM | POA: Diagnosis not present

## 2023-05-17 DIAGNOSIS — I10 Essential (primary) hypertension: Secondary | ICD-10-CM | POA: Diagnosis not present

## 2023-05-17 DIAGNOSIS — I69354 Hemiplegia and hemiparesis following cerebral infarction affecting left non-dominant side: Secondary | ICD-10-CM | POA: Diagnosis not present

## 2023-05-17 DIAGNOSIS — F02811 Dementia in other diseases classified elsewhere, unspecified severity, with agitation: Secondary | ICD-10-CM | POA: Diagnosis not present

## 2023-05-17 DIAGNOSIS — E43 Unspecified severe protein-calorie malnutrition: Secondary | ICD-10-CM | POA: Diagnosis not present

## 2023-05-17 DIAGNOSIS — F0282 Dementia in other diseases classified elsewhere, unspecified severity, with psychotic disturbance: Secondary | ICD-10-CM | POA: Diagnosis not present

## 2023-05-21 DIAGNOSIS — F02811 Dementia in other diseases classified elsewhere, unspecified severity, with agitation: Secondary | ICD-10-CM | POA: Diagnosis not present

## 2023-05-21 DIAGNOSIS — F0282 Dementia in other diseases classified elsewhere, unspecified severity, with psychotic disturbance: Secondary | ICD-10-CM | POA: Diagnosis not present

## 2023-05-21 DIAGNOSIS — G309 Alzheimer's disease, unspecified: Secondary | ICD-10-CM | POA: Diagnosis not present

## 2023-05-21 DIAGNOSIS — I69354 Hemiplegia and hemiparesis following cerebral infarction affecting left non-dominant side: Secondary | ICD-10-CM | POA: Diagnosis not present

## 2023-05-21 DIAGNOSIS — I10 Essential (primary) hypertension: Secondary | ICD-10-CM | POA: Diagnosis not present

## 2023-05-21 DIAGNOSIS — E43 Unspecified severe protein-calorie malnutrition: Secondary | ICD-10-CM | POA: Diagnosis not present

## 2023-05-24 DIAGNOSIS — G309 Alzheimer's disease, unspecified: Secondary | ICD-10-CM | POA: Diagnosis not present

## 2023-05-24 DIAGNOSIS — F0282 Dementia in other diseases classified elsewhere, unspecified severity, with psychotic disturbance: Secondary | ICD-10-CM | POA: Diagnosis not present

## 2023-05-24 DIAGNOSIS — I10 Essential (primary) hypertension: Secondary | ICD-10-CM | POA: Diagnosis not present

## 2023-05-24 DIAGNOSIS — E43 Unspecified severe protein-calorie malnutrition: Secondary | ICD-10-CM | POA: Diagnosis not present

## 2023-05-24 DIAGNOSIS — I69354 Hemiplegia and hemiparesis following cerebral infarction affecting left non-dominant side: Secondary | ICD-10-CM | POA: Diagnosis not present

## 2023-05-24 DIAGNOSIS — F02811 Dementia in other diseases classified elsewhere, unspecified severity, with agitation: Secondary | ICD-10-CM | POA: Diagnosis not present

## 2023-05-28 DIAGNOSIS — E43 Unspecified severe protein-calorie malnutrition: Secondary | ICD-10-CM | POA: Diagnosis not present

## 2023-05-28 DIAGNOSIS — G309 Alzheimer's disease, unspecified: Secondary | ICD-10-CM | POA: Diagnosis not present

## 2023-05-28 DIAGNOSIS — F02811 Dementia in other diseases classified elsewhere, unspecified severity, with agitation: Secondary | ICD-10-CM | POA: Diagnosis not present

## 2023-05-28 DIAGNOSIS — F0282 Dementia in other diseases classified elsewhere, unspecified severity, with psychotic disturbance: Secondary | ICD-10-CM | POA: Diagnosis not present

## 2023-05-28 DIAGNOSIS — I69354 Hemiplegia and hemiparesis following cerebral infarction affecting left non-dominant side: Secondary | ICD-10-CM | POA: Diagnosis not present

## 2023-05-28 DIAGNOSIS — I10 Essential (primary) hypertension: Secondary | ICD-10-CM | POA: Diagnosis not present

## 2023-05-31 DIAGNOSIS — F0282 Dementia in other diseases classified elsewhere, unspecified severity, with psychotic disturbance: Secondary | ICD-10-CM | POA: Diagnosis not present

## 2023-05-31 DIAGNOSIS — I10 Essential (primary) hypertension: Secondary | ICD-10-CM | POA: Diagnosis not present

## 2023-05-31 DIAGNOSIS — I69354 Hemiplegia and hemiparesis following cerebral infarction affecting left non-dominant side: Secondary | ICD-10-CM | POA: Diagnosis not present

## 2023-05-31 DIAGNOSIS — E43 Unspecified severe protein-calorie malnutrition: Secondary | ICD-10-CM | POA: Diagnosis not present

## 2023-05-31 DIAGNOSIS — G309 Alzheimer's disease, unspecified: Secondary | ICD-10-CM | POA: Diagnosis not present

## 2023-05-31 DIAGNOSIS — F02811 Dementia in other diseases classified elsewhere, unspecified severity, with agitation: Secondary | ICD-10-CM | POA: Diagnosis not present

## 2023-06-04 DIAGNOSIS — F02811 Dementia in other diseases classified elsewhere, unspecified severity, with agitation: Secondary | ICD-10-CM | POA: Diagnosis not present

## 2023-06-04 DIAGNOSIS — I10 Essential (primary) hypertension: Secondary | ICD-10-CM | POA: Diagnosis not present

## 2023-06-04 DIAGNOSIS — F0282 Dementia in other diseases classified elsewhere, unspecified severity, with psychotic disturbance: Secondary | ICD-10-CM | POA: Diagnosis not present

## 2023-06-04 DIAGNOSIS — E43 Unspecified severe protein-calorie malnutrition: Secondary | ICD-10-CM | POA: Diagnosis not present

## 2023-06-04 DIAGNOSIS — I69354 Hemiplegia and hemiparesis following cerebral infarction affecting left non-dominant side: Secondary | ICD-10-CM | POA: Diagnosis not present

## 2023-06-04 DIAGNOSIS — G309 Alzheimer's disease, unspecified: Secondary | ICD-10-CM | POA: Diagnosis not present

## 2023-06-07 DIAGNOSIS — I69354 Hemiplegia and hemiparesis following cerebral infarction affecting left non-dominant side: Secondary | ICD-10-CM | POA: Diagnosis not present

## 2023-06-07 DIAGNOSIS — F02811 Dementia in other diseases classified elsewhere, unspecified severity, with agitation: Secondary | ICD-10-CM | POA: Diagnosis not present

## 2023-06-07 DIAGNOSIS — E43 Unspecified severe protein-calorie malnutrition: Secondary | ICD-10-CM | POA: Diagnosis not present

## 2023-06-07 DIAGNOSIS — F0282 Dementia in other diseases classified elsewhere, unspecified severity, with psychotic disturbance: Secondary | ICD-10-CM | POA: Diagnosis not present

## 2023-06-07 DIAGNOSIS — G309 Alzheimer's disease, unspecified: Secondary | ICD-10-CM | POA: Diagnosis not present

## 2023-06-07 DIAGNOSIS — I10 Essential (primary) hypertension: Secondary | ICD-10-CM | POA: Diagnosis not present
